# Patient Record
Sex: Male | Born: 1955
Health system: Southern US, Community
[De-identification: ages and names within clinical notes are randomized; demographics above are authoritative.]

## PROBLEM LIST (undated history)

## (undated) DIAGNOSIS — E538 Deficiency of other specified B group vitamins: Secondary | ICD-10-CM

## (undated) DIAGNOSIS — Z992 Dependence on renal dialysis: Secondary | ICD-10-CM

## (undated) DIAGNOSIS — E559 Vitamin D deficiency, unspecified: Secondary | ICD-10-CM

## (undated) DIAGNOSIS — R112 Nausea with vomiting, unspecified: Secondary | ICD-10-CM

## (undated) DIAGNOSIS — D649 Anemia, unspecified: Secondary | ICD-10-CM

## (undated) DIAGNOSIS — Z9889 Other specified postprocedural states: Secondary | ICD-10-CM

## (undated) DIAGNOSIS — N186 End stage renal disease: Secondary | ICD-10-CM

## (undated) DIAGNOSIS — R251 Tremor, unspecified: Secondary | ICD-10-CM

## (undated) DIAGNOSIS — M5136 Other intervertebral disc degeneration, lumbar region: Secondary | ICD-10-CM

## (undated) DIAGNOSIS — E7800 Pure hypercholesterolemia, unspecified: Secondary | ICD-10-CM

## (undated) DIAGNOSIS — K649 Unspecified hemorrhoids: Secondary | ICD-10-CM

## (undated) DIAGNOSIS — M1990 Unspecified osteoarthritis, unspecified site: Secondary | ICD-10-CM

## (undated) DIAGNOSIS — G0490 Encephalitis and encephalomyelitis, unspecified: Secondary | ICD-10-CM

## (undated) DIAGNOSIS — I10 Essential (primary) hypertension: Secondary | ICD-10-CM

## (undated) DIAGNOSIS — K219 Gastro-esophageal reflux disease without esophagitis: Secondary | ICD-10-CM

## (undated) DIAGNOSIS — D126 Benign neoplasm of colon, unspecified: Secondary | ICD-10-CM

## (undated) DIAGNOSIS — M51369 Other intervertebral disc degeneration, lumbar region without mention of lumbar back pain or lower extremity pain: Secondary | ICD-10-CM

## (undated) DIAGNOSIS — N189 Chronic kidney disease, unspecified: Secondary | ICD-10-CM

## (undated) DIAGNOSIS — E875 Hyperkalemia: Secondary | ICD-10-CM

## (undated) DIAGNOSIS — G809 Cerebral palsy, unspecified: Secondary | ICD-10-CM

## (undated) HISTORY — PX: COLONOSCOPY: SHX174

## (undated) HISTORY — PX: FRACTURE SURGERY: SHX138

## (undated) HISTORY — PX: ESOPHAGOGASTRODUODENOSCOPY: SHX1529

---

## 1995-05-28 HISTORY — PX: FRACTURE SURGERY: SHX138

## 2010-04-13 ENCOUNTER — Ambulatory Visit: Payer: Self-pay | Admitting: Unknown Physician Specialty

## 2010-04-17 LAB — PATHOLOGY REPORT

## 2013-03-01 ENCOUNTER — Ambulatory Visit: Payer: Self-pay | Admitting: Nephrology

## 2013-12-30 DIAGNOSIS — R251 Tremor, unspecified: Secondary | ICD-10-CM | POA: Insufficient documentation

## 2013-12-30 DIAGNOSIS — I10 Essential (primary) hypertension: Secondary | ICD-10-CM | POA: Insufficient documentation

## 2014-07-08 DIAGNOSIS — R809 Proteinuria, unspecified: Secondary | ICD-10-CM | POA: Diagnosis not present

## 2014-07-08 DIAGNOSIS — N183 Chronic kidney disease, stage 3 (moderate): Secondary | ICD-10-CM | POA: Diagnosis not present

## 2014-07-08 DIAGNOSIS — K219 Gastro-esophageal reflux disease without esophagitis: Secondary | ICD-10-CM | POA: Diagnosis not present

## 2014-07-08 DIAGNOSIS — I10 Essential (primary) hypertension: Secondary | ICD-10-CM | POA: Diagnosis not present

## 2014-07-08 DIAGNOSIS — E559 Vitamin D deficiency, unspecified: Secondary | ICD-10-CM | POA: Diagnosis not present

## 2014-07-19 DIAGNOSIS — H4011X3 Primary open-angle glaucoma, severe stage: Secondary | ICD-10-CM | POA: Diagnosis not present

## 2014-09-09 DIAGNOSIS — I10 Essential (primary) hypertension: Secondary | ICD-10-CM | POA: Diagnosis not present

## 2014-09-09 DIAGNOSIS — R809 Proteinuria, unspecified: Secondary | ICD-10-CM | POA: Diagnosis not present

## 2014-09-09 DIAGNOSIS — N183 Chronic kidney disease, stage 3 (moderate): Secondary | ICD-10-CM | POA: Diagnosis not present

## 2015-01-02 DIAGNOSIS — I10 Essential (primary) hypertension: Secondary | ICD-10-CM | POA: Diagnosis not present

## 2015-01-02 DIAGNOSIS — E559 Vitamin D deficiency, unspecified: Secondary | ICD-10-CM | POA: Diagnosis not present

## 2015-01-02 DIAGNOSIS — R251 Tremor, unspecified: Secondary | ICD-10-CM | POA: Diagnosis not present

## 2015-01-02 DIAGNOSIS — N183 Chronic kidney disease, stage 3 (moderate): Secondary | ICD-10-CM | POA: Diagnosis not present

## 2015-02-10 DIAGNOSIS — H4011X3 Primary open-angle glaucoma, severe stage: Secondary | ICD-10-CM | POA: Diagnosis not present

## 2015-03-10 DIAGNOSIS — N183 Chronic kidney disease, stage 3 (moderate): Secondary | ICD-10-CM | POA: Diagnosis not present

## 2015-03-10 DIAGNOSIS — I10 Essential (primary) hypertension: Secondary | ICD-10-CM | POA: Diagnosis not present

## 2015-03-10 DIAGNOSIS — R809 Proteinuria, unspecified: Secondary | ICD-10-CM | POA: Diagnosis not present

## 2015-04-28 DIAGNOSIS — Z800 Family history of malignant neoplasm of digestive organs: Secondary | ICD-10-CM | POA: Diagnosis not present

## 2015-05-24 ENCOUNTER — Encounter: Payer: Self-pay | Admitting: *Deleted

## 2015-05-25 ENCOUNTER — Ambulatory Visit: Payer: Commercial Managed Care - HMO | Admitting: Anesthesiology

## 2015-05-25 ENCOUNTER — Ambulatory Visit
Admission: RE | Admit: 2015-05-25 | Discharge: 2015-05-25 | Disposition: A | Discharge status: Home / Self Care | Payer: Commercial Managed Care - HMO | Source: Ambulatory Visit | Attending: Unknown Physician Specialty | Admitting: Unknown Physician Specialty

## 2015-05-25 ENCOUNTER — Encounter: Payer: Self-pay | Admitting: Anesthesiology

## 2015-05-25 ENCOUNTER — Encounter
Admission: RE | Disposition: A | Discharge status: Home / Self Care | Payer: Self-pay | Source: Ambulatory Visit | Attending: Unknown Physician Specialty

## 2015-05-25 DIAGNOSIS — Z800 Family history of malignant neoplasm of digestive organs: Secondary | ICD-10-CM | POA: Diagnosis not present

## 2015-05-25 DIAGNOSIS — K648 Other hemorrhoids: Secondary | ICD-10-CM | POA: Diagnosis not present

## 2015-05-25 DIAGNOSIS — K219 Gastro-esophageal reflux disease without esophagitis: Secondary | ICD-10-CM | POA: Insufficient documentation

## 2015-05-25 DIAGNOSIS — K5730 Diverticulosis of large intestine without perforation or abscess without bleeding: Secondary | ICD-10-CM | POA: Diagnosis not present

## 2015-05-25 DIAGNOSIS — Z1211 Encounter for screening for malignant neoplasm of colon: Secondary | ICD-10-CM | POA: Diagnosis not present

## 2015-05-25 DIAGNOSIS — I129 Hypertensive chronic kidney disease with stage 1 through stage 4 chronic kidney disease, or unspecified chronic kidney disease: Secondary | ICD-10-CM | POA: Diagnosis not present

## 2015-05-25 DIAGNOSIS — M1990 Unspecified osteoarthritis, unspecified site: Secondary | ICD-10-CM | POA: Insufficient documentation

## 2015-05-25 DIAGNOSIS — Z79899 Other long term (current) drug therapy: Secondary | ICD-10-CM | POA: Insufficient documentation

## 2015-05-25 DIAGNOSIS — Z7951 Long term (current) use of inhaled steroids: Secondary | ICD-10-CM | POA: Insufficient documentation

## 2015-05-25 DIAGNOSIS — G809 Cerebral palsy, unspecified: Secondary | ICD-10-CM | POA: Diagnosis not present

## 2015-05-25 DIAGNOSIS — K635 Polyp of colon: Secondary | ICD-10-CM | POA: Diagnosis not present

## 2015-05-25 DIAGNOSIS — D123 Benign neoplasm of transverse colon: Secondary | ICD-10-CM | POA: Diagnosis not present

## 2015-05-25 DIAGNOSIS — K640 First degree hemorrhoids: Secondary | ICD-10-CM | POA: Insufficient documentation

## 2015-05-25 DIAGNOSIS — E7800 Pure hypercholesterolemia, unspecified: Secondary | ICD-10-CM | POA: Insufficient documentation

## 2015-05-25 DIAGNOSIS — K5790 Diverticulosis of intestine, part unspecified, without perforation or abscess without bleeding: Secondary | ICD-10-CM | POA: Diagnosis not present

## 2015-05-25 HISTORY — DX: Encephalitis and encephalomyelitis, unspecified: G04.90

## 2015-05-25 HISTORY — DX: Deficiency of other specified B group vitamins: E53.8

## 2015-05-25 HISTORY — DX: Chronic kidney disease, unspecified: N18.9

## 2015-05-25 HISTORY — DX: Pure hypercholesterolemia, unspecified: E78.00

## 2015-05-25 HISTORY — DX: Essential (primary) hypertension: I10

## 2015-05-25 HISTORY — DX: Gastro-esophageal reflux disease without esophagitis: K21.9

## 2015-05-25 HISTORY — DX: Unspecified osteoarthritis, unspecified site: M19.90

## 2015-05-25 HISTORY — PX: COLONOSCOPY WITH PROPOFOL: SHX5780

## 2015-05-25 HISTORY — DX: Cerebral palsy, unspecified: G80.9

## 2015-05-25 HISTORY — DX: Unspecified hemorrhoids: K64.9

## 2015-05-25 SURGERY — COLONOSCOPY WITH PROPOFOL
Anesthesia: General

## 2015-05-25 MED ORDER — SODIUM CHLORIDE 0.9 % IV SOLN
INTRAVENOUS | Status: DC
  Administered 2015-05-25: 10:00:00 via INTRAVENOUS

## 2015-05-25 MED ORDER — FENTANYL CITRATE (PF) 100 MCG/2ML IJ SOLN
INTRAMUSCULAR | Status: DC | PRN
  Administered 2015-05-25: 50 ug via INTRAVENOUS

## 2015-05-25 MED ORDER — SODIUM CHLORIDE 0.9 % IV SOLN
INTRAVENOUS | Status: DC
  Administered 2015-05-25: 1000 mL via INTRAVENOUS

## 2015-05-25 MED ORDER — MIDAZOLAM HCL 2 MG/2ML IJ SOLN
INTRAMUSCULAR | Status: DC | PRN
  Administered 2015-05-25: 1 mg via INTRAVENOUS

## 2015-05-25 MED ORDER — EPHEDRINE SULFATE 50 MG/ML IJ SOLN
INTRAMUSCULAR | Status: DC | PRN
  Administered 2015-05-25 (×2): 5 mg via INTRAVENOUS

## 2015-05-25 MED ORDER — PROPOFOL 500 MG/50ML IV EMUL
INTRAVENOUS | Status: DC | PRN
  Administered 2015-05-25: 120 ug/kg/min via INTRAVENOUS

## 2015-05-25 NOTE — H&P (Signed)
   Primary Care Physician:  Ezequiel Kayser, MD Primary Gastroenterologist:  Dr. Vira Agar  Pre-Procedure History & Physical: HPI:  James Black is a 59 y.o. male is here for an colonoscopy.   Past Medical History  Diagnosis Date  . Cerebral palsy ([place])   . Chronic kidney disease   . Arthritis   . Hypertension   . GERD (gastroesophageal reflux disease)   . Hypercholesterolemia   . Hemorrhoid   . Encephalitis   . Deficiency of vitamin B12     Past Surgical History  Procedure Laterality Date  . Fracture surgery      Prior to Admission medications   Medication Sig Start Date End Date Taking? Authorizing Provider  Cholecalciferol 5000 units TABS Take 5,000 Units by mouth once a week.   Yes Historical Provider, MD  esomeprazole (NEXIUM) 40 MG capsule Take 40 mg by mouth daily at 12 noon.   Yes Historical Provider, MD  fluticasone (FLONASE) 50 MCG/ACT nasal spray Place 2 sprays into both nostrils daily as needed for allergies or rhinitis.   Yes Historical Provider, MD  lisinopril (PRINIVIL,ZESTRIL) 10 MG tablet Take 10 mg by mouth daily.   Yes Historical Provider, MD  metoCLOPramide (REGLAN) 10 MG tablet Take 10 mg by mouth 2 (two) times daily with breakfast and lunch.   Yes Historical Provider, MD  timolol (TIMOPTIC-XR) 0.5 % ophthalmic gel-forming Place 1 drop into both eyes 2 (two) times daily.   Yes Historical Provider, MD  Travoprost, BAK Free, (TRAVATAN) 0.004 % SOLN ophthalmic solution Place 1 drop into both eyes at bedtime.   Yes Historical Provider, MD    Allergies as of 05/02/2015  . (Not on File)    History reviewed. No pertinent family history.  Social History   Social History  . Marital Status: Single    Spouse Name: N/A  . Number of Children: N/A  . Years of Education: N/A   Occupational History  . Not on file.   Social History Main Topics  . Smoking status: Never Smoker   . Smokeless tobacco: Never Used  . Alcohol Use: No  . Drug Use: No  . Sexual  Activity: Not on file   Other Topics Concern  . Not on file   Social History Narrative  . No narrative on file    Review of Systems: See HPI, otherwise negative ROS  Physical Exam: Pulse 65  Temp(Src) 96.7 F (35.9 C) (Tympanic)  Resp 16  Ht 5\' 4"  (1.626 m)  Wt 56.7 kg (125 lb)  BMI 21.45 kg/m2  SpO2 100% General:   Alert,  pleasant and cooperative in NAD Head:  Normocephalic and atraumatic. Neck:  Supple; no masses or thyromegaly. Lungs:  Clear throughout to auscultation.    Heart:  Regular rate and rhythm. Abdomen:  Soft, nontender and nondistended. Normal bowel sounds, without guarding, and without rebound.   Neurologic:  Alert and  oriented x4;  grossly normal neurologically.  Impression/Plan: James Black is here for an colonoscopy to be performed for FH colon cancer  Risks, benefits, limitations, and alternatives regarding  colonoscopy have been reviewed with the patient.  Questions have been answered.  All parties agreeable.   Gaylyn Cheers, MD  05/25/2015, 10:48 AM

## 2015-05-25 NOTE — Anesthesia Procedure Notes (Signed)
Performed by: COOK-MARTIN, Archita Lomeli Pre-anesthesia Checklist: Patient identified, Emergency Drugs available, Suction available, Patient being monitored and Timeout performed Patient Re-evaluated:Patient Re-evaluated prior to inductionOxygen Delivery Method: Nasal cannula Preoxygenation: Pre-oxygenation with 100% oxygen Intubation Type: IV induction Placement Confirmation: positive ETCO2 and CO2 detector       

## 2015-05-25 NOTE — Anesthesia Preprocedure Evaluation (Signed)
Anesthesia Evaluation  Patient identified by MRN, date of birth, ID band Patient awake    Reviewed: Allergy & Precautions, H&P , NPO status , Patient's Chart, lab work & pertinent test results, reviewed documented beta blocker date and time   Airway Mallampati: II  TM Distance: >3 FB Neck ROM: full    Dental no notable dental hx.    Pulmonary neg pulmonary ROS,    Pulmonary exam normal breath sounds clear to auscultation       Cardiovascular Exercise Tolerance: Good hypertension, negative cardio ROS   Rhythm:regular Rate:Normal     Neuro/Psych negative neurological ROS  negative psych ROS   GI/Hepatic negative GI ROS, Neg liver ROS,   Endo/Other  negative endocrine ROS  Renal/GU Renal diseasenegative Renal ROS  negative genitourinary   Musculoskeletal   Abdominal   Peds  Hematology negative hematology ROS (+)   Anesthesia Other Findings   Reproductive/Obstetrics negative OB ROS                             Anesthesia Physical Anesthesia Plan  ASA: III  Anesthesia Plan: General   Post-op Pain Management:    Induction:   Airway Management Planned:   Additional Equipment:   Intra-op Plan:   Post-operative Plan:   Informed Consent: I have reviewed the patients History and Physical, chart, labs and discussed the procedure including the risks, benefits and alternatives for the proposed anesthesia with the patient or authorized representative who has indicated his/her understanding and acceptance.   Dental Advisory Given  Plan Discussed with: CRNA  Anesthesia Plan Comments:         Anesthesia Quick Evaluation

## 2015-05-25 NOTE — Op Note (Signed)
[facility] Gastroenterology Patient Name: James Black Procedure Date: 05/25/2015 10:51 AM MRN: [id number] Account #: [id number] Date of Birth: 07/05/1955 Admit Type: Outpatient Age: 59 Room: [facility] ENDO ROOM 1 Gender: Male Note Status: Finalized Procedure:         Colonoscopy Indications:       Screening in patient at increased risk: Family history of                     1st-degree relative with colorectal cancer Providers:         Manya Silvas, MD Referring MD:      Christena Flake. Raechel Ache, MD (Referring MD) Medicines:         Propofol per Anesthesia Complications:     No immediate complications. Procedure:         Pre-Anesthesia Assessment:                    - After reviewing the risks and benefits, the patient was                     deemed in satisfactory condition to undergo the procedure.                    After obtaining informed consent, the colonoscope was                     passed under direct vision. Throughout the procedure, the                     patient's blood pressure, pulse, and oxygen saturations                     were monitored continuously. The Colonoscope was                     introduced through the anus and advanced to the the cecum,                     identified by appendiceal orifice and ileocecal valve. The                     colonoscopy was performed without difficulty. The patient                     tolerated the procedure well. The quality of the bowel                     preparation was good. Findings:      A diminutive polyp was found in the transverse colon. The polyp was       sessile. The polyp was removed with a jumbo cold forceps. Resection and       retrieval were complete.      Internal hemorrhoids were found during endoscopy. The hemorrhoids were       medium-sized and Grade I (internal hemorrhoids that do not prolapse).      The exam was otherwise without abnormality.      A single small-mouthed diverticulum was  found in the descending colon. Impression:        - One diminutive polyp in the transverse colon. Resected                     and retrieved.                    -  Internal hemorrhoids.                    - The examination was otherwise normal. Recommendation:    - Await pathology results. Manya Silvas, MD 05/25/2015 11:19:35 AM This report has been signed electronically. Number of Addenda: 0 Note Initiated On: 05/25/2015 10:51 AM Scope Withdrawal Time: 0 hours 8 minutes 55 seconds  Total Procedure Duration: 0 hours 12 minutes 28 seconds       [facility]

## 2015-05-25 NOTE — Transfer of Care (Signed)
Immediate Anesthesia Transfer of Care Note  Patient: James Black  Procedure(s) Performed: Procedure(s): COLONOSCOPY WITH PROPOFOL (N/A)  Patient Location: PACU  Anesthesia Type:General  Level of Consciousness: awake and sedated  Airway & Oxygen Therapy: Patient Spontanous Breathing and Patient connected to nasal cannula oxygen  Post-op Assessment: Report given to RN and Post -op Vital signs reviewed and stable  Post vital signs: Reviewed and stable  Last Vitals:  Filed Vitals:   05/25/15 1015 05/25/15 1118  Pulse: 65   Temp: 35.9 C 36.3 C  Resp: 16     Complications: No apparent anesthesia complications

## 2015-05-26 LAB — SURGICAL PATHOLOGY

## 2015-05-28 NOTE — Anesthesia Postprocedure Evaluation (Signed)
Anesthesia Post Note  Patient: James Black  Procedure(s) Performed: Procedure(s) (LRB): COLONOSCOPY WITH PROPOFOL (N/A)  Patient location during evaluation: PACU Anesthesia Type: General Level of consciousness: awake and alert Pain management: pain level controlled Vital Signs Assessment: post-procedure vital signs reviewed and stable Respiratory status: spontaneous breathing, nonlabored ventilation, respiratory function stable and patient connected to nasal cannula oxygen Cardiovascular status: blood pressure returned to baseline and stable Postop Assessment: no signs of nausea or vomiting Anesthetic complications: no    Last Vitals:  Filed Vitals:   05/25/15 1140 05/25/15 1150  BP: 100/85 105/80  Pulse: 87 79  Temp:    Resp: 15 12    Last Pain: There were no vitals filed for this visit.               Molli Barrows

## 2015-05-30 ENCOUNTER — Encounter: Payer: Self-pay | Admitting: Unknown Physician Specialty

## 2015-07-06 DIAGNOSIS — Z6839 Body mass index (BMI) 39.0-39.9, adult: Secondary | ICD-10-CM | POA: Insufficient documentation

## 2015-07-06 DIAGNOSIS — I10 Essential (primary) hypertension: Secondary | ICD-10-CM | POA: Diagnosis not present

## 2015-07-06 DIAGNOSIS — K219 Gastro-esophageal reflux disease without esophagitis: Secondary | ICD-10-CM | POA: Diagnosis not present

## 2015-07-06 DIAGNOSIS — E559 Vitamin D deficiency, unspecified: Secondary | ICD-10-CM | POA: Diagnosis not present

## 2015-07-06 DIAGNOSIS — R809 Proteinuria, unspecified: Secondary | ICD-10-CM | POA: Diagnosis not present

## 2015-07-06 DIAGNOSIS — J309 Allergic rhinitis, unspecified: Secondary | ICD-10-CM | POA: Diagnosis not present

## 2015-07-06 DIAGNOSIS — Z79899 Other long term (current) drug therapy: Secondary | ICD-10-CM | POA: Diagnosis not present

## 2015-07-06 DIAGNOSIS — J300 Vasomotor rhinitis: Secondary | ICD-10-CM | POA: Diagnosis not present

## 2015-07-06 DIAGNOSIS — N183 Chronic kidney disease, stage 3 (moderate): Secondary | ICD-10-CM | POA: Diagnosis not present

## 2015-07-06 DIAGNOSIS — E7800 Pure hypercholesterolemia, unspecified: Secondary | ICD-10-CM | POA: Diagnosis not present

## 2015-07-14 DIAGNOSIS — Z79899 Other long term (current) drug therapy: Secondary | ICD-10-CM | POA: Diagnosis not present

## 2015-07-14 DIAGNOSIS — E7800 Pure hypercholesterolemia, unspecified: Secondary | ICD-10-CM | POA: Diagnosis not present

## 2015-08-07 DIAGNOSIS — I10 Essential (primary) hypertension: Secondary | ICD-10-CM | POA: Diagnosis not present

## 2015-08-07 DIAGNOSIS — M47816 Spondylosis without myelopathy or radiculopathy, lumbar region: Secondary | ICD-10-CM | POA: Diagnosis not present

## 2015-08-07 DIAGNOSIS — M5442 Lumbago with sciatica, left side: Secondary | ICD-10-CM | POA: Diagnosis not present

## 2015-08-07 DIAGNOSIS — N183 Chronic kidney disease, stage 3 (moderate): Secondary | ICD-10-CM | POA: Diagnosis not present

## 2015-08-07 DIAGNOSIS — M5441 Lumbago with sciatica, right side: Secondary | ICD-10-CM | POA: Diagnosis not present

## 2015-08-09 DIAGNOSIS — H401123 Primary open-angle glaucoma, left eye, severe stage: Secondary | ICD-10-CM | POA: Diagnosis not present

## 2015-08-11 DIAGNOSIS — H401133 Primary open-angle glaucoma, bilateral, severe stage: Secondary | ICD-10-CM | POA: Diagnosis not present

## 2015-09-15 DIAGNOSIS — I10 Essential (primary) hypertension: Secondary | ICD-10-CM | POA: Diagnosis not present

## 2015-09-15 DIAGNOSIS — R809 Proteinuria, unspecified: Secondary | ICD-10-CM | POA: Diagnosis not present

## 2015-09-15 DIAGNOSIS — N183 Chronic kidney disease, stage 3 (moderate): Secondary | ICD-10-CM | POA: Diagnosis not present

## 2015-09-20 IMAGING — CT CT ABD-PELV W/O CM
1 of 2 series · 15 of 32 positions shown, 19 images · non-contrast
Comparison: None

REASON FOR EXAM: left kidney mass seen on kidney  WO due to Kidney
Function test
COMMENTS:

PROCEDURE:     BLAIN - BLAIN ABDOMEN AND PELVIS WO  - March 01, 2013 [DATE]
RESULT:     Indication: Left renal mass seen on outside ultrasound.
Ultrasound is not presented for comparison.
TECHNIQUE: Multiple axial images from the lung bases to the symphysis pubis
were obtained without oral and without intravenous contrast.

[Series 2: soft tissue · axial · 0.67mm/px · z∈[-479,-56]mm · 15 of 155 slices shown, 19 images]
[im 7/155  soft-tissue]
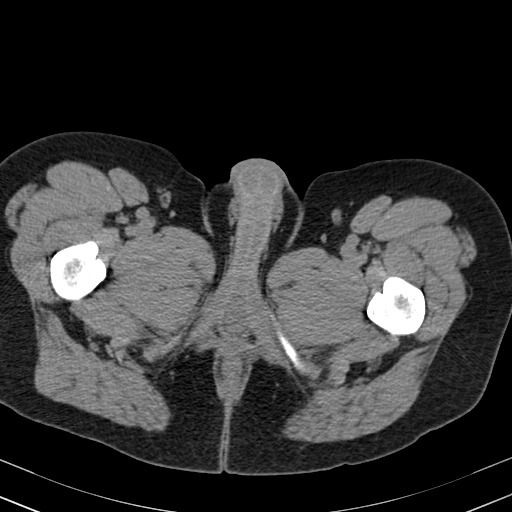
[im 7/155  bone]
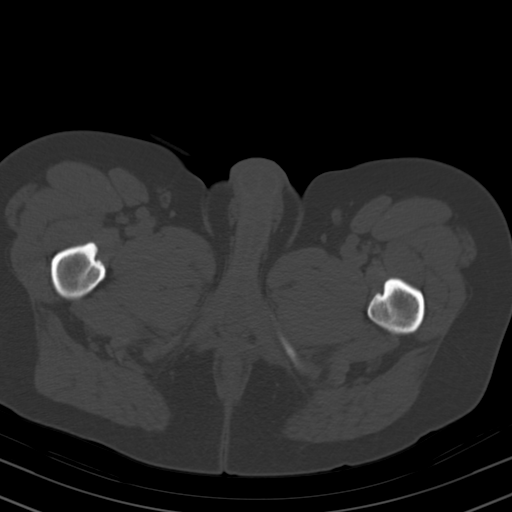
[im 20/155  soft-tissue]
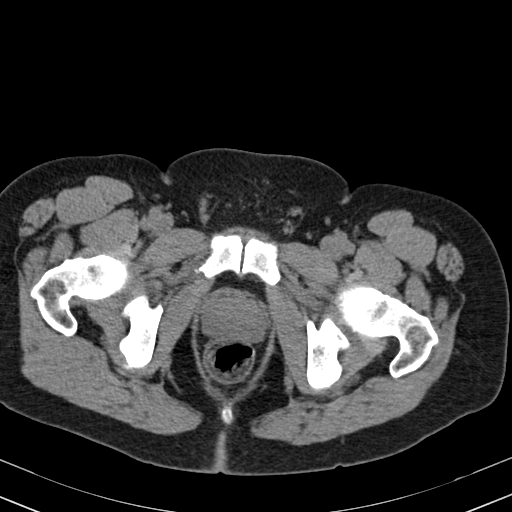
[im 33/155  soft-tissue]
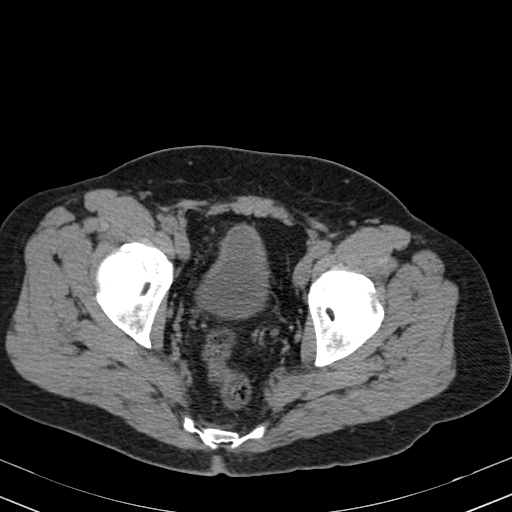
[im 45/155  soft-tissue]
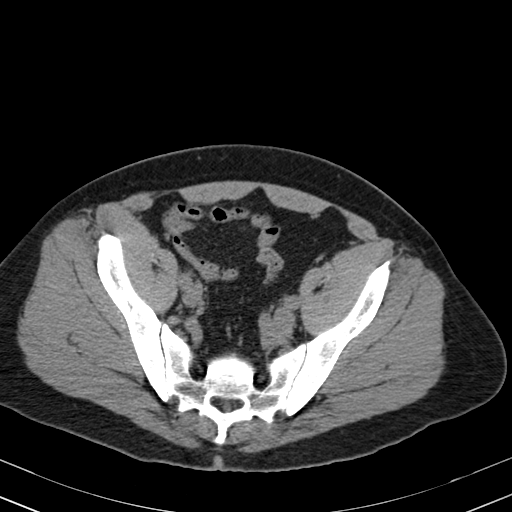
[im 52/155  soft-tissue]
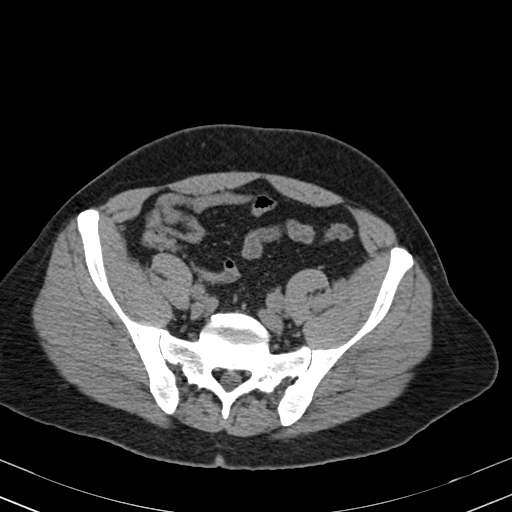
[im 65/155  soft-tissue]
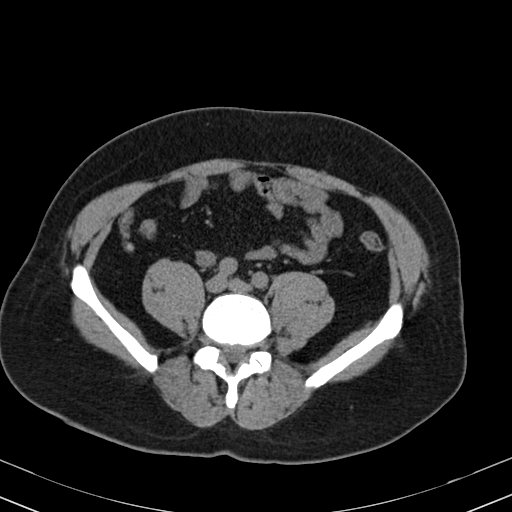
[im 78/155  soft-tissue]
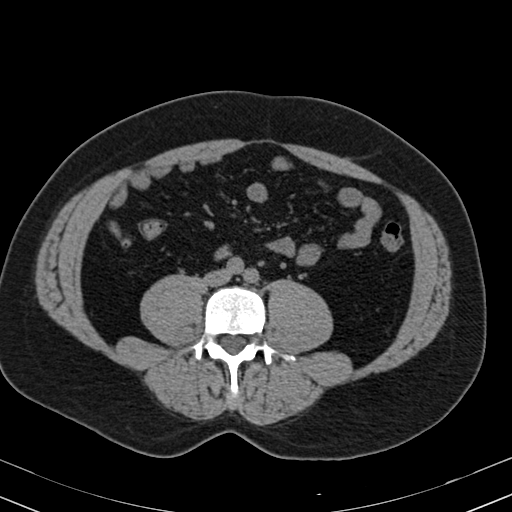
[im 90/155  soft-tissue]
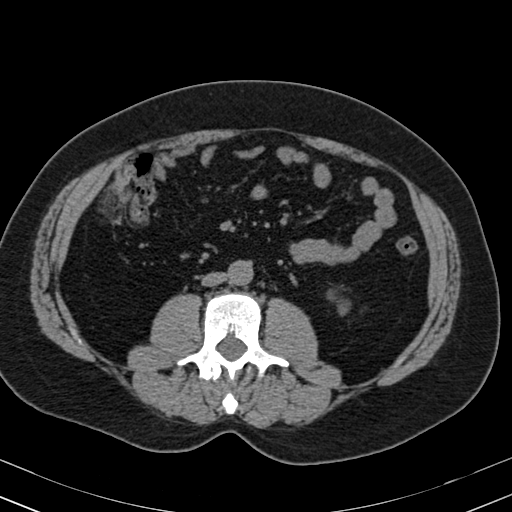
[im 103/155  soft-tissue]
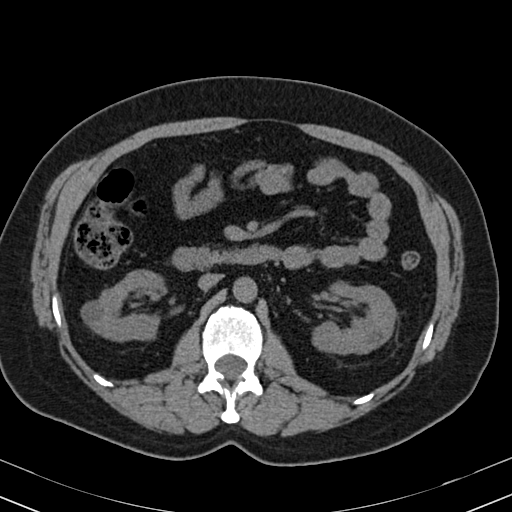
[im 103/155  bone]
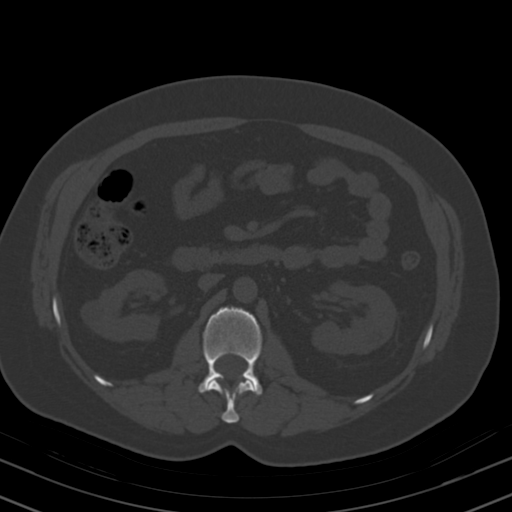
[im 110/155  soft-tissue]
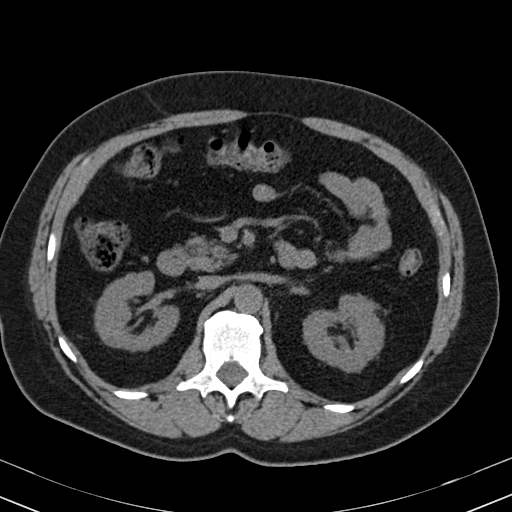
[im 122/155  soft-tissue]
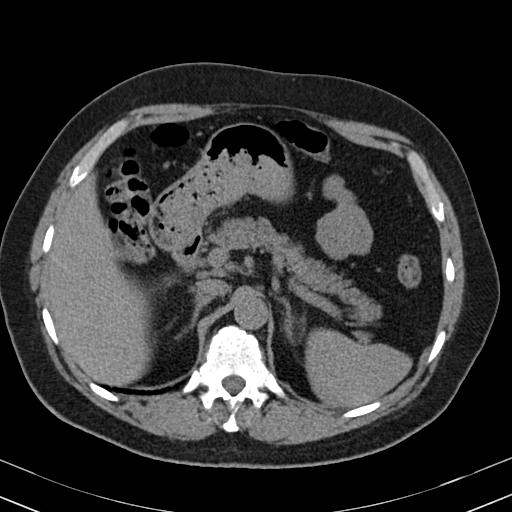
[im 129/155  lung]
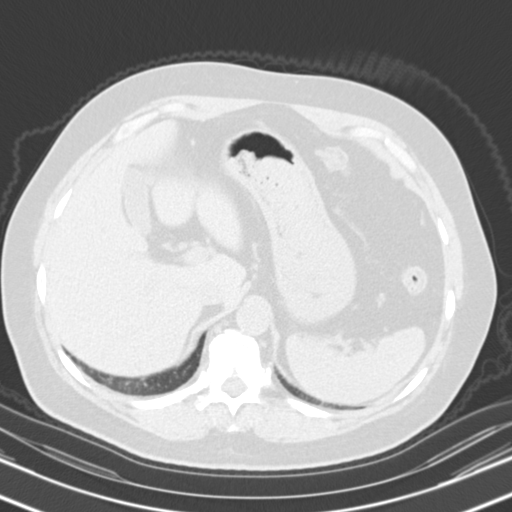
[im 135/155  soft-tissue]
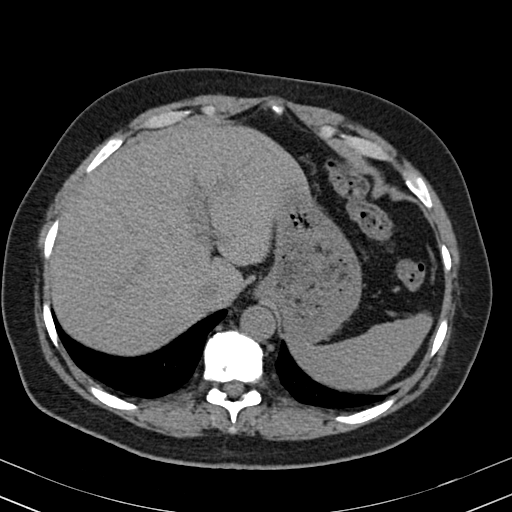
[im 135/155  lung]
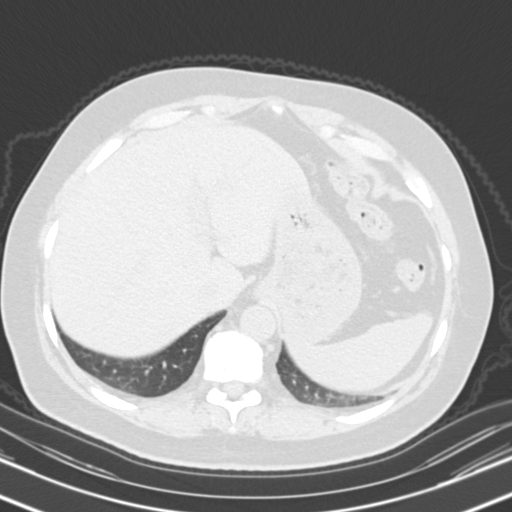
[im 142/155  lung]
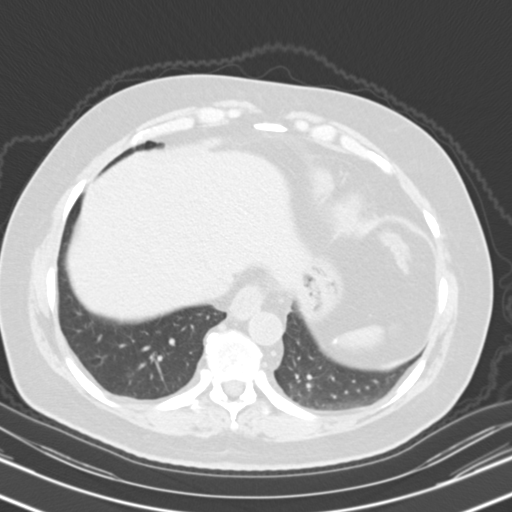
[im 148/155  soft-tissue]
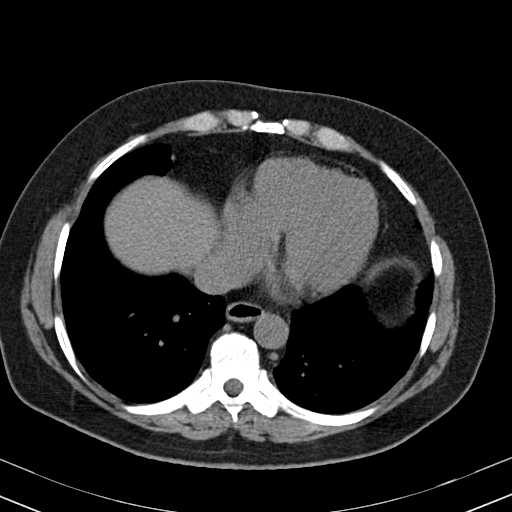
[im 148/155  lung]
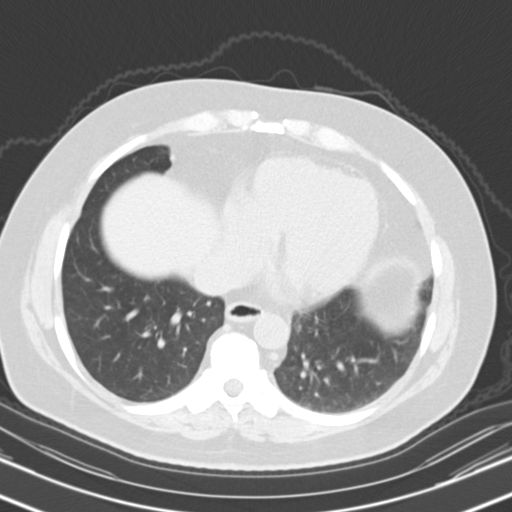

[15 of 32 positions shown; findings below may reference images not displayed]

FINDINGS: The lung bases are clear. There is no pleural or pericardial effusions.

No renal, ureteral, or bladder calculi. No obstructive uropathy. No
perinephric stranding is seen. There is a 16mm right interpolar renal mass
measuring fluid attenuation most consistent with a cyst. There is a 3 cm
hypodense, fluid attenuating left interpolar mass  most consistent with a
cyst. There is a 15 mm hypodense, fluid attenuating left posterior lateral
interpolar mass most consistent with a cyst. The bladder is unremarkable.

The liver demonstrates no focal abnormality. The gallbladder is
unremarkable. The spleen demonstrates no focal abnormality. The adrenal
glands and pancreas are normal.

The unopacified stomach, duodenum, small intestine, and large intestine are
unremarkable, but evaluation is limited by lack of oral contrast.  There is
no pneumoperitoneum, pneumatosis, or portal venous gas. There is no
abdominal or pelvic free fluid. There is no lymphadenopathy.

The abdominal aorta is normal in caliber with atherosclerosis.

The osseous structures are unremarkable.
IMPRESSION: 1. No urolithiasis or obstructive uropathy.

2. Bilateral renal cysts.

[REDACTED]

## 2016-01-02 DIAGNOSIS — K219 Gastro-esophageal reflux disease without esophagitis: Secondary | ICD-10-CM | POA: Diagnosis not present

## 2016-01-02 DIAGNOSIS — J300 Vasomotor rhinitis: Secondary | ICD-10-CM | POA: Diagnosis not present

## 2016-01-02 DIAGNOSIS — I10 Essential (primary) hypertension: Secondary | ICD-10-CM | POA: Diagnosis not present

## 2016-01-02 DIAGNOSIS — N183 Chronic kidney disease, stage 3 (moderate): Secondary | ICD-10-CM | POA: Diagnosis not present

## 2016-01-02 DIAGNOSIS — E559 Vitamin D deficiency, unspecified: Secondary | ICD-10-CM | POA: Diagnosis not present

## 2016-01-02 DIAGNOSIS — L723 Sebaceous cyst: Secondary | ICD-10-CM | POA: Diagnosis not present

## 2016-01-02 DIAGNOSIS — J309 Allergic rhinitis, unspecified: Secondary | ICD-10-CM | POA: Diagnosis not present

## 2016-02-09 DIAGNOSIS — H401123 Primary open-angle glaucoma, left eye, severe stage: Secondary | ICD-10-CM | POA: Diagnosis not present

## 2016-03-15 DIAGNOSIS — I10 Essential (primary) hypertension: Secondary | ICD-10-CM | POA: Diagnosis not present

## 2016-03-15 DIAGNOSIS — N183 Chronic kidney disease, stage 3 (moderate): Secondary | ICD-10-CM | POA: Diagnosis not present

## 2016-03-15 DIAGNOSIS — R809 Proteinuria, unspecified: Secondary | ICD-10-CM | POA: Diagnosis not present

## 2016-03-21 DIAGNOSIS — N183 Chronic kidney disease, stage 3 (moderate): Secondary | ICD-10-CM | POA: Diagnosis not present

## 2016-03-21 DIAGNOSIS — R809 Proteinuria, unspecified: Secondary | ICD-10-CM | POA: Diagnosis not present

## 2016-03-21 DIAGNOSIS — I10 Essential (primary) hypertension: Secondary | ICD-10-CM | POA: Diagnosis not present

## 2016-04-12 DIAGNOSIS — N183 Chronic kidney disease, stage 3 (moderate): Secondary | ICD-10-CM | POA: Diagnosis not present

## 2016-04-12 DIAGNOSIS — M25572 Pain in left ankle and joints of left foot: Secondary | ICD-10-CM | POA: Diagnosis not present

## 2016-04-25 DIAGNOSIS — M1A30X0 Chronic gout due to renal impairment, unspecified site, without tophus (tophi): Secondary | ICD-10-CM | POA: Insufficient documentation

## 2016-07-04 DIAGNOSIS — J309 Allergic rhinitis, unspecified: Secondary | ICD-10-CM | POA: Diagnosis not present

## 2016-07-04 DIAGNOSIS — M25572 Pain in left ankle and joints of left foot: Secondary | ICD-10-CM | POA: Diagnosis not present

## 2016-07-04 DIAGNOSIS — G8929 Other chronic pain: Secondary | ICD-10-CM | POA: Diagnosis not present

## 2016-07-04 DIAGNOSIS — N183 Chronic kidney disease, stage 3 (moderate): Secondary | ICD-10-CM | POA: Diagnosis not present

## 2016-07-04 DIAGNOSIS — M1A30X0 Chronic gout due to renal impairment, unspecified site, without tophus (tophi): Secondary | ICD-10-CM | POA: Diagnosis not present

## 2016-07-04 DIAGNOSIS — K219 Gastro-esophageal reflux disease without esophagitis: Secondary | ICD-10-CM | POA: Diagnosis not present

## 2016-07-04 DIAGNOSIS — G809 Cerebral palsy, unspecified: Secondary | ICD-10-CM | POA: Diagnosis not present

## 2016-07-04 DIAGNOSIS — E559 Vitamin D deficiency, unspecified: Secondary | ICD-10-CM | POA: Diagnosis not present

## 2016-07-04 DIAGNOSIS — I10 Essential (primary) hypertension: Secondary | ICD-10-CM | POA: Diagnosis not present

## 2016-07-08 DIAGNOSIS — M25572 Pain in left ankle and joints of left foot: Secondary | ICD-10-CM | POA: Diagnosis not present

## 2016-07-08 DIAGNOSIS — M19072 Primary osteoarthritis, left ankle and foot: Secondary | ICD-10-CM | POA: Diagnosis not present

## 2016-07-15 DIAGNOSIS — R809 Proteinuria, unspecified: Secondary | ICD-10-CM | POA: Diagnosis not present

## 2016-07-15 DIAGNOSIS — N183 Chronic kidney disease, stage 3 (moderate): Secondary | ICD-10-CM | POA: Diagnosis not present

## 2016-07-15 DIAGNOSIS — I10 Essential (primary) hypertension: Secondary | ICD-10-CM | POA: Diagnosis not present

## 2016-07-15 DIAGNOSIS — N2581 Secondary hyperparathyroidism of renal origin: Secondary | ICD-10-CM | POA: Diagnosis not present

## 2016-07-15 DIAGNOSIS — I129 Hypertensive chronic kidney disease with stage 1 through stage 4 chronic kidney disease, or unspecified chronic kidney disease: Secondary | ICD-10-CM | POA: Diagnosis not present

## 2016-08-01 DIAGNOSIS — I10 Essential (primary) hypertension: Secondary | ICD-10-CM | POA: Diagnosis not present

## 2016-08-01 DIAGNOSIS — R809 Proteinuria, unspecified: Secondary | ICD-10-CM | POA: Diagnosis not present

## 2016-08-01 DIAGNOSIS — N183 Chronic kidney disease, stage 3 (moderate): Secondary | ICD-10-CM | POA: Diagnosis not present

## 2016-08-09 ENCOUNTER — Other Ambulatory Visit: Payer: Self-pay | Admitting: Ophthalmology

## 2016-08-09 DIAGNOSIS — H469 Unspecified optic neuritis: Secondary | ICD-10-CM

## 2016-08-09 DIAGNOSIS — H401123 Primary open-angle glaucoma, left eye, severe stage: Secondary | ICD-10-CM | POA: Diagnosis not present

## 2016-08-21 ENCOUNTER — Ambulatory Visit
Admission: RE | Admit: 2016-08-21 | Discharge: 2016-08-21 | Disposition: A | Discharge status: Home / Self Care | Payer: Medicare HMO | Source: Ambulatory Visit | Attending: Ophthalmology | Admitting: Ophthalmology

## 2016-08-21 DIAGNOSIS — H469 Unspecified optic neuritis: Secondary | ICD-10-CM | POA: Diagnosis not present

## 2016-08-21 DIAGNOSIS — Z8673 Personal history of transient ischemic attack (TIA), and cerebral infarction without residual deficits: Secondary | ICD-10-CM | POA: Diagnosis not present

## 2016-08-21 DIAGNOSIS — I6782 Cerebral ischemia: Secondary | ICD-10-CM | POA: Diagnosis not present

## 2016-08-21 LAB — POCT I-STAT CREATININE: CREATININE: 2.9 mg/dL — AB (ref 0.61–1.24)

## 2016-08-21 MED ORDER — GADOBENATE DIMEGLUMINE 529 MG/ML IV SOLN
20.0000 mL | Freq: Once | INTRAVENOUS | Status: AC | PRN
  Administered 2016-08-21: 9 mL via INTRAVENOUS

## 2016-08-23 DIAGNOSIS — R809 Proteinuria, unspecified: Secondary | ICD-10-CM | POA: Diagnosis not present

## 2016-08-23 DIAGNOSIS — I10 Essential (primary) hypertension: Secondary | ICD-10-CM | POA: Diagnosis not present

## 2016-08-23 DIAGNOSIS — N183 Chronic kidney disease, stage 3 (moderate): Secondary | ICD-10-CM | POA: Diagnosis not present

## 2016-09-06 DIAGNOSIS — H401123 Primary open-angle glaucoma, left eye, severe stage: Secondary | ICD-10-CM | POA: Diagnosis not present

## 2017-01-03 DIAGNOSIS — I10 Essential (primary) hypertension: Secondary | ICD-10-CM | POA: Diagnosis not present

## 2017-01-03 DIAGNOSIS — G809 Cerebral palsy, unspecified: Secondary | ICD-10-CM | POA: Diagnosis not present

## 2017-01-03 DIAGNOSIS — J309 Allergic rhinitis, unspecified: Secondary | ICD-10-CM | POA: Diagnosis not present

## 2017-01-03 DIAGNOSIS — N183 Chronic kidney disease, stage 3 (moderate): Secondary | ICD-10-CM | POA: Diagnosis not present

## 2017-01-03 DIAGNOSIS — E7800 Pure hypercholesterolemia, unspecified: Secondary | ICD-10-CM | POA: Diagnosis not present

## 2017-01-03 DIAGNOSIS — Z0000 Encounter for general adult medical examination without abnormal findings: Secondary | ICD-10-CM | POA: Diagnosis not present

## 2017-01-03 DIAGNOSIS — E559 Vitamin D deficiency, unspecified: Secondary | ICD-10-CM | POA: Diagnosis not present

## 2017-01-03 DIAGNOSIS — K219 Gastro-esophageal reflux disease without esophagitis: Secondary | ICD-10-CM | POA: Diagnosis not present

## 2017-01-03 DIAGNOSIS — Z125 Encounter for screening for malignant neoplasm of prostate: Secondary | ICD-10-CM | POA: Diagnosis not present

## 2017-01-07 DIAGNOSIS — N183 Chronic kidney disease, stage 3 (moderate): Secondary | ICD-10-CM | POA: Diagnosis not present

## 2017-01-07 DIAGNOSIS — I10 Essential (primary) hypertension: Secondary | ICD-10-CM | POA: Diagnosis not present

## 2017-01-07 DIAGNOSIS — R809 Proteinuria, unspecified: Secondary | ICD-10-CM | POA: Diagnosis not present

## 2017-01-10 DIAGNOSIS — I639 Cerebral infarction, unspecified: Secondary | ICD-10-CM | POA: Diagnosis not present

## 2017-01-21 DIAGNOSIS — N183 Chronic kidney disease, stage 3 (moderate): Secondary | ICD-10-CM | POA: Diagnosis not present

## 2017-01-21 DIAGNOSIS — R809 Proteinuria, unspecified: Secondary | ICD-10-CM | POA: Diagnosis not present

## 2017-01-21 DIAGNOSIS — I10 Essential (primary) hypertension: Secondary | ICD-10-CM | POA: Diagnosis not present

## 2017-06-13 DIAGNOSIS — H401123 Primary open-angle glaucoma, left eye, severe stage: Secondary | ICD-10-CM | POA: Diagnosis not present

## 2017-07-08 DIAGNOSIS — G808 Other cerebral palsy: Secondary | ICD-10-CM | POA: Diagnosis not present

## 2017-07-08 DIAGNOSIS — K219 Gastro-esophageal reflux disease without esophagitis: Secondary | ICD-10-CM | POA: Diagnosis not present

## 2017-07-08 DIAGNOSIS — N183 Chronic kidney disease, stage 3 (moderate): Secondary | ICD-10-CM | POA: Diagnosis not present

## 2017-07-08 DIAGNOSIS — R251 Tremor, unspecified: Secondary | ICD-10-CM | POA: Diagnosis not present

## 2017-07-08 DIAGNOSIS — I10 Essential (primary) hypertension: Secondary | ICD-10-CM | POA: Diagnosis not present

## 2017-07-08 DIAGNOSIS — E559 Vitamin D deficiency, unspecified: Secondary | ICD-10-CM | POA: Diagnosis not present

## 2017-07-08 DIAGNOSIS — M1A30X0 Chronic gout due to renal impairment, unspecified site, without tophus (tophi): Secondary | ICD-10-CM | POA: Diagnosis not present

## 2017-07-08 DIAGNOSIS — E7800 Pure hypercholesterolemia, unspecified: Secondary | ICD-10-CM | POA: Diagnosis not present

## 2017-07-08 DIAGNOSIS — Z79899 Other long term (current) drug therapy: Secondary | ICD-10-CM | POA: Diagnosis not present

## 2017-07-10 DIAGNOSIS — I10 Essential (primary) hypertension: Secondary | ICD-10-CM | POA: Diagnosis not present

## 2017-07-10 DIAGNOSIS — R809 Proteinuria, unspecified: Secondary | ICD-10-CM | POA: Diagnosis not present

## 2017-07-10 DIAGNOSIS — N184 Chronic kidney disease, stage 4 (severe): Secondary | ICD-10-CM | POA: Diagnosis not present

## 2017-07-10 DIAGNOSIS — R600 Localized edema: Secondary | ICD-10-CM | POA: Diagnosis not present

## 2017-08-15 DIAGNOSIS — I639 Cerebral infarction, unspecified: Secondary | ICD-10-CM | POA: Diagnosis not present

## 2017-11-21 DIAGNOSIS — H401123 Primary open-angle glaucoma, left eye, severe stage: Secondary | ICD-10-CM | POA: Diagnosis not present

## 2018-01-15 DIAGNOSIS — I10 Essential (primary) hypertension: Secondary | ICD-10-CM | POA: Diagnosis not present

## 2018-01-15 DIAGNOSIS — N184 Chronic kidney disease, stage 4 (severe): Secondary | ICD-10-CM | POA: Diagnosis not present

## 2018-01-15 DIAGNOSIS — K219 Gastro-esophageal reflux disease without esophagitis: Secondary | ICD-10-CM | POA: Diagnosis not present

## 2018-01-15 DIAGNOSIS — J309 Allergic rhinitis, unspecified: Secondary | ICD-10-CM | POA: Diagnosis not present

## 2018-01-15 DIAGNOSIS — G809 Cerebral palsy, unspecified: Secondary | ICD-10-CM | POA: Diagnosis not present

## 2018-01-15 DIAGNOSIS — E559 Vitamin D deficiency, unspecified: Secondary | ICD-10-CM | POA: Diagnosis not present

## 2018-01-15 DIAGNOSIS — Z0000 Encounter for general adult medical examination without abnormal findings: Secondary | ICD-10-CM | POA: Diagnosis not present

## 2018-01-15 DIAGNOSIS — N183 Chronic kidney disease, stage 3 (moderate): Secondary | ICD-10-CM | POA: Diagnosis not present

## 2018-01-15 DIAGNOSIS — M1A30X0 Chronic gout due to renal impairment, unspecified site, without tophus (tophi): Secondary | ICD-10-CM | POA: Diagnosis not present

## 2018-01-15 DIAGNOSIS — R809 Proteinuria, unspecified: Secondary | ICD-10-CM | POA: Diagnosis not present

## 2018-01-15 DIAGNOSIS — Z79899 Other long term (current) drug therapy: Secondary | ICD-10-CM | POA: Diagnosis not present

## 2018-01-15 DIAGNOSIS — E7800 Pure hypercholesterolemia, unspecified: Secondary | ICD-10-CM | POA: Diagnosis not present

## 2018-02-04 DIAGNOSIS — N183 Chronic kidney disease, stage 3 (moderate): Secondary | ICD-10-CM | POA: Diagnosis not present

## 2018-02-04 DIAGNOSIS — R809 Proteinuria, unspecified: Secondary | ICD-10-CM | POA: Diagnosis not present

## 2018-02-04 DIAGNOSIS — I10 Essential (primary) hypertension: Secondary | ICD-10-CM | POA: Diagnosis not present

## 2018-05-15 DIAGNOSIS — H401123 Primary open-angle glaucoma, left eye, severe stage: Secondary | ICD-10-CM | POA: Diagnosis not present

## 2018-07-06 DIAGNOSIS — I10 Essential (primary) hypertension: Secondary | ICD-10-CM | POA: Diagnosis not present

## 2018-07-06 DIAGNOSIS — R809 Proteinuria, unspecified: Secondary | ICD-10-CM | POA: Diagnosis not present

## 2018-07-06 DIAGNOSIS — N183 Chronic kidney disease, stage 3 (moderate): Secondary | ICD-10-CM | POA: Diagnosis not present

## 2018-07-09 DIAGNOSIS — R809 Proteinuria, unspecified: Secondary | ICD-10-CM | POA: Diagnosis not present

## 2018-07-09 DIAGNOSIS — I129 Hypertensive chronic kidney disease with stage 1 through stage 4 chronic kidney disease, or unspecified chronic kidney disease: Secondary | ICD-10-CM | POA: Diagnosis not present

## 2018-07-09 DIAGNOSIS — N183 Chronic kidney disease, stage 3 (moderate): Secondary | ICD-10-CM | POA: Diagnosis not present

## 2018-07-09 DIAGNOSIS — N2581 Secondary hyperparathyroidism of renal origin: Secondary | ICD-10-CM | POA: Diagnosis not present

## 2018-07-20 DIAGNOSIS — E559 Vitamin D deficiency, unspecified: Secondary | ICD-10-CM | POA: Diagnosis not present

## 2018-07-20 DIAGNOSIS — G809 Cerebral palsy, unspecified: Secondary | ICD-10-CM | POA: Diagnosis not present

## 2018-07-20 DIAGNOSIS — M1A30X0 Chronic gout due to renal impairment, unspecified site, without tophus (tophi): Secondary | ICD-10-CM | POA: Diagnosis not present

## 2018-07-20 DIAGNOSIS — E7800 Pure hypercholesterolemia, unspecified: Secondary | ICD-10-CM | POA: Diagnosis not present

## 2018-07-20 DIAGNOSIS — J309 Allergic rhinitis, unspecified: Secondary | ICD-10-CM | POA: Diagnosis not present

## 2018-07-20 DIAGNOSIS — I10 Essential (primary) hypertension: Secondary | ICD-10-CM | POA: Diagnosis not present

## 2018-07-20 DIAGNOSIS — N184 Chronic kidney disease, stage 4 (severe): Secondary | ICD-10-CM | POA: Diagnosis not present

## 2018-07-20 DIAGNOSIS — Z79899 Other long term (current) drug therapy: Secondary | ICD-10-CM | POA: Diagnosis not present

## 2018-07-20 DIAGNOSIS — K219 Gastro-esophageal reflux disease without esophagitis: Secondary | ICD-10-CM | POA: Diagnosis not present

## 2018-11-03 DIAGNOSIS — H401123 Primary open-angle glaucoma, left eye, severe stage: Secondary | ICD-10-CM | POA: Diagnosis not present

## 2018-12-24 DIAGNOSIS — M79671 Pain in right foot: Secondary | ICD-10-CM | POA: Diagnosis not present

## 2018-12-24 DIAGNOSIS — M7661 Achilles tendinitis, right leg: Secondary | ICD-10-CM | POA: Diagnosis not present

## 2018-12-24 DIAGNOSIS — M722 Plantar fascial fibromatosis: Secondary | ICD-10-CM | POA: Diagnosis not present

## 2019-01-01 DIAGNOSIS — N183 Chronic kidney disease, stage 3 (moderate): Secondary | ICD-10-CM | POA: Diagnosis not present

## 2019-01-01 DIAGNOSIS — I10 Essential (primary) hypertension: Secondary | ICD-10-CM | POA: Diagnosis not present

## 2019-01-01 DIAGNOSIS — R809 Proteinuria, unspecified: Secondary | ICD-10-CM | POA: Diagnosis not present

## 2019-01-08 DIAGNOSIS — R809 Proteinuria, unspecified: Secondary | ICD-10-CM | POA: Diagnosis not present

## 2019-01-08 DIAGNOSIS — I129 Hypertensive chronic kidney disease with stage 1 through stage 4 chronic kidney disease, or unspecified chronic kidney disease: Secondary | ICD-10-CM | POA: Diagnosis not present

## 2019-01-08 DIAGNOSIS — N184 Chronic kidney disease, stage 4 (severe): Secondary | ICD-10-CM | POA: Diagnosis not present

## 2019-01-18 DIAGNOSIS — M7661 Achilles tendinitis, right leg: Secondary | ICD-10-CM | POA: Diagnosis not present

## 2019-01-18 DIAGNOSIS — E559 Vitamin D deficiency, unspecified: Secondary | ICD-10-CM | POA: Diagnosis not present

## 2019-01-18 DIAGNOSIS — J309 Allergic rhinitis, unspecified: Secondary | ICD-10-CM | POA: Diagnosis not present

## 2019-01-18 DIAGNOSIS — N184 Chronic kidney disease, stage 4 (severe): Secondary | ICD-10-CM | POA: Diagnosis not present

## 2019-01-18 DIAGNOSIS — M722 Plantar fascial fibromatosis: Secondary | ICD-10-CM | POA: Diagnosis not present

## 2019-01-18 DIAGNOSIS — Z125 Encounter for screening for malignant neoplasm of prostate: Secondary | ICD-10-CM | POA: Diagnosis not present

## 2019-01-18 DIAGNOSIS — E7800 Pure hypercholesterolemia, unspecified: Secondary | ICD-10-CM | POA: Diagnosis not present

## 2019-01-18 DIAGNOSIS — K219 Gastro-esophageal reflux disease without esophagitis: Secondary | ICD-10-CM | POA: Diagnosis not present

## 2019-01-18 DIAGNOSIS — I10 Essential (primary) hypertension: Secondary | ICD-10-CM | POA: Diagnosis not present

## 2019-01-18 DIAGNOSIS — G809 Cerebral palsy, unspecified: Secondary | ICD-10-CM | POA: Diagnosis not present

## 2019-01-18 DIAGNOSIS — Z0000 Encounter for general adult medical examination without abnormal findings: Secondary | ICD-10-CM | POA: Diagnosis not present

## 2019-02-08 DIAGNOSIS — M722 Plantar fascial fibromatosis: Secondary | ICD-10-CM | POA: Diagnosis not present

## 2019-02-08 DIAGNOSIS — M7661 Achilles tendinitis, right leg: Secondary | ICD-10-CM | POA: Diagnosis not present

## 2019-05-07 DIAGNOSIS — H401123 Primary open-angle glaucoma, left eye, severe stage: Secondary | ICD-10-CM | POA: Diagnosis not present

## 2019-06-15 DIAGNOSIS — H401123 Primary open-angle glaucoma, left eye, severe stage: Secondary | ICD-10-CM | POA: Diagnosis not present

## 2019-07-12 DIAGNOSIS — I10 Essential (primary) hypertension: Secondary | ICD-10-CM | POA: Diagnosis not present

## 2019-07-12 DIAGNOSIS — N184 Chronic kidney disease, stage 4 (severe): Secondary | ICD-10-CM | POA: Diagnosis not present

## 2019-07-12 DIAGNOSIS — R809 Proteinuria, unspecified: Secondary | ICD-10-CM | POA: Diagnosis not present

## 2019-07-19 DIAGNOSIS — N184 Chronic kidney disease, stage 4 (severe): Secondary | ICD-10-CM | POA: Diagnosis not present

## 2019-07-19 DIAGNOSIS — R801 Persistent proteinuria, unspecified: Secondary | ICD-10-CM | POA: Diagnosis not present

## 2019-07-19 DIAGNOSIS — I10 Essential (primary) hypertension: Secondary | ICD-10-CM | POA: Diagnosis not present

## 2019-07-23 DIAGNOSIS — E559 Vitamin D deficiency, unspecified: Secondary | ICD-10-CM | POA: Diagnosis not present

## 2019-07-23 DIAGNOSIS — K219 Gastro-esophageal reflux disease without esophagitis: Secondary | ICD-10-CM | POA: Diagnosis not present

## 2019-07-23 DIAGNOSIS — N184 Chronic kidney disease, stage 4 (severe): Secondary | ICD-10-CM | POA: Diagnosis not present

## 2019-07-23 DIAGNOSIS — I10 Essential (primary) hypertension: Secondary | ICD-10-CM | POA: Diagnosis not present

## 2019-07-23 DIAGNOSIS — G809 Cerebral palsy, unspecified: Secondary | ICD-10-CM | POA: Diagnosis not present

## 2019-07-23 DIAGNOSIS — E7800 Pure hypercholesterolemia, unspecified: Secondary | ICD-10-CM | POA: Diagnosis not present

## 2019-07-23 DIAGNOSIS — M542 Cervicalgia: Secondary | ICD-10-CM | POA: Diagnosis not present

## 2019-07-23 DIAGNOSIS — Z79899 Other long term (current) drug therapy: Secondary | ICD-10-CM | POA: Diagnosis not present

## 2019-09-20 DIAGNOSIS — Z79899 Other long term (current) drug therapy: Secondary | ICD-10-CM | POA: Diagnosis not present

## 2019-09-20 DIAGNOSIS — E7800 Pure hypercholesterolemia, unspecified: Secondary | ICD-10-CM | POA: Diagnosis not present

## 2019-10-19 DIAGNOSIS — H401123 Primary open-angle glaucoma, left eye, severe stage: Secondary | ICD-10-CM | POA: Diagnosis not present

## 2019-11-30 DIAGNOSIS — K219 Gastro-esophageal reflux disease without esophagitis: Secondary | ICD-10-CM | POA: Insufficient documentation

## 2019-11-30 DIAGNOSIS — I10 Essential (primary) hypertension: Secondary | ICD-10-CM | POA: Diagnosis not present

## 2019-11-30 DIAGNOSIS — J300 Vasomotor rhinitis: Secondary | ICD-10-CM | POA: Insufficient documentation

## 2019-11-30 DIAGNOSIS — R801 Persistent proteinuria, unspecified: Secondary | ICD-10-CM | POA: Diagnosis not present

## 2019-11-30 DIAGNOSIS — N183 Chronic kidney disease, stage 3 unspecified: Secondary | ICD-10-CM | POA: Insufficient documentation

## 2019-11-30 DIAGNOSIS — M5136 Other intervertebral disc degeneration, lumbar region: Secondary | ICD-10-CM | POA: Insufficient documentation

## 2019-11-30 DIAGNOSIS — E559 Vitamin D deficiency, unspecified: Secondary | ICD-10-CM | POA: Insufficient documentation

## 2019-11-30 DIAGNOSIS — E7800 Pure hypercholesterolemia, unspecified: Secondary | ICD-10-CM | POA: Insufficient documentation

## 2019-11-30 DIAGNOSIS — N184 Chronic kidney disease, stage 4 (severe): Secondary | ICD-10-CM | POA: Diagnosis not present

## 2019-11-30 DIAGNOSIS — J309 Allergic rhinitis, unspecified: Secondary | ICD-10-CM | POA: Insufficient documentation

## 2019-11-30 DIAGNOSIS — G9349 Other encephalopathy: Secondary | ICD-10-CM | POA: Insufficient documentation

## 2019-12-06 DIAGNOSIS — N184 Chronic kidney disease, stage 4 (severe): Secondary | ICD-10-CM | POA: Diagnosis not present

## 2019-12-06 DIAGNOSIS — R801 Persistent proteinuria, unspecified: Secondary | ICD-10-CM | POA: Diagnosis not present

## 2019-12-06 DIAGNOSIS — N2581 Secondary hyperparathyroidism of renal origin: Secondary | ICD-10-CM | POA: Diagnosis not present

## 2019-12-06 DIAGNOSIS — I10 Essential (primary) hypertension: Secondary | ICD-10-CM | POA: Diagnosis not present

## 2020-01-20 DIAGNOSIS — G809 Cerebral palsy, unspecified: Secondary | ICD-10-CM | POA: Diagnosis not present

## 2020-01-20 DIAGNOSIS — Z0000 Encounter for general adult medical examination without abnormal findings: Secondary | ICD-10-CM | POA: Diagnosis not present

## 2020-01-20 DIAGNOSIS — Z800 Family history of malignant neoplasm of digestive organs: Secondary | ICD-10-CM | POA: Insufficient documentation

## 2020-01-20 DIAGNOSIS — M1A30X0 Chronic gout due to renal impairment, unspecified site, without tophus (tophi): Secondary | ICD-10-CM | POA: Diagnosis not present

## 2020-01-20 DIAGNOSIS — R7309 Other abnormal glucose: Secondary | ICD-10-CM | POA: Diagnosis not present

## 2020-01-20 DIAGNOSIS — Z79899 Other long term (current) drug therapy: Secondary | ICD-10-CM | POA: Diagnosis not present

## 2020-01-20 DIAGNOSIS — D631 Anemia in chronic kidney disease: Secondary | ICD-10-CM | POA: Insufficient documentation

## 2020-01-20 DIAGNOSIS — R809 Proteinuria, unspecified: Secondary | ICD-10-CM | POA: Diagnosis not present

## 2020-01-20 DIAGNOSIS — I129 Hypertensive chronic kidney disease with stage 1 through stage 4 chronic kidney disease, or unspecified chronic kidney disease: Secondary | ICD-10-CM | POA: Diagnosis not present

## 2020-01-20 DIAGNOSIS — J309 Allergic rhinitis, unspecified: Secondary | ICD-10-CM | POA: Diagnosis not present

## 2020-01-20 DIAGNOSIS — K219 Gastro-esophageal reflux disease without esophagitis: Secondary | ICD-10-CM | POA: Diagnosis not present

## 2020-01-20 DIAGNOSIS — E7800 Pure hypercholesterolemia, unspecified: Secondary | ICD-10-CM | POA: Diagnosis not present

## 2020-01-20 DIAGNOSIS — N184 Chronic kidney disease, stage 4 (severe): Secondary | ICD-10-CM | POA: Diagnosis not present

## 2020-01-20 DIAGNOSIS — N2581 Secondary hyperparathyroidism of renal origin: Secondary | ICD-10-CM | POA: Diagnosis not present

## 2020-01-20 DIAGNOSIS — E669 Obesity, unspecified: Secondary | ICD-10-CM | POA: Diagnosis not present

## 2020-01-20 DIAGNOSIS — R110 Nausea: Secondary | ICD-10-CM | POA: Diagnosis not present

## 2020-02-28 DIAGNOSIS — N184 Chronic kidney disease, stage 4 (severe): Secondary | ICD-10-CM | POA: Diagnosis not present

## 2020-03-07 DIAGNOSIS — N2581 Secondary hyperparathyroidism of renal origin: Secondary | ICD-10-CM | POA: Diagnosis not present

## 2020-03-07 DIAGNOSIS — N184 Chronic kidney disease, stage 4 (severe): Secondary | ICD-10-CM | POA: Diagnosis not present

## 2020-03-07 DIAGNOSIS — I10 Essential (primary) hypertension: Secondary | ICD-10-CM | POA: Diagnosis not present

## 2020-03-07 DIAGNOSIS — R801 Persistent proteinuria, unspecified: Secondary | ICD-10-CM | POA: Diagnosis not present

## 2020-03-27 DIAGNOSIS — R1032 Left lower quadrant pain: Secondary | ICD-10-CM | POA: Diagnosis not present

## 2020-03-27 DIAGNOSIS — R1031 Right lower quadrant pain: Secondary | ICD-10-CM | POA: Diagnosis not present

## 2020-03-27 DIAGNOSIS — Z800 Family history of malignant neoplasm of digestive organs: Secondary | ICD-10-CM | POA: Diagnosis not present

## 2020-03-27 DIAGNOSIS — K5900 Constipation, unspecified: Secondary | ICD-10-CM | POA: Diagnosis not present

## 2020-03-27 DIAGNOSIS — K920 Hematemesis: Secondary | ICD-10-CM | POA: Diagnosis not present

## 2020-03-27 DIAGNOSIS — K219 Gastro-esophageal reflux disease without esophagitis: Secondary | ICD-10-CM | POA: Diagnosis not present

## 2020-04-28 DIAGNOSIS — H401123 Primary open-angle glaucoma, left eye, severe stage: Secondary | ICD-10-CM | POA: Diagnosis not present

## 2020-05-10 ENCOUNTER — Other Ambulatory Visit
Admission: RE | Admit: 2020-05-10 | Discharge: 2020-05-10 | Disposition: A | Discharge status: Home / Self Care | Payer: Medicare HMO | Source: Ambulatory Visit | Attending: Gastroenterology | Admitting: Gastroenterology

## 2020-05-10 DIAGNOSIS — Z20822 Contact with and (suspected) exposure to covid-19: Secondary | ICD-10-CM | POA: Insufficient documentation

## 2020-05-10 DIAGNOSIS — Z01812 Encounter for preprocedural laboratory examination: Secondary | ICD-10-CM | POA: Diagnosis not present

## 2020-05-10 LAB — SARS CORONAVIRUS 2 (TAT 6-24 HRS): SARS Coronavirus 2: NEGATIVE

## 2020-05-12 ENCOUNTER — Other Ambulatory Visit: Payer: Self-pay

## 2020-05-12 ENCOUNTER — Ambulatory Visit: Payer: Medicare HMO | Admitting: Anesthesiology

## 2020-05-12 ENCOUNTER — Ambulatory Visit
Admission: RE | Admit: 2020-05-12 | Discharge: 2020-05-12 | Disposition: A | Discharge status: Home / Self Care | Payer: Medicare HMO | Attending: Gastroenterology | Admitting: Gastroenterology

## 2020-05-12 ENCOUNTER — Encounter: Payer: Self-pay | Admitting: *Deleted

## 2020-05-12 ENCOUNTER — Encounter
Admission: RE | Disposition: A | Discharge status: Home / Self Care | Payer: Self-pay | Source: Home / Self Care | Attending: Gastroenterology

## 2020-05-12 DIAGNOSIS — Z885 Allergy status to narcotic agent status: Secondary | ICD-10-CM | POA: Insufficient documentation

## 2020-05-12 DIAGNOSIS — N189 Chronic kidney disease, unspecified: Secondary | ICD-10-CM | POA: Insufficient documentation

## 2020-05-12 DIAGNOSIS — K317 Polyp of stomach and duodenum: Secondary | ICD-10-CM | POA: Insufficient documentation

## 2020-05-12 DIAGNOSIS — D123 Benign neoplasm of transverse colon: Secondary | ICD-10-CM | POA: Diagnosis not present

## 2020-05-12 DIAGNOSIS — K2930 Chronic superficial gastritis without bleeding: Secondary | ICD-10-CM | POA: Diagnosis not present

## 2020-05-12 DIAGNOSIS — K2950 Unspecified chronic gastritis without bleeding: Secondary | ICD-10-CM | POA: Diagnosis not present

## 2020-05-12 DIAGNOSIS — K2100 Gastro-esophageal reflux disease with esophagitis, without bleeding: Secondary | ICD-10-CM | POA: Diagnosis not present

## 2020-05-12 DIAGNOSIS — Z86010 Personal history of colonic polyps: Secondary | ICD-10-CM | POA: Insufficient documentation

## 2020-05-12 DIAGNOSIS — Z881 Allergy status to other antibiotic agents status: Secondary | ICD-10-CM | POA: Diagnosis not present

## 2020-05-12 DIAGNOSIS — K219 Gastro-esophageal reflux disease without esophagitis: Secondary | ICD-10-CM | POA: Diagnosis not present

## 2020-05-12 DIAGNOSIS — G809 Cerebral palsy, unspecified: Secondary | ICD-10-CM | POA: Diagnosis not present

## 2020-05-12 DIAGNOSIS — K5900 Constipation, unspecified: Secondary | ICD-10-CM | POA: Diagnosis not present

## 2020-05-12 DIAGNOSIS — Z800 Family history of malignant neoplasm of digestive organs: Secondary | ICD-10-CM | POA: Insufficient documentation

## 2020-05-12 DIAGNOSIS — K635 Polyp of colon: Secondary | ICD-10-CM | POA: Diagnosis not present

## 2020-05-12 DIAGNOSIS — Z79899 Other long term (current) drug therapy: Secondary | ICD-10-CM | POA: Diagnosis not present

## 2020-05-12 DIAGNOSIS — I129 Hypertensive chronic kidney disease with stage 1 through stage 4 chronic kidney disease, or unspecified chronic kidney disease: Secondary | ICD-10-CM | POA: Diagnosis not present

## 2020-05-12 DIAGNOSIS — K920 Hematemesis: Secondary | ICD-10-CM | POA: Diagnosis not present

## 2020-05-12 DIAGNOSIS — E7800 Pure hypercholesterolemia, unspecified: Secondary | ICD-10-CM | POA: Diagnosis not present

## 2020-05-12 DIAGNOSIS — Z1211 Encounter for screening for malignant neoplasm of colon: Secondary | ICD-10-CM | POA: Diagnosis not present

## 2020-05-12 HISTORY — PX: COLONOSCOPY WITH PROPOFOL: SHX5780

## 2020-05-12 HISTORY — PX: ESOPHAGOGASTRODUODENOSCOPY (EGD) WITH PROPOFOL: SHX5813

## 2020-05-12 SURGERY — ESOPHAGOGASTRODUODENOSCOPY (EGD) WITH PROPOFOL
Anesthesia: General

## 2020-05-12 MED ORDER — PHENYLEPHRINE HCL (PRESSORS) 10 MG/ML IV SOLN
INTRAVENOUS | Status: DC | PRN
  Administered 2020-05-12 (×2): 100 ug via INTRAVENOUS

## 2020-05-12 MED ORDER — PROPOFOL 10 MG/ML IV BOLUS
INTRAVENOUS | Status: DC | PRN
  Administered 2020-05-12: 60 mg via INTRAVENOUS

## 2020-05-12 MED ORDER — PROPOFOL 500 MG/50ML IV EMUL
INTRAVENOUS | Status: AC
  Filled 2020-05-12: qty 50

## 2020-05-12 MED ORDER — LIDOCAINE HCL (PF) 2 % IJ SOLN
INTRAMUSCULAR | Status: DC | PRN
  Administered 2020-05-12: 100 mg via INTRADERMAL

## 2020-05-12 MED ORDER — PROPOFOL 500 MG/50ML IV EMUL
INTRAVENOUS | Status: DC | PRN
  Administered 2020-05-12: 175 ug/kg/min via INTRAVENOUS

## 2020-05-12 MED ORDER — SODIUM CHLORIDE 0.9 % IV SOLN: INTRAVENOUS | Status: DC

## 2020-05-12 NOTE — Anesthesia Preprocedure Evaluation (Signed)
Anesthesia Evaluation  Patient identified by MRN, date of birth, ID band Patient awake    Reviewed: Allergy & Precautions, H&P , NPO status , Patient's Chart, lab work & pertinent test results  History of Anesthesia Complications Negative for: history of anesthetic complications  Airway Mallampati: III  TM Distance: >3 FB Neck ROM: limited    Dental  (+) Poor Dentition, Missing, Chipped   Pulmonary neg shortness of breath,    Pulmonary exam normal        Cardiovascular Exercise Tolerance: Good hypertension, Normal cardiovascular exam     Neuro/Psych CVA, Residual Symptoms negative psych ROS   GI/Hepatic Neg liver ROS, GERD  Medicated and Controlled,  Endo/Other  negative endocrine ROS  Renal/GU Renal disease  negative genitourinary   Musculoskeletal   Abdominal   Peds  Hematology negative hematology ROS (+)   Anesthesia Other Findings Past Medical History: No date: Arthritis No date: Cerebral palsy ([facility]) No date: Chronic kidney disease No date: Deficiency of vitamin B12 No date: Encephalitis No date: GERD (gastroesophageal reflux disease) No date: Hemorrhoid No date: Hypercholesterolemia No date: Hypertension  Past Surgical History: 05/25/2015: COLONOSCOPY WITH PROPOFOL; N/A     Comment:  Procedure: COLONOSCOPY WITH PROPOFOL;  Surgeon: Manya Silvas, MD;  Location: [facility] ENDOSCOPY;  Service:               Endoscopy;  Laterality: N/A; No date: FRACTURE SURGERY  BMI    Body Mass Index: 31.09 kg/m      Reproductive/Obstetrics negative OB ROS                             Anesthesia Physical Anesthesia Plan  ASA: III  Anesthesia Plan: General   Post-op Pain Management:    Induction: Intravenous  PONV Risk Score and Plan: Propofol infusion and TIVA  Airway Management Planned: Natural Airway and Nasal Cannula  Additional Equipment:   Intra-op Plan:    Post-operative Plan:   Informed Consent: I have reviewed the patients History and Physical, chart, labs and discussed the procedure including the risks, benefits and alternatives for the proposed anesthesia with the patient or authorized representative who has indicated his/her understanding and acceptance.     Dental Advisory Given  Plan Discussed with: Anesthesiologist, CRNA and Surgeon  Anesthesia Plan Comments: (Patient consented for risks of anesthesia including but not limited to:  - adverse reactions to medications - risk of airway placement if required - damage to eyes, teeth, lips or other oral mucosa - nerve damage due to positioning  - sore throat or hoarseness - Damage to heart, brain, nerves, lungs, other parts of body or loss of life  Patient voiced understanding.)        Anesthesia Quick Evaluation

## 2020-05-12 NOTE — H&P (Signed)
Outpatient short stay form Pre-procedure 05/12/2020 7:59 AM Raylene Miyamoto MD, MPH  Primary Physician: Dr. Raechel Ache  Reason for visit:  EGD/Colonoscopy  History of present illness:   64 y/o gentleman with family history of colon cancer and heart burn with some coffee grounds here for EGD/Colonoscopy. No blood thinners. No abdominal surgeries. No NSAIDS.    Current Facility-Administered Medications:  .  0.9 %  sodium chloride infusion, , Intravenous, Continuous, Sadonna Kotara, Hilton Cork, MD, Last Rate: 20 mL/hr at 05/12/20 0728, New Bag at 05/12/20 0728  Medications Prior to Admission  Medication Sig Dispense Refill Last Dose  . amLODipine (NORVASC) 5 MG tablet Take 5 mg by mouth daily.   05/12/2020 at Unknown time  . atorvastatin (LIPITOR) 10 MG tablet Take 10 mg by mouth daily.   05/11/2020 at Unknown time  . Cholecalciferol 5000 units TABS Take 5,000 Units by mouth once a week.   Past Week at Unknown time  . esomeprazole (NEXIUM) 40 MG capsule Take 40 mg by mouth daily at 12 noon.   05/11/2020 at Unknown time  . losartan (COZAAR) 50 MG tablet Take 50 mg by mouth daily.   05/12/2020 at Unknown time  . metoCLOPramide (REGLAN) 10 MG tablet Take 10 mg by mouth 2 (two) times daily with breakfast and lunch.   05/11/2020 at Unknown time  . Netarsudil-Latanoprost (ROCKLATAN) 0.02-0.005 % SOLN Apply 1 drop to eye every evening. Each Eye   Past Week at Unknown time  . timolol (TIMOPTIC-XR) 0.5 % ophthalmic gel-forming Place 1 drop into both eyes 2 (two) times daily.   Past Week at Unknown time  . acetaminophen (TYLENOL) 500 MG tablet Take 500 mg by mouth every 8 (eight) hours as needed.     . fluticasone (FLONASE) 50 MCG/ACT nasal spray Place 2 sprays into both nostrils daily as needed for allergies or rhinitis.     . Travoprost, BAK Free, (TRAVATAN) 0.004 % SOLN ophthalmic solution Place 1 drop into both eyes at bedtime.        Allergies  Allergen Reactions  . Ceftin [Cefuroxime] Rash  .  Tramadol Nausea Only and Other (See Comments)    Dizziness     Past Medical History:  Diagnosis Date  . Arthritis   . Cerebral palsy ([place])   . Chronic kidney disease   . Deficiency of vitamin B12   . Encephalitis   . GERD (gastroesophageal reflux disease)   . Hemorrhoid   . Hypercholesterolemia   . Hypertension     Review of systems:  Otherwise negative.    Physical Exam  Gen: Alert, oriented. Appears stated age.  HEENT: PERRLA. Lungs: No respiratory distress CV: RRR Abd: soft, benign, no masses Ext: No edema.     Planned procedures: Proceed with EGD/colonoscopy. The patient understands the nature of the planned procedure, indications, risks, alternatives and potential complications including but not limited to bleeding, infection, perforation, damage to internal organs and possible oversedation/side effects from anesthesia. The patient agrees and gives consent to proceed.  Please refer to procedure notes for findings, recommendations and patient disposition/instructions.     Raylene Miyamoto MD, MPH Gastroenterology 05/12/2020  7:59 AM

## 2020-05-12 NOTE — Op Note (Addendum)
[facility] Gastroenterology Patient Name: James Black Procedure Date: 05/12/2020 7:16 AM MRN: [id number] Account #: [id number] Date of Birth: Sep 29, 1955 Admit Type: Outpatient Age: 64 Room: [facility] ENDO ROOM 3 Gender: Male Note Status: Supervisor Override Procedure:             Colonoscopy Indications:           Screening in patient at increased risk: Family history                         of 1st-degree relative with colorectal cancer Providers:             Andrey Farmer MD, MD Referring MD:          Christena Flake. Raechel Ache, MD (Referring MD) Medicines:             Monitored Anesthesia Care Complications:         No immediate complications. Estimated blood loss:                         Minimal. Procedure:             Pre-Anesthesia Assessment:                        - Prior to the procedure, a History and Physical was                         performed, and patient medications and allergies were                         reviewed. The patient is competent. The risks and                         benefits of the procedure and the sedation options and                         risks were discussed with the patient. All questions                         were answered and informed consent was obtained.                         Patient identification and proposed procedure were                         verified by the physician, the nurse, the anesthetist                         and the technician in the endoscopy suite. Mental                         Status Examination: alert and oriented. Airway                         Examination: normal oropharyngeal airway and neck                         mobility. Respiratory Examination: clear to  auscultation. CV Examination: normal. Prophylactic                         Antibiotics: The patient does not require prophylactic                         antibiotics. Prior Anticoagulants: The patient has                          taken no previous anticoagulant or antiplatelet                         agents. ASA Grade Assessment: III - A patient with                         severe systemic disease. After reviewing the risks and                         benefits, the patient was deemed in satisfactory                         condition to undergo the procedure. The anesthesia                         plan was to use monitored anesthesia care (MAC).                         Immediately prior to administration of medications,                         the patient was re-assessed for adequacy to receive                         sedatives. The heart rate, respiratory rate, oxygen                         saturations, blood pressure, adequacy of pulmonary                         ventilation, and response to care were monitored                         throughout the procedure. The physical status of the                         patient was re-assessed after the procedure.                        After obtaining informed consent, the colonoscope was                         passed under direct vision. Throughout the procedure,                         the patient's blood pressure, pulse, and oxygen                         saturations were monitored continuously. The  Colonoscope was introduced through the anus and                         advanced to the the cecum, identified by appendiceal                         orifice and ileocecal valve. The colonoscopy was                         performed without difficulty. The patient tolerated                         the procedure well. The quality of the bowel                         preparation was adequate to identify polyps. Findings:      The perianal and digital rectal examinations were normal.      A 3 mm polyp was found in the transverse colon. The polyp was sessile.       The polyp was removed with a cold snare. Resection and retrieval were       complete.  Estimated blood loss was minimal.      The exam was otherwise without abnormality on direct and retroflexion       views. Impression:            - One 3 mm polyp in the transverse colon, removed with                         a cold snare. Resected and retrieved.                        - The examination was otherwise normal on direct and                         retroflexion views. Recommendation:        - Discharge patient to home.                        - Resume previous diet.                        - Continue present medications.                        - Await pathology results.                        - Repeat colonoscopy in 5 years for surveillance.                        - Return to referring physician as previously                         scheduled. Procedure Code(s):     --- Professional ---                        [phone], Colonoscopy, flexible; with removal of  tumor(s), polyp(s), or other lesion(s) by snare                         technique Diagnosis Code(s):     --- Professional ---                        K63.5, Polyp of colon                        R10.30, Lower abdominal pain, unspecified                        K59.00, Constipation, unspecified CPT copyright 2019 [employer] Medical [employer]. All rights reserved. The codes documented in this report are preliminary and upon coder review may  be revised to meet current compliance requirements. Andrey Farmer MD, MD 05/12/2020 8:44:03 AM Number of Addenda: 0 Note Initiated On: 05/12/2020 7:16 AM Scope Withdrawal Time: 0 hours 8 minutes 24 seconds  Total Procedure Duration: 0 hours 14 minutes 16 seconds  Estimated Blood Loss:  Estimated blood loss was minimal.      [facility]

## 2020-05-12 NOTE — Interval H&P Note (Signed)
History and Physical Interval Note:  05/12/2020 8:01 AM  James Black  has presented today for surgery, with the diagnosis of family hx colon cancer, GERD, hemetemisis.  The various methods of treatment have been discussed with the patient and family. After consideration of risks, benefits and other options for treatment, the patient has consented to  Procedure(s): ESOPHAGOGASTRODUODENOSCOPY (EGD) WITH PROPOFOL (N/A) COLONOSCOPY WITH PROPOFOL (N/A) as a surgical intervention.  The patient's history has been reviewed, patient examined, no change in status, stable for surgery.  I have reviewed the patient's chart and labs.  Questions were answered to the patient's satisfaction.     Lesly Rubenstein  Ok to proceed with EGD/Colonoscopy

## 2020-05-12 NOTE — Anesthesia Postprocedure Evaluation (Signed)
Anesthesia Post Note  Patient: AZAR SOUTH  Procedure(s) Performed: ESOPHAGOGASTRODUODENOSCOPY (EGD) WITH PROPOFOL (N/A ) COLONOSCOPY WITH PROPOFOL (N/A )  Patient location during evaluation: Endoscopy Anesthesia Type: General Level of consciousness: awake and alert Pain management: pain level controlled Vital Signs Assessment: post-procedure vital signs reviewed and stable Respiratory status: spontaneous breathing, nonlabored ventilation, respiratory function stable and patient connected to nasal cannula oxygen Cardiovascular status: blood pressure returned to baseline and stable Postop Assessment: no apparent nausea or vomiting Anesthetic complications: no   No complications documented.   Last Vitals:  Vitals:   05/12/20 0852 05/12/20 0856  BP: 105/82 117/83  Pulse: 85 75  Resp: 15 13  Temp:    SpO2: 100% 100%    Last Pain:  Vitals:   05/12/20 0713  TempSrc: Temporal  PainSc: 0-No pain                 Precious Haws Brittnee Gaetano

## 2020-05-12 NOTE — Anesthesia Procedure Notes (Signed)
Date/Time: 05/12/2020 8:09 AM Performed by: Nelda Marseille, CRNA Pre-anesthesia Checklist: Patient identified, Emergency Drugs available, Suction available, Patient being monitored and Timeout performed Oxygen Delivery Method: Nasal cannula

## 2020-05-12 NOTE — Transfer of Care (Signed)
Immediate Anesthesia Transfer of Care Note  Patient: James Black  Procedure(s) Performed: ESOPHAGOGASTRODUODENOSCOPY (EGD) WITH PROPOFOL (N/A ) COLONOSCOPY WITH PROPOFOL (N/A )  Patient Location: PACU  Anesthesia Type:General  Level of Consciousness: sedated  Airway & Oxygen Therapy: Patient Spontanous Breathing and Patient connected to nasal cannula oxygen  Post-op Assessment: Report given to RN and Post -op Vital signs reviewed and stable  Post vital signs: Reviewed and stable  Last Vitals:  Vitals Value Taken Time  BP    Temp    Pulse 80 05/12/20 0835  Resp 18 05/12/20 0835  SpO2 97 % 05/12/20 0835  Vitals shown include unvalidated device data.  Last Pain:  Vitals:   05/12/20 0713  TempSrc: Temporal  PainSc: 0-No pain         Complications: No complications documented.

## 2020-05-12 NOTE — Op Note (Signed)
[facility] Gastroenterology Patient Name: James Black Procedure Date: 05/12/2020 7:17 AM MRN: [id number] Account #: [id number] Date of Birth: September 12, 1955 Admit Type: Outpatient Age: 64 Room: [facility] ENDO ROOM 3 Gender: Male Note Status: Finalized Procedure:             Upper GI endoscopy Indications:           Gastro-esophageal reflux disease, Coffee-ground emesis Providers:             Andrey Farmer MD, MD Referring MD:          Christena Flake. Raechel Ache, MD (Referring MD) Medicines:             Monitored Anesthesia Care Complications:         No immediate complications. Estimated blood loss:                         Minimal. Procedure:             Pre-Anesthesia Assessment:                        - Prior to the procedure, a History and Physical was                         performed, and patient medications and allergies were                         reviewed. The patient is competent. The risks and                         benefits of the procedure and the sedation options and                         risks were discussed with the patient. All questions                         were answered and informed consent was obtained.                         Patient identification and proposed procedure were                         verified by the physician, the nurse, the anesthetist                         and the technician in the endoscopy suite. Mental                         Status Examination: alert and oriented. Airway                         Examination: normal oropharyngeal airway and neck                         mobility. Respiratory Examination: clear to                         auscultation. CV Examination: normal. Prophylactic  Antibiotics: The patient does not require prophylactic                         antibiotics. Prior Anticoagulants: The patient has                         taken no previous anticoagulant or antiplatelet                          agents. ASA Grade Assessment: III - A patient with                         severe systemic disease. After reviewing the risks and                         benefits, the patient was deemed in satisfactory                         condition to undergo the procedure. The anesthesia                         plan was to use monitored anesthesia care (MAC).                         Immediately prior to administration of medications,                         the patient was re-assessed for adequacy to receive                         sedatives. The heart rate, respiratory rate, oxygen                         saturations, blood pressure, adequacy of pulmonary                         ventilation, and response to care were monitored                         throughout the procedure. The physical status of the                         patient was re-assessed after the procedure.                        After obtaining informed consent, the endoscope was                         passed under direct vision. Throughout the procedure,                         the patient's blood pressure, pulse, and oxygen                         saturations were monitored continuously. The Endoscope                         was introduced through the mouth, and advanced to the  second part of duodenum. The upper GI endoscopy was                         accomplished without difficulty. The patient tolerated                         the procedure well. Findings:      The examined esophagus was normal.      A very small amount of hematin (altered blood/coffee-ground-like       material) was found in the gastric antrum.      A single 2 mm sessile polyp with no stigmata of recent bleeding was       found on the lesser curvature of the stomach. The polyp was sampled with       a cold biopsy forceps. Polyp resection was incomplete. Appeared as       fundic gland polyp The resected tissue was retrieved. Estimated  blood       loss was minimal.      Biopsies were taken with a cold forceps in the stomach for Helicobacter       pylori testing. Estimated blood loss was minimal.      The examined duodenum was normal. Impression:            - Normal esophagus.                        - Hematin (altered blood/coffee-ground-like material)                         in the gastric antrum.                        - A single gastric polyp. Incomplete resection.                         Resected tissue retrieved.                        - Normal examined duodenum.                        - Biopsies were taken with a cold forceps for                         Helicobacter pylori testing. Recommendation:        - Discharge patient to home.                        - Resume previous diet.                        - Continue present medications.                        - Await pathology results.                        - Return to referring physician as previously                         scheduled. Procedure Code(s):     --- Professional ---  55208, Esophagogastroduodenoscopy, flexible,                         transoral; with biopsy, single or multiple Diagnosis Code(s):     --- Professional ---                        K92.2, Gastrointestinal hemorrhage, unspecified                        K31.7, Polyp of stomach and duodenum                        K21.9, Gastro-esophageal reflux disease without                         esophagitis                        K92.0, Hematemesis CPT copyright 2019 American Medical Association. All rights reserved. The codes documented in this report are preliminary and upon coder review may  be revised to meet current compliance requirements. Andrey Farmer, MD Andrey Farmer MD, MD 05/12/2020 8:40:17 AM Number of Addenda: 0 Note Initiated On: 05/12/2020 7:17 AM Estimated Blood Loss:  Estimated blood loss was minimal.      [facility]

## 2020-05-15 ENCOUNTER — Encounter: Payer: Self-pay | Admitting: Gastroenterology

## 2020-05-15 LAB — SURGICAL PATHOLOGY

## 2020-06-19 DIAGNOSIS — N2581 Secondary hyperparathyroidism of renal origin: Secondary | ICD-10-CM | POA: Diagnosis not present

## 2020-06-19 DIAGNOSIS — E039 Hypothyroidism, unspecified: Secondary | ICD-10-CM | POA: Diagnosis not present

## 2020-06-19 DIAGNOSIS — I10 Essential (primary) hypertension: Secondary | ICD-10-CM | POA: Diagnosis not present

## 2020-06-19 DIAGNOSIS — R801 Persistent proteinuria, unspecified: Secondary | ICD-10-CM | POA: Diagnosis not present

## 2020-06-19 DIAGNOSIS — N184 Chronic kidney disease, stage 4 (severe): Secondary | ICD-10-CM | POA: Diagnosis not present

## 2020-06-26 ENCOUNTER — Other Ambulatory Visit: Payer: Self-pay | Admitting: Internal Medicine

## 2020-06-26 ENCOUNTER — Other Ambulatory Visit (HOSPITAL_COMMUNITY): Payer: Self-pay | Admitting: Internal Medicine

## 2020-06-26 DIAGNOSIS — N184 Chronic kidney disease, stage 4 (severe): Secondary | ICD-10-CM

## 2020-06-29 ENCOUNTER — Other Ambulatory Visit: Payer: Self-pay

## 2020-06-29 ENCOUNTER — Ambulatory Visit
Admission: RE | Admit: 2020-06-29 | Discharge: 2020-06-29 | Disposition: A | Discharge status: Home / Self Care | Payer: Medicare HMO | Source: Ambulatory Visit | Attending: Internal Medicine | Admitting: Internal Medicine

## 2020-06-29 DIAGNOSIS — N281 Cyst of kidney, acquired: Secondary | ICD-10-CM | POA: Diagnosis not present

## 2020-06-29 DIAGNOSIS — N184 Chronic kidney disease, stage 4 (severe): Secondary | ICD-10-CM

## 2020-06-29 DIAGNOSIS — N179 Acute kidney failure, unspecified: Secondary | ICD-10-CM | POA: Diagnosis not present

## 2020-07-06 DIAGNOSIS — N184 Chronic kidney disease, stage 4 (severe): Secondary | ICD-10-CM | POA: Diagnosis not present

## 2020-07-06 DIAGNOSIS — N179 Acute kidney failure, unspecified: Secondary | ICD-10-CM | POA: Diagnosis not present

## 2020-07-13 DIAGNOSIS — E875 Hyperkalemia: Secondary | ICD-10-CM | POA: Diagnosis not present

## 2020-07-13 DIAGNOSIS — R801 Persistent proteinuria, unspecified: Secondary | ICD-10-CM | POA: Diagnosis not present

## 2020-07-13 DIAGNOSIS — I10 Essential (primary) hypertension: Secondary | ICD-10-CM | POA: Diagnosis not present

## 2020-07-13 DIAGNOSIS — N2581 Secondary hyperparathyroidism of renal origin: Secondary | ICD-10-CM | POA: Diagnosis not present

## 2020-07-13 DIAGNOSIS — N185 Chronic kidney disease, stage 5: Secondary | ICD-10-CM | POA: Diagnosis not present

## 2020-07-24 ENCOUNTER — Other Ambulatory Visit: Payer: Self-pay

## 2020-07-24 ENCOUNTER — Encounter: Payer: Self-pay | Admitting: Surgery

## 2020-07-24 ENCOUNTER — Ambulatory Visit (INDEPENDENT_AMBULATORY_CARE_PROVIDER_SITE_OTHER): Payer: Medicare HMO | Admitting: Surgery

## 2020-07-24 VITALS — BP 138/88 | HR 73 | Temp 98.2°F | Ht 66.0 in | Wt 175.2 lb

## 2020-07-24 DIAGNOSIS — D631 Anemia in chronic kidney disease: Secondary | ICD-10-CM | POA: Diagnosis not present

## 2020-07-24 DIAGNOSIS — J309 Allergic rhinitis, unspecified: Secondary | ICD-10-CM | POA: Diagnosis not present

## 2020-07-24 DIAGNOSIS — E559 Vitamin D deficiency, unspecified: Secondary | ICD-10-CM | POA: Diagnosis not present

## 2020-07-24 DIAGNOSIS — N2581 Secondary hyperparathyroidism of renal origin: Secondary | ICD-10-CM | POA: Diagnosis not present

## 2020-07-24 DIAGNOSIS — N186 End stage renal disease: Secondary | ICD-10-CM | POA: Diagnosis not present

## 2020-07-24 DIAGNOSIS — N185 Chronic kidney disease, stage 5: Secondary | ICD-10-CM | POA: Diagnosis not present

## 2020-07-24 DIAGNOSIS — E7800 Pure hypercholesterolemia, unspecified: Secondary | ICD-10-CM | POA: Diagnosis not present

## 2020-07-24 DIAGNOSIS — K219 Gastro-esophageal reflux disease without esophagitis: Secondary | ICD-10-CM | POA: Diagnosis not present

## 2020-07-24 DIAGNOSIS — G809 Cerebral palsy, unspecified: Secondary | ICD-10-CM | POA: Diagnosis not present

## 2020-07-24 DIAGNOSIS — I120 Hypertensive chronic kidney disease with stage 5 chronic kidney disease or end stage renal disease: Secondary | ICD-10-CM | POA: Diagnosis not present

## 2020-07-24 NOTE — Patient Instructions (Signed)
Our surgery scheduler will call you within 24-48 hours to schedule your surgery. Please have the [facility] surgery sheet available when speaking with her.   Please call our office with any questions or concerns.

## 2020-07-25 ENCOUNTER — Telehealth: Payer: Self-pay | Admitting: Surgery

## 2020-07-25 NOTE — Telephone Encounter (Signed)
Spoke with niece, Lynelle Smoke, she has been advised of Pre-Admission date/time, COVID Testing date and Surgery date for her uncle.    Surgery Date: 08/10/20 Preadmission Testing Date: 08/04/20 (phone 8a-1p) Covid Testing Date: 08/08/20 - patient advised to go to the [place] between 8a-1p  They have been made aware to call [phone], between 1-3:00pm the day before surgery, to find out what time to arrive for surgery.

## 2020-07-26 ENCOUNTER — Encounter: Payer: Self-pay | Admitting: Surgery

## 2020-07-26 NOTE — H&P (View-Only) (Signed)
Surgical Consultation  07/26/2020  James Black is an 65 y.o. male.   Chief Complaint  Patient presents with  . New Patient (Initial Visit)    PG catheter consult     HPI: In consultation at the request of Dr. Candiss Norse. Does have a history of hypertension and chronic kidney disease that is worsening and about to require dialysis. He does weight loss. No fevers no chills normal appetite. He is able to perform more than 4 METS of activity without any shortness of breath or chest pain. He does have some cerebral palsy but he is competent. He lives with his niece. No prior abdominal operations. He did have an ultrasound of the kidneys personally reviewed showing no evidence of hydronephrosis or masses. Creatinine was 8.99 and his potassium was 5.4. Rest of the labs are pretty reasonable including a CBC. Continues to have proteinuria. He has also been started on [facility] daily  Past Medical History:  Diagnosis Date  . Arthritis   . Cerebral palsy ([place])   . Chronic kidney disease   . Deficiency of vitamin B12   . Encephalitis   . GERD (gastroesophageal reflux disease)   . Hemorrhoid   . Hypercholesterolemia   . Hypertension     Past Surgical History:  Procedure Laterality Date  . COLONOSCOPY WITH PROPOFOL N/A 05/25/2015   Procedure: COLONOSCOPY WITH PROPOFOL;  Surgeon: Manya Silvas, MD;  Location: [facility] ENDOSCOPY;  Service: Endoscopy;  Laterality: N/A;  . COLONOSCOPY WITH PROPOFOL N/A 05/12/2020   Procedure: COLONOSCOPY WITH PROPOFOL;  Surgeon: Lesly Rubenstein, MD;  Location: [facility] ENDOSCOPY;  Service: Endoscopy;  Laterality: N/A;  . ESOPHAGOGASTRODUODENOSCOPY (EGD) WITH PROPOFOL N/A 05/12/2020   Procedure: ESOPHAGOGASTRODUODENOSCOPY (EGD) WITH PROPOFOL;  Surgeon: Lesly Rubenstein, MD;  Location: [facility] ENDOSCOPY;  Service: Endoscopy;  Laterality: N/A;  . FRACTURE SURGERY      No family history on file.  Social History:  reports that he has never smoked. He has never used  smokeless tobacco. He reports that he does not drink alcohol and does not use drugs.  Allergies:  Allergies  Allergen Reactions  . Cefuroxime Rash  . Tramadol Nausea Only and Other (See Comments)    Dizziness    Medications reviewed.     ROS Full ROS performed and is otherwise negative other than what is stated in the HPI    BP 138/88   Pulse 73   Temp 98.2 F (36.8 C) (Oral)   Ht 5\' 6"  (1.676 m)   Wt 175 lb 3.2 oz (79.5 kg)   SpO2 100%   BMI 28.28 kg/m   Physical Exam Vitals and nursing note reviewed. Exam conducted with a chaperone present.  Constitutional:      General: He is not in acute distress.    Appearance: Normal appearance. He is normal weight. He is not toxic-appearing.  Eyes:     General:        Right eye: No discharge.        Left eye: No discharge.  Cardiovascular:     Rate and Rhythm: Normal rate and regular rhythm.     Heart sounds: No murmur heard.   Pulmonary:     Effort: Pulmonary effort is normal. No respiratory distress.     Breath sounds: Normal breath sounds. No stridor. No wheezing or rhonchi.  Abdominal:     General: Abdomen is flat. There is no distension.     Palpations: Abdomen is soft. There is no mass.  Tenderness: There is no abdominal tenderness. There is no guarding or rebound.     Hernia: No hernia is present.  Musculoskeletal:        General: No swelling or tenderness. Normal range of motion.     Cervical back: Normal range of motion and neck supple. No rigidity or tenderness.  Skin:    General: Skin is warm and dry.     Capillary Refill: Capillary refill takes less than 2 seconds.  Neurological:     General: No focal deficit present.     Mental Status: He is alert and oriented to person, place, and time.  Psychiatric:        Mood and Affect: Mood normal.        Behavior: Behavior normal.        Thought Content: Thought content normal.        Judgment: Judgment normal.       Assessment/Plan: ESRD. Patient  detail about different modalities of dialysis. An extensive discussion with him and his niece regarding peritoneal dialysis. I have also described to them little detail to place a PD catheter. Risks, benefits and possible implications including but not limited to: Bleeding, infection injury to adjacent organs and malfunction. They understand and are in agreement    Caroleen Hamman, MD [place] Surgeon

## 2020-07-26 NOTE — Progress Notes (Signed)
Surgical Consultation  07/26/2020  James Black is an 65 y.o. male.   Chief Complaint  Patient presents with   New Patient (Initial Visit)    PG catheter consult     HPI: In consultation at the request of Dr. Candiss Norse. Does have a history of hypertension and chronic kidney disease that is worsening and about to require dialysis. He does weight loss. No fevers no chills normal appetite. He is able to perform more than 4 METS of activity without any shortness of breath or chest pain. He does have some cerebral palsy but he is competent. He lives with his niece. No prior abdominal operations. He did have an ultrasound of the kidneys personally reviewed showing no evidence of hydronephrosis or masses. Creatinine was 8.99 and his potassium was 5.4. Rest of the labs are pretty reasonable including a CBC. Continues to have proteinuria. He has also been started on [facility] daily  Past Medical History:  Diagnosis Date   Arthritis    Cerebral palsy ([place])    Chronic kidney disease    Deficiency of vitamin B12    Encephalitis    GERD (gastroesophageal reflux disease)    Hemorrhoid    Hypercholesterolemia    Hypertension     Past Surgical History:  Procedure Laterality Date   COLONOSCOPY WITH PROPOFOL N/A 05/25/2015   Procedure: COLONOSCOPY WITH PROPOFOL;  Surgeon: Manya Silvas, MD;  Location: [facility];  Service: Endoscopy;  Laterality: N/A;   COLONOSCOPY WITH PROPOFOL N/A 05/12/2020   Procedure: COLONOSCOPY WITH PROPOFOL;  Surgeon: Lesly Rubenstein, MD;  Location: [facility] ENDOSCOPY;  Service: Endoscopy;  Laterality: N/A;   ESOPHAGOGASTRODUODENOSCOPY (EGD) WITH PROPOFOL N/A 05/12/2020   Procedure: ESOPHAGOGASTRODUODENOSCOPY (EGD) WITH PROPOFOL;  Surgeon: Lesly Rubenstein, MD;  Location: [facility] ENDOSCOPY;  Service: Endoscopy;  Laterality: N/A;   FRACTURE SURGERY      No family history on file.  Social History:  reports that he has never smoked. He has never used  smokeless tobacco. He reports that he does not drink alcohol and does not use drugs.  Allergies:  Allergies  Allergen Reactions   Cefuroxime Rash   Tramadol Nausea Only and Other (See Comments)    Dizziness    Medications reviewed.     ROS Full ROS performed and is otherwise negative other than what is stated in the HPI    BP 138/88    Pulse 73    Temp 98.2 F (36.8 C) (Oral)    Ht 5\' 6"  (1.676 m)    Wt 175 lb 3.2 oz (79.5 kg)    SpO2 100%    BMI 28.28 kg/m   Physical Exam Vitals and nursing note reviewed. Exam conducted with a chaperone present.  Constitutional:      General: He is not in acute distress.    Appearance: Normal appearance. He is normal weight. He is not toxic-appearing.  Eyes:     General:        Right eye: No discharge.        Left eye: No discharge.  Cardiovascular:     Rate and Rhythm: Normal rate and regular rhythm.     Heart sounds: No murmur heard.   Pulmonary:     Effort: Pulmonary effort is normal. No respiratory distress.     Breath sounds: Normal breath sounds. No stridor. No wheezing or rhonchi.  Abdominal:     General: Abdomen is flat. There is no distension.     Palpations: Abdomen is soft. There  is no mass.     Tenderness: There is no abdominal tenderness. There is no guarding or rebound.     Hernia: No hernia is present.  Musculoskeletal:        General: No swelling or tenderness. Normal range of motion.     Cervical back: Normal range of motion and neck supple. No rigidity or tenderness.  Skin:    General: Skin is warm and dry.     Capillary Refill: Capillary refill takes less than 2 seconds.  Neurological:     General: No focal deficit present.     Mental Status: He is alert and oriented to person, place, and time.  Psychiatric:        Mood and Affect: Mood normal.        Behavior: Behavior normal.        Thought Content: Thought content normal.        Judgment: Judgment normal.       Assessment/Plan: ESRD. Patient  detail about different modalities of dialysis. An extensive discussion with him and his niece regarding peritoneal dialysis. I have also described to them little detail to place a PD catheter. Risks, benefits and possible implications including but not limited to: Bleeding, infection injury to adjacent organs and malfunction. They understand and are in agreement    Caroleen Hamman, MD [place] Surgeon

## 2020-08-04 ENCOUNTER — Other Ambulatory Visit
Admission: RE | Admit: 2020-08-04 | Discharge: 2020-08-04 | Disposition: A | Discharge status: Home / Self Care | Payer: Medicare HMO | Source: Ambulatory Visit | Attending: Surgery | Admitting: Surgery

## 2020-08-04 ENCOUNTER — Other Ambulatory Visit: Payer: Self-pay

## 2020-08-04 ENCOUNTER — Telehealth: Payer: Self-pay

## 2020-08-04 DIAGNOSIS — Z01810 Encounter for preprocedural cardiovascular examination: Secondary | ICD-10-CM | POA: Diagnosis not present

## 2020-08-04 DIAGNOSIS — Z01818 Encounter for other preprocedural examination: Secondary | ICD-10-CM | POA: Diagnosis not present

## 2020-08-04 DIAGNOSIS — I10 Essential (primary) hypertension: Secondary | ICD-10-CM | POA: Insufficient documentation

## 2020-08-04 HISTORY — DX: Hyperkalemia: E87.5

## 2020-08-04 LAB — CBC
HCT: 28.2 % — ABNORMAL LOW (ref 39.0–52.0)
Hemoglobin: 9.9 g/dL — ABNORMAL LOW (ref 13.0–17.0)
MCH: 29.4 pg (ref 26.0–34.0)
MCHC: 35.1 g/dL (ref 30.0–36.0)
MCV: 83.7 fL (ref 80.0–100.0)
Platelets: 309 10*3/uL (ref 150–400)
RBC: 3.37 MIL/uL — ABNORMAL LOW (ref 4.22–5.81)
RDW: 15 % (ref 11.5–15.5)
WBC: 8.7 10*3/uL (ref 4.0–10.5)
nRBC: 0 % (ref 0.0–0.2)

## 2020-08-04 LAB — BASIC METABOLIC PANEL
Anion gap: 10 (ref 5–15)
BUN: 67 mg/dL — ABNORMAL HIGH (ref 8–23)
CO2: 18 mmol/L — ABNORMAL LOW (ref 22–32)
Calcium: 8 mg/dL — ABNORMAL LOW (ref 8.9–10.3)
Chloride: 112 mmol/L — ABNORMAL HIGH (ref 98–111)
Creatinine, Ser: 9.13 mg/dL — ABNORMAL HIGH (ref 0.61–1.24)
GFR, Estimated: 6 mL/min — ABNORMAL LOW (ref >60)
Glucose, Bld: 109 mg/dL — ABNORMAL HIGH (ref 70–99)
Potassium: 2.5 mmol/L — CL (ref 3.5–5.1)
Sodium: 140 mmol/L (ref 135–145)

## 2020-08-04 MED ORDER — POTASSIUM CHLORIDE CRYS ER 20 MEQ PO TBCR: 20.0000 meq | EXTENDED_RELEASE_TABLET | Freq: Two times a day (BID) | ORAL | 0 refills | Status: DC

## 2020-08-04 NOTE — Telephone Encounter (Signed)
DR.Singh's office closed at 12 today-they do not have on call provider listed- UI have faxed the Lab over to DR.Singh's office but they will not see this till Monday- patients surgery is scheduled 08/10/20. Please advise.

## 2020-08-04 NOTE — Pre-Procedure Instructions (Signed)
Labwork obtained today came back abnormal, specifically K+ level of 2.5 Dr. Rosey Bath (anesthesia) and Dr. Corlis Leak office made aware of these abnormal levels. Will check K+ level the day of surgery.

## 2020-08-04 NOTE — Patient Instructions (Addendum)
Your procedure is scheduled on:  Thursday, March 17 Report to the Registration Desk on the 1st floor of the [facility]. To find out your arrival time, please call [phone] between 1PM - 3PM on: Wednesday, March 16  REMEMBER: Instructions that are not followed completely may result in serious medical risk, up to and including death; or upon the discretion of your surgeon and anesthesiologist your surgery may need to be rescheduled.  Do not eat food after midnight the night before surgery.  No gum chewing, lozengers or hard candies.  You may however, drink CLEAR liquids up to 2 hours before you are scheduled to arrive for your surgery. Do not drink anything within 2 hours of your scheduled arrival time.  Clear liquids include: - water  - apple juice without pulp - gatorade (not RED, PURPLE, OR BLUE) - black coffee or tea (Do NOT add milk or creamers to the coffee or tea) Do NOT drink anything that is not on this list.  TAKE THESE MEDICATIONS THE MORNING OF SURGERY WITH A SIP OF WATER:  1.  Amlodipine 2.  cosopt eye drops 3.  Esomeprazole (Nexium) 4.  Metoprolol  One week prior to surgery: starting today, March 11 Stop Anti-inflammatories (NSAIDS) such as Advil, Aleve, Ibuprofen, Motrin, Naproxen, Naprosyn and Aspirin based products such as Excedrin, Goodys Powder, BC Powder. Stop ANY OVER THE COUNTER supplements until after surgery. May continue to take Tylenol if needed for pain.  No Alcohol for 24 hours before or after surgery.  No Smoking including e-cigarettes for 24 hours prior to surgery.  No chewable tobacco products for at least 6 hours prior to surgery.  No nicotine patches on the day of surgery.  Do not use any "recreational" drugs for at least a week prior to your surgery.  Please be advised that the combination of cocaine and anesthesia may have negative outcomes, up to and including death. If you test positive for cocaine, your surgery will be  cancelled.  On the morning of surgery brush your teeth with toothpaste and water, you may rinse your mouth with mouthwash if you wish. Do not swallow any toothpaste or mouthwash.  Do not wear jewelry, make-up, hairpins, clips or nail polish.  Do not wear lotions, powders, or perfumes.   Do not shave body from the neck down 48 hours prior to surgery just in case you cut yourself which could leave a site for infection.  Also, freshly shaved skin may become irritated if using the CHG soap.  Contact lenses, hearing aids and dentures may not be worn into surgery.  Do not bring valuables to the hospital. [facility] is not responsible for any missing/lost belongings or valuables.   Use CHG Soap as directed on instruction sheet.  Notify your doctor if there is any change in your medical condition (cold, fever, infection).  Wear comfortable clothing (specific to your surgery type) to the hospital.  Plan for stool softeners for home use; pain medications have a tendency to cause constipation. You can also help prevent constipation by eating foods high in fiber such as fruits and vegetables and drinking plenty of fluids as your diet allows.  After surgery, you can help prevent lung complications by doing breathing exercises.  Take deep breaths and cough every 1-2 hours. Your doctor may order a device called an Incentive Spirometer to help you take deep breaths. When coughing or sneezing, hold a pillow firmly against your incision with both hands. This is called "splinting." Doing  this helps protect your incision. It also decreases belly discomfort.  If you are being discharged the day of surgery, you will not be allowed to drive home. You will need a responsible adult (18 years or older) to drive you home and stay with you that night.   If you are taking public transportation, you will need to have a responsible adult (18 years or older) with you. Please confirm with your physician that it is  acceptable to use public transportation.   Please call the [facility] Dept. at [phone] if you have any questions about these instructions.  Surgery Visitation Policy:  Patients undergoing a surgery or procedure may have one family member or support person with them as long as that person is not COVID-19 positive or experiencing its symptoms.  That person may remain in the waiting area during the procedure.

## 2020-08-04 NOTE — Telephone Encounter (Signed)
Per Dr. Dahlia Byes, an Rx was sent in to [facility] for potassium 20 meq (take twice daily) and pt is to contact Dr. Keturah Barre office on Monday morning regarding pts potassium level.  Pt notified. Verbalizes understanding.

## 2020-08-08 ENCOUNTER — Other Ambulatory Visit
Admission: RE | Admit: 2020-08-08 | Discharge: 2020-08-08 | Disposition: A | Discharge status: Home / Self Care | Payer: Medicare HMO | Source: Ambulatory Visit | Attending: Surgery | Admitting: Surgery

## 2020-08-08 ENCOUNTER — Other Ambulatory Visit: Payer: Self-pay

## 2020-08-08 DIAGNOSIS — Z20822 Contact with and (suspected) exposure to covid-19: Secondary | ICD-10-CM | POA: Insufficient documentation

## 2020-08-08 DIAGNOSIS — N2581 Secondary hyperparathyroidism of renal origin: Secondary | ICD-10-CM | POA: Diagnosis not present

## 2020-08-08 DIAGNOSIS — R801 Persistent proteinuria, unspecified: Secondary | ICD-10-CM | POA: Diagnosis not present

## 2020-08-08 DIAGNOSIS — E875 Hyperkalemia: Secondary | ICD-10-CM | POA: Diagnosis not present

## 2020-08-08 DIAGNOSIS — N185 Chronic kidney disease, stage 5: Secondary | ICD-10-CM | POA: Diagnosis not present

## 2020-08-08 DIAGNOSIS — Z01812 Encounter for preprocedural laboratory examination: Secondary | ICD-10-CM | POA: Diagnosis not present

## 2020-08-08 DIAGNOSIS — I10 Essential (primary) hypertension: Secondary | ICD-10-CM | POA: Diagnosis not present

## 2020-08-08 DIAGNOSIS — N184 Chronic kidney disease, stage 4 (severe): Secondary | ICD-10-CM | POA: Diagnosis not present

## 2020-08-08 LAB — SARS CORONAVIRUS 2 (TAT 6-24 HRS): SARS Coronavirus 2: NEGATIVE

## 2020-08-09 MED ORDER — SODIUM CHLORIDE 0.9 % IV SOLN: INTRAVENOUS | Status: DC

## 2020-08-09 MED ORDER — CLINDAMYCIN PHOSPHATE 900 MG/50ML IV SOLN
900.0000 mg | INTRAVENOUS | Status: AC
  Administered 2020-08-10: 900 mg via INTRAVENOUS
  Filled 2020-08-09: qty 50

## 2020-08-09 MED ORDER — CHLORHEXIDINE GLUCONATE CLOTH 2 % EX PADS: 6.0000 | MEDICATED_PAD | Freq: Once | CUTANEOUS | Status: DC

## 2020-08-09 MED ORDER — GABAPENTIN 300 MG PO CAPS: 300.0000 mg | ORAL_CAPSULE | ORAL | Status: AC

## 2020-08-09 MED ORDER — ACETAMINOPHEN 500 MG PO TABS: 1000.0000 mg | ORAL_TABLET | ORAL | Status: AC

## 2020-08-10 ENCOUNTER — Ambulatory Visit: Payer: Medicare HMO | Admitting: Anesthesiology

## 2020-08-10 ENCOUNTER — Encounter
Admission: RE | Disposition: A | Discharge status: Home / Self Care | Payer: Self-pay | Source: Home / Self Care | Attending: Surgery

## 2020-08-10 ENCOUNTER — Ambulatory Visit
Admission: RE | Admit: 2020-08-10 | Discharge: 2020-08-10 | Disposition: A | Discharge status: Home / Self Care | Payer: Medicare HMO | Attending: Surgery | Admitting: Surgery

## 2020-08-10 ENCOUNTER — Encounter: Payer: Self-pay | Admitting: Surgery

## 2020-08-10 ENCOUNTER — Other Ambulatory Visit: Payer: Self-pay

## 2020-08-10 DIAGNOSIS — I120 Hypertensive chronic kidney disease with stage 5 chronic kidney disease or end stage renal disease: Secondary | ICD-10-CM | POA: Insufficient documentation

## 2020-08-10 DIAGNOSIS — G809 Cerebral palsy, unspecified: Secondary | ICD-10-CM | POA: Diagnosis not present

## 2020-08-10 DIAGNOSIS — N186 End stage renal disease: Secondary | ICD-10-CM | POA: Insufficient documentation

## 2020-08-10 DIAGNOSIS — K668 Other specified disorders of peritoneum: Secondary | ICD-10-CM

## 2020-08-10 DIAGNOSIS — E7800 Pure hypercholesterolemia, unspecified: Secondary | ICD-10-CM | POA: Diagnosis not present

## 2020-08-10 DIAGNOSIS — D631 Anemia in chronic kidney disease: Secondary | ICD-10-CM | POA: Diagnosis not present

## 2020-08-10 DIAGNOSIS — K219 Gastro-esophageal reflux disease without esophagitis: Secondary | ICD-10-CM | POA: Diagnosis not present

## 2020-08-10 HISTORY — PX: CAPD INSERTION: SHX5233

## 2020-08-10 LAB — POCT I-STAT, CHEM 8
BUN: 53 mg/dL — ABNORMAL HIGH (ref 8–23)
Calcium, Ion: 1.23 mmol/L (ref 1.15–1.40)
Chloride: 115 mmol/L — ABNORMAL HIGH (ref 98–111)
Creatinine, Ser: 9.5 mg/dL — ABNORMAL HIGH (ref 0.61–1.24)
Glucose, Bld: 97 mg/dL (ref 70–99)
HCT: 26 % — ABNORMAL LOW (ref 39.0–52.0)
Hemoglobin: 8.8 g/dL — ABNORMAL LOW (ref 13.0–17.0)
Potassium: 4.1 mmol/L (ref 3.5–5.1)
Sodium: 140 mmol/L (ref 135–145)
TCO2: 14 mmol/L — ABNORMAL LOW (ref 22–32)

## 2020-08-10 SURGERY — LAPAROSCOPIC INSERTION CONTINUOUS AMBULATORY PERITONEAL DIALYSIS  (CAPD) CATHETER
Anesthesia: General

## 2020-08-10 MED ORDER — HEPARIN SODIUM (PORCINE) 10000 UNIT/ML IJ SOLN
INTRAMUSCULAR | Status: AC
  Filled 2020-08-10: qty 1

## 2020-08-10 MED ORDER — FENTANYL CITRATE (PF) 100 MCG/2ML IJ SOLN
INTRAMUSCULAR | Status: AC
  Filled 2020-08-10: qty 2

## 2020-08-10 MED ORDER — ACETAMINOPHEN 325 MG PO TABS
ORAL_TABLET | ORAL | Status: AC
  Filled 2020-08-10: qty 2

## 2020-08-10 MED ORDER — HYDROCODONE-ACETAMINOPHEN 5-325 MG PO TABS: 1.0000 | ORAL_TABLET | Freq: Four times a day (QID) | ORAL | 0 refills | Status: DC | PRN

## 2020-08-10 MED ORDER — CLINDAMYCIN PHOSPHATE 300 MG/50ML IV SOLN
INTRAVENOUS | Status: AC
  Filled 2020-08-10: qty 50

## 2020-08-10 MED ORDER — PROPOFOL 10 MG/ML IV BOLUS
INTRAVENOUS | Status: DC | PRN
  Administered 2020-08-10: 120 mg via INTRAVENOUS
  Administered 2020-08-10: 30 mg via INTRAVENOUS

## 2020-08-10 MED ORDER — MIDAZOLAM HCL 2 MG/2ML IJ SOLN
INTRAMUSCULAR | Status: DC | PRN
  Administered 2020-08-10: 2 mg via INTRAVENOUS

## 2020-08-10 MED ORDER — EPHEDRINE SULFATE 50 MG/ML IJ SOLN
INTRAMUSCULAR | Status: DC | PRN
  Administered 2020-08-10 (×2): 5 mg via INTRAVENOUS

## 2020-08-10 MED ORDER — FENTANYL CITRATE (PF) 100 MCG/2ML IJ SOLN
INTRAMUSCULAR | Status: DC | PRN
  Administered 2020-08-10: 50 ug via INTRAVENOUS

## 2020-08-10 MED ORDER — LIDOCAINE HCL (CARDIAC) PF 100 MG/5ML IV SOSY
PREFILLED_SYRINGE | INTRAVENOUS | Status: DC | PRN
  Administered 2020-08-10: 80 mg via INTRAVENOUS

## 2020-08-10 MED ORDER — PROPOFOL 10 MG/ML IV BOLUS
INTRAVENOUS | Status: AC
  Filled 2020-08-10: qty 20

## 2020-08-10 MED ORDER — BUPIVACAINE-EPINEPHRINE 0.25% -1:200000 IJ SOLN
INTRAMUSCULAR | Status: DC | PRN
  Administered 2020-08-10: 30 mL

## 2020-08-10 MED ORDER — ONDANSETRON HCL 4 MG/2ML IJ SOLN
INTRAMUSCULAR | Status: AC
  Filled 2020-08-10: qty 2

## 2020-08-10 MED ORDER — ORAL CARE MOUTH RINSE: 15.0000 mL | Freq: Once | OROMUCOSAL | Status: DC

## 2020-08-10 MED ORDER — GABAPENTIN 300 MG PO CAPS
ORAL_CAPSULE | ORAL | Status: AC
  Filled 2020-08-10: qty 1

## 2020-08-10 MED ORDER — PHENYLEPHRINE HCL (PRESSORS) 10 MG/ML IV SOLN
INTRAVENOUS | Status: DC | PRN
  Administered 2020-08-10: 300 ug via INTRAVENOUS
  Administered 2020-08-10: 100 ug via INTRAVENOUS
  Administered 2020-08-10: 200 ug via INTRAVENOUS
  Administered 2020-08-10: 100 ug via INTRAVENOUS
  Administered 2020-08-10: 200 ug via INTRAVENOUS
  Administered 2020-08-10: 100 ug via INTRAVENOUS
  Administered 2020-08-10: 200 ug via INTRAVENOUS
  Administered 2020-08-10: 100 ug via INTRAVENOUS
  Administered 2020-08-10: 200 ug via INTRAVENOUS

## 2020-08-10 MED ORDER — SODIUM CHLORIDE 0.9 % IV BOLUS
250.0000 mL | Freq: Once | INTRAVENOUS | Status: AC
  Administered 2020-08-10: 250 mL via INTRAVENOUS

## 2020-08-10 MED ORDER — SUGAMMADEX SODIUM 200 MG/2ML IV SOLN
INTRAVENOUS | Status: DC | PRN
  Administered 2020-08-10: 200 mg via INTRAVENOUS

## 2020-08-10 MED ORDER — BUPIVACAINE LIPOSOME 1.3 % IJ SUSP
INTRAMUSCULAR | Status: AC
  Filled 2020-08-10: qty 20

## 2020-08-10 MED ORDER — CHLORHEXIDINE GLUCONATE 0.12 % MT SOLN: 15.0000 mL | Freq: Once | OROMUCOSAL | Status: DC

## 2020-08-10 MED ORDER — ONDANSETRON HCL 4 MG/2ML IJ SOLN
4.0000 mg | Freq: Once | INTRAMUSCULAR | Status: AC | PRN
  Administered 2020-08-10: 4 mg via INTRAVENOUS

## 2020-08-10 MED ORDER — VASOPRESSIN 20 UNIT/ML IV SOLN
INTRAVENOUS | Status: DC | PRN
  Administered 2020-08-10: 2 [IU] via INTRAVENOUS
  Administered 2020-08-10: 1 [IU] via INTRAVENOUS
  Administered 2020-08-10: 2 [IU] via INTRAVENOUS
  Administered 2020-08-10 (×2): 1 [IU] via INTRAVENOUS

## 2020-08-10 MED ORDER — BUPIVACAINE LIPOSOME 1.3 % IJ SUSP
INTRAMUSCULAR | Status: DC | PRN
  Administered 2020-08-10: 20 mL

## 2020-08-10 MED ORDER — ACETAMINOPHEN 500 MG PO TABS
ORAL_TABLET | ORAL | Status: AC
  Administered 2020-08-10: 1000 mg via ORAL
  Filled 2020-08-10: qty 2

## 2020-08-10 MED ORDER — SODIUM CHLORIDE 0.9 % IV SOLN
INTRAVENOUS | Status: DC | PRN
  Administered 2020-08-10: 50 mL/h via INTRAVENOUS

## 2020-08-10 MED ORDER — ROCURONIUM BROMIDE 100 MG/10ML IV SOLN
INTRAVENOUS | Status: DC | PRN
  Administered 2020-08-10: 50 mg via INTRAVENOUS
  Administered 2020-08-10: 10 mg via INTRAVENOUS
  Administered 2020-08-10: 20 mg via INTRAVENOUS

## 2020-08-10 MED ORDER — BUPIVACAINE-EPINEPHRINE (PF) 0.25% -1:200000 IJ SOLN
INTRAMUSCULAR | Status: AC
  Filled 2020-08-10: qty 30

## 2020-08-10 MED ORDER — ACETAMINOPHEN 160 MG/5ML PO SOLN
325.0000 mg | ORAL | Status: DC | PRN
Start: 2020-08-10 — End: 2020-08-10
  Filled 2020-08-10: qty 20.3

## 2020-08-10 MED ORDER — ACETAMINOPHEN 325 MG PO TABS
325.0000 mg | ORAL_TABLET | ORAL | Status: DC | PRN
Start: 2020-08-10 — End: 2020-08-10
  Administered 2020-08-10: 650 mg via ORAL

## 2020-08-10 MED ORDER — MIDAZOLAM HCL 2 MG/2ML IJ SOLN
INTRAMUSCULAR | Status: AC
  Filled 2020-08-10: qty 2

## 2020-08-10 MED ORDER — CHLORHEXIDINE GLUCONATE 0.12 % MT SOLN
OROMUCOSAL | Status: AC
  Administered 2020-08-10: 15 mL
  Filled 2020-08-10: qty 15

## 2020-08-10 MED ORDER — FENTANYL CITRATE (PF) 100 MCG/2ML IJ SOLN
25.0000 ug | INTRAMUSCULAR | Status: DC | PRN
Start: 2020-08-10 — End: 2020-08-10
  Administered 2020-08-10: 25 ug via INTRAVENOUS

## 2020-08-10 MED ORDER — GABAPENTIN 300 MG PO CAPS
ORAL_CAPSULE | ORAL | Status: AC
  Administered 2020-08-10: 300 mg via ORAL
  Filled 2020-08-10: qty 1

## 2020-08-10 SURGICAL SUPPLY — 55 items
"PENCIL ELECTRO HAND CTR " (MISCELLANEOUS) ×1 IMPLANT
ADAPTER CATH DIALYSIS 4X8 IT L (MISCELLANEOUS) ×2 IMPLANT
ADH SKN CLS APL DERMABOND .7 (GAUZE/BANDAGES/DRESSINGS) ×1
APL PRP STRL LF DISP 70% ISPRP (MISCELLANEOUS) ×1
BIOPATCH WHT 1IN DISK W/4.0 H (GAUZE/BANDAGES/DRESSINGS) ×2 IMPLANT
BLADE CLIPPER SURG (BLADE) ×1 IMPLANT
CANISTER SUCT 1200ML W/VALVE (MISCELLANEOUS) ×2 IMPLANT
CATH EXTENDED DIALYSIS (CATHETERS) ×2 IMPLANT
CHLORAPREP W/TINT 26 (MISCELLANEOUS) ×2 IMPLANT
COVER WAND RF STERILE (DRAPES) ×2 IMPLANT
DEFOGGER SCOPE WARMER CLEARIFY (MISCELLANEOUS) ×2 IMPLANT
DERMABOND ADVANCED (GAUZE/BANDAGES/DRESSINGS) ×1
DERMABOND ADVANCED .7 DNX12 (GAUZE/BANDAGES/DRESSINGS) ×1 IMPLANT
ELECT CAUTERY BLADE 6.4 (BLADE) ×2 IMPLANT
ELECT REM PT RETURN 9FT ADLT (ELECTROSURGICAL) ×2
ELECTRODE REM PT RTRN 9FT ADLT (ELECTROSURGICAL) ×1 IMPLANT
GLOVE SURG ENC MOIS LTX SZ7 (GLOVE) ×2 IMPLANT
GOWN STRL REUS W/ TWL LRG LVL3 (GOWN DISPOSABLE) ×2 IMPLANT
GOWN STRL REUS W/TWL LRG LVL3 (GOWN DISPOSABLE) ×4
GRASPER SUT TROCAR 14GX15 (MISCELLANEOUS) ×2 IMPLANT
IV NS 1000ML (IV SOLUTION) ×2
IV NS 1000ML BAXH (IV SOLUTION) ×1 IMPLANT
KIT TURNOVER KIT A (KITS) ×2 IMPLANT
MANIFOLD NEPTUNE II (INSTRUMENTS) ×2 IMPLANT
MINICAP W/POVIDONE IODINE SOL (MISCELLANEOUS) ×2 IMPLANT
NDL INSUFFLATION 14GA 120MM (NEEDLE) ×1 IMPLANT
NDL SAFETY ECLIPSE 18X1.5 (NEEDLE) ×1 IMPLANT
NEEDLE HYPO 18GX1.5 SHARP (NEEDLE) ×2
NEEDLE HYPO 22GX1.5 SAFETY (NEEDLE) ×2 IMPLANT
NEEDLE INSUFFLATION 14GA 120MM (NEEDLE) ×2 IMPLANT
NS IRRIG 500ML POUR BTL (IV SOLUTION) ×2 IMPLANT
PACK LAP CHOLECYSTECTOMY (MISCELLANEOUS) ×2 IMPLANT
PENCIL ELECTRO HAND CTR (MISCELLANEOUS) ×2 IMPLANT
SET CYSTO W/LG BORE CLAMP LF (SET/KITS/TRAYS/PACK) ×2 IMPLANT
SET TRANSFER 6 W/TWIST CLAMP 5 (SET/KITS/TRAYS/PACK) ×2 IMPLANT
SET TUBE SMOKE EVAC HIGH FLOW (TUBING) ×2 IMPLANT
SLEEVE ADV FIXATION 5X100MM (TROCAR) ×2 IMPLANT
SPONGE DRAIN TRACH 4X4 STRL 2S (GAUZE/BANDAGES/DRESSINGS) ×2 IMPLANT
SPONGE LAP 18X18 RF (DISPOSABLE) ×2 IMPLANT
STYLET FALLER (MISCELLANEOUS) ×2 IMPLANT
STYLET FALLER MEDIONICS (MISCELLANEOUS) ×2 IMPLANT
SUT ETHILON 3-0 FS-10 30 BLK (SUTURE) ×2
SUT MNCRL 4-0 (SUTURE) ×2
SUT MNCRL 4-0 27XMFL (SUTURE) ×1
SUT VIC AB 3-0 SH 27 (SUTURE) ×2
SUT VIC AB 3-0 SH 27X BRD (SUTURE) ×1 IMPLANT
SUT VICRYL 0 AB UR-6 (SUTURE) ×4 IMPLANT
SUTURE EHLN 3-0 FS-10 30 BLK (SUTURE) ×1 IMPLANT
SUTURE MNCRL 4-0 27XMF (SUTURE) ×1 IMPLANT
SYR 20ML LL LF (SYRINGE) ×2 IMPLANT
SYR 3ML LL SCALE MARK (SYRINGE) ×2 IMPLANT
SYS KII FIOS ACCESS ABD 5X100 (TROCAR) ×2
SYSTEM KII FIOS ACES ABD 5X100 (TROCAR) ×1 IMPLANT
TROCAR BALLN GELPORT 12X130M (ENDOMECHANICALS) ×2 IMPLANT
TROCAR XCEL NON-BLD 5MMX100MML (ENDOMECHANICALS) ×2 IMPLANT

## 2020-08-10 NOTE — Transfer of Care (Signed)
Immediate Anesthesia Transfer of Care Note  Patient: James Black  Procedure(s) Performed: LAPAROSCOPIC INSERTION CONTINUOUS AMBULATORY PERITONEAL DIALYSIS  (CAPD) CATHETER (N/A )  Patient Location: PACU  Anesthesia Type:General  Level of Consciousness: sedated  Airway & Oxygen Therapy: Patient Spontanous Breathing and Patient connected to face mask oxygen  Post-op Assessment: Report given to RN and Post -op Vital signs reviewed and stable  Post vital signs: Reviewed and stable  Last Vitals:  Vitals Value Taken Time  BP 102/71 08/10/20 1147  Temp 36.2 C 08/10/20 1147  Pulse 68 08/10/20 1151  Resp 17 08/10/20 1151  SpO2 100 % 08/10/20 1151  Vitals shown include unvalidated device data.  Last Pain:  Vitals:   08/10/20 1147  TempSrc:   PainSc: Asleep      Patients Stated Pain Goal: 0 (37/62/83 1517)  Complications: No complications documented.

## 2020-08-10 NOTE — Anesthesia Preprocedure Evaluation (Signed)
Anesthesia Evaluation  Patient identified by MRN, date of birth, ID band Patient awake  General Assessment Comment:Repeat K=4.1  Reviewed: Allergy & Precautions, H&P , NPO status , reviewed documented beta blocker date and time   Airway Mallampati: II  TM Distance: >3 FB Neck ROM: limited  Mouth opening: Limited Mouth Opening  Dental  (+) Poor Dentition, Chipped, Missing, Caps   Pulmonary    Pulmonary exam normal        Cardiovascular hypertension, Normal cardiovascular exam     Neuro/Psych    GI/Hepatic GERD  Medicated and Controlled,  Endo/Other    Renal/GU Renal disease     Musculoskeletal  (+) Arthritis ,   Abdominal   Peds  Hematology  (+) Blood dyscrasia, anemia ,   Anesthesia Other Findings Past Medical History: No date: Arthritis No date: Cerebral palsy (HCC) No date: Chronic kidney disease No date: Deficiency of vitamin B12 No date: Encephalitis No date: GERD (gastroesophageal reflux disease) No date: Hemorrhoid No date: Hypercholesterolemia No date: Hyperkalemia No date: Hypertension Past Surgical History: 05/25/2015: COLONOSCOPY WITH PROPOFOL; N/A     Comment:  Procedure: COLONOSCOPY WITH PROPOFOL;  Surgeon: Manya Silvas, MD;  Location: [facility] ENDOSCOPY;  Service:               Endoscopy;  Laterality: N/A; 05/12/2020: COLONOSCOPY WITH PROPOFOL; N/A     Comment:  Procedure: COLONOSCOPY WITH PROPOFOL;  Surgeon:               Lesly Rubenstein, MD;  Location: [facility] ENDOSCOPY;                Service: Endoscopy;  Laterality: N/A; 05/12/2020: ESOPHAGOGASTRODUODENOSCOPY (EGD) WITH PROPOFOL; N/A     Comment:  Procedure: ESOPHAGOGASTRODUODENOSCOPY (EGD) WITH               PROPOFOL;  Surgeon: Lesly Rubenstein, MD;  Location:               [facility] ENDOSCOPY;  Service: Endoscopy;  Laterality: N/A; 1997: FRACTURE SURGERY; Left     Comment:  arm BMI    Body Mass Index: 28.11 kg/m      Reproductive/Obstetrics                             Anesthesia Physical Anesthesia Plan  ASA: III  Anesthesia Plan: General ETT   Post-op Pain Management:    Induction: Intravenous  PONV Risk Score and Plan: Ondansetron, Treatment may vary due to age or medical condition and Midazolam  Airway Management Planned: Oral ETT  Additional Equipment:   Intra-op Plan:   Post-operative Plan: Extubation in OR  Informed Consent: I have reviewed the patients History and Physical, chart, labs and discussed the procedure including the risks, benefits and alternatives for the proposed anesthesia with the patient or authorized representative who has indicated his/her understanding and acceptance.     Dental Advisory Given  Plan Discussed with: CRNA  Anesthesia Plan Comments:         Anesthesia Quick Evaluation

## 2020-08-10 NOTE — Anesthesia Postprocedure Evaluation (Signed)
Anesthesia Post Note  Patient: James Black  Procedure(s) Performed: LAPAROSCOPIC INSERTION CONTINUOUS AMBULATORY PERITONEAL DIALYSIS  (CAPD) CATHETER (N/A )  Patient location during evaluation: PACU Anesthesia Type: General Level of consciousness: awake and alert Pain management: pain level controlled Vital Signs Assessment: post-procedure vital signs reviewed and stable Respiratory status: spontaneous breathing, nonlabored ventilation and respiratory function stable Cardiovascular status: blood pressure returned to baseline and stable Postop Assessment: no apparent nausea or vomiting Anesthetic complications: no   No complications documented.   Last Vitals:  Vitals:   08/10/20 1416 08/10/20 1442  BP: 95/69 104/67  Pulse: 72 71  Resp: 14 16  Temp: (!) 36 C   SpO2: 100% 100%    Last Pain:  Vitals:   08/10/20 1416  TempSrc: Temporal  PainSc:                  Alphonsus Sias

## 2020-08-10 NOTE — Progress Notes (Signed)
Discussed pt with dr Lavone Neri.  BP stable in mid 94Q systolic with MAP mid 34F.  Pt mentation at baseline with palpable radial pulses.  Temperature maintained above 97.  Per dr Lavone Neri pt ok for discharge.

## 2020-08-10 NOTE — Anesthesia Procedure Notes (Signed)
Procedure Name: Intubation Date/Time: 08/10/2020 9:53 AM Performed by: Doreen Salvage, CRNA Pre-anesthesia Checklist: Patient identified, Patient being monitored, Timeout performed, Emergency Drugs available and Suction available Patient Re-evaluated:Patient Re-evaluated prior to induction Oxygen Delivery Method: Circle system utilized Preoxygenation: Pre-oxygenation with 100% oxygen Induction Type: IV induction Ventilation: Mask ventilation without difficulty Laryngoscope Size: Mac, McGraph and 4 Grade View: Grade I Tube type: Oral Tube size: 7.5 mm Number of attempts: 1 Airway Equipment and Method: Stylet Placement Confirmation: ETT inserted through vocal cords under direct vision,  positive ETCO2 and breath sounds checked- equal and bilateral Secured at: 20 cm Tube secured with: Tape Dental Injury: Teeth and Oropharynx as per pre-operative assessment

## 2020-08-10 NOTE — Op Note (Signed)
Laparoscopic placement of peritoneal Dialysis catheter ( Total three cuffs) Laparoscopic Omentopexy   Pre-operative Diagnosis: ESRD   Post-operative Diagnosis: same     Surgeon: Caroleen Hamman, MD FACS   Anesthesia: Gen. with endotracheal tube      Findings: Catheter within pelvis Good return after infusing Peritoneal cavity No evidence of intra-abdominal injuries   Estimated Blood Loss: 5cc              Complications: none     Procedure Details  The patient was seen again in the Holding Room. The benefits, complications, treatment options, and expected outcomes were discussed with the patient. The risks of bleeding, infection, recurrence of symptoms, failure to resolve symptoms, catheter malfunction bowel injury, any of which could require further surgery were reviewed with the patient. The likelihood of improving the patient's symptoms with return to their baseline status is good.  The patient and/or family concurred with the proposed plan, giving informed consent.  The patient was taken to Operating Room, identified and the procedure verified. A Time Out was held and the above information confirmed.   Prior to the induction of general anesthesia, antibiotic prophylaxis was administered. VTE prophylaxis was in place. General endotracheal anesthesia was then administered and tolerated well. After the induction, the abdomen was prepped with Chloraprep and draped in the sterile fashion. The patient was positioned in the supine position.   Periumbilical incision created to the left of the midline.   The anterior rectus fascia identified and incised, rectus muscle identified and retracted laterally, using a port We were able to tunnel into the retro rectus space for about 6 cms. Peritoneum was pierced off the midline. Pneumoperitoneum was obtained w/o hemodynamic changes. We placed two additional laparoscopic ports under direct visualization.   We were able to thread the catheter via the  laparoscopic port within the retrorectus space in the standard fashion.  Under direct visualization we made sure that the coil portion of the catheter layed within the pelvis without any kinks.  . I was able to also perform a counterincision in the left subcostal area and Tailored an additional extension of the catheter . The  Extension had 2 cuffs and I was able to connect it to parse together with the titanium connector in the standard fashion.  There was no evidence of any kinks.  The exit site was to the left upper quadrant. We instilled heparinized saline a liter into the pelvis and we had very good return. He did have generous omentum, attention was then turned to the omentum and using a PMI device we were able to perform an omentopexy and tacked the omentum to the abdominal wall using 2 interrupted 2-0 Vicryl sutures in the standard fashion. All skin incisions  were infiltrated with a liposomal Marcaine. 4-0 subcuticular Monocryl was used to close the skin. Dermabond was  applied. Sterile dressing applied to the catheter. The patient was then extubated and brought to the recovery room in stable condition. Sponge, lap, and needle counts were correct at closure and at the conclusion of the case.               Caroleen Hamman, MD, FACS

## 2020-08-10 NOTE — Discharge Instructions (Addendum)
AMBULATORY SURGERY  DISCHARGE INSTRUCTIONS   1) The drugs that you were given will stay in your system until tomorrow so for the next 24 hours you should not:  A) Drive an automobile B) Make any legal decisions C) Drink any alcoholic beverage   2) You may resume regular meals tomorrow.  Today it is better to start with liquids and gradually work up to solid foods.  You may eat anything you prefer, but it is better to start with liquids, then soup and crackers, and gradually work up to solid foods.   3) Please notify your doctor immediately if you have any unusual bleeding, trouble breathing, redness and pain at the surgery site, drainage, fever, or pain not relieved by medication.  4) Additional Instructions:    Hospital main number [phone] Bupivacaine Liposomal Suspension for Injection  LEAVE GREEN ARMBAND ON FOR 4 DAYS What is this medicine? BUPIVACAINE LIPOSOMAL (bue PIV a kane LIP oh som al) is an anesthetic. It causes loss of feeling in the skin or other tissues. It is used to prevent and to treat pain from some procedures. This medicine may be used for other purposes; ask your health care provider or pharmacist if you have questions. COMMON BRAND NAME(S): EXPAREL What should I tell my health care provider before I take this medicine? They need to know if you have any of these conditions:  G6PD deficiency  heart disease  kidney disease  liver disease  low blood pressure  lung or breathing disease, like asthma  an unusual or allergic reaction to bupivacaine, other medicines, foods, dyes, or preservatives  pregnant or trying to get pregnant  breast-feeding How should I use this medicine? This medicine is injected into the affected area. It is given by a health care provider in a hospital or clinic setting. Talk to your health care provider about the use of this medicine in children. While it may be given to children as young as 6 years for selected  conditions, precautions do apply. Overdosage: If you think you have taken too much of this medicine contact a poison control center or emergency room at once. NOTE: This medicine is only for you. Do not share this medicine with others. What if I miss a dose? This does not apply. What may interact with this medicine? This medicine may interact with the following medications:  acetaminophen  certain antibiotics like dapsone, nitrofurantoin, aminosalicylic acid, sulfonamides  certain medicines for seizures like phenobarbital, phenytoin, valproic acid  chloroquine  cyclophosphamide  flutamide  hydroxyurea  ifosfamide  metoclopramide  nitric oxide  nitroglycerin  nitroprusside  nitrous oxide  other local anesthetics like lidocaine, pramoxine, tetracaine  primaquine  quinine  rasburicase  sulfasalazine This list may not describe all possible interactions. Give your health care provider a list of all the medicines, herbs, non-prescription drugs, or dietary supplements you use. Also tell them if you smoke, drink alcohol, or use illegal drugs. Some items may interact with your medicine. What should I watch for while using this medicine? Your condition will be monitored carefully while you are receiving this medicine. Be careful to avoid injury while the area is numb, and you are not aware of pain. What side effects may I notice from receiving this medicine? Side effects that you should report to your doctor or health care professional as soon as possible:  allergic reactions like skin rash, itching or hives, swelling of the face, lips, or tongue  seizures  signs and symptoms of a dangerous  change in heartbeat or heart rhythm like chest pain; dizziness; fast, irregular heartbeat; palpitations; feeling faint or lightheaded; falls; breathing problems  signs and symptoms of methemoglobinemia such as pale, gray, or blue colored skin; headache; fast heartbeat; shortness of  breath; feeling faint or lightheaded, falls; tiredness Side effects that usually do not require medical attention (report to your doctor or health care professional if they continue or are bothersome):  anxious  back pain  changes in taste  changes in vision  constipation  dizziness  fever  nausea, vomiting This list may not describe all possible side effects. Call your doctor for medical advice about side effects. You may report side effects to FDA at 1-800-FDA-1088. Where should I keep my medicine? This drug is given in a hospital or clinic and will not be stored at home. NOTE: This sheet is a summary. It may not cover all possible information. If you have questions about this medicine, talk to your doctor, pharmacist, or health care provider.  2021 Elsevier/Gold Standard (2019-08-19 12:24:57)

## 2020-08-10 NOTE — Interval H&P Note (Signed)
History and Physical Interval Note:  08/10/2020 9:14 AM  James Black  has presented today for surgery, with the diagnosis of ESRD.  The various methods of treatment have been discussed with the patient and family. After consideration of risks, benefits and other options for treatment, the patient has consented to  Procedure(s): [place]  (CAPD) CATHETER (N/A) as a surgical intervention.  The patient's history has been reviewed, patient examined, no change in status, stable for surgery.  I have reviewed the patient's chart and labs.  Questions were answered to the patient's satisfaction.     Maud

## 2020-08-11 ENCOUNTER — Encounter: Payer: Self-pay | Admitting: Surgery

## 2020-08-15 DIAGNOSIS — N2581 Secondary hyperparathyroidism of renal origin: Secondary | ICD-10-CM | POA: Diagnosis not present

## 2020-08-15 DIAGNOSIS — E875 Hyperkalemia: Secondary | ICD-10-CM | POA: Diagnosis not present

## 2020-08-15 DIAGNOSIS — N185 Chronic kidney disease, stage 5: Secondary | ICD-10-CM | POA: Diagnosis not present

## 2020-08-15 DIAGNOSIS — I10 Essential (primary) hypertension: Secondary | ICD-10-CM | POA: Diagnosis not present

## 2020-08-15 DIAGNOSIS — E872 Acidosis: Secondary | ICD-10-CM | POA: Diagnosis not present

## 2020-09-04 ENCOUNTER — Other Ambulatory Visit: Payer: Self-pay

## 2020-09-04 ENCOUNTER — Encounter: Payer: Self-pay | Admitting: Surgery

## 2020-09-04 ENCOUNTER — Ambulatory Visit (INDEPENDENT_AMBULATORY_CARE_PROVIDER_SITE_OTHER): Payer: Medicare HMO | Admitting: Surgery

## 2020-09-04 VITALS — BP 134/87 | HR 78 | Temp 98.5°F | Ht 66.0 in | Wt 171.0 lb

## 2020-09-04 DIAGNOSIS — Z09 Encounter for follow-up examination after completed treatment for conditions other than malignant neoplasm: Secondary | ICD-10-CM

## 2020-09-04 NOTE — Progress Notes (Signed)
S/p PD placment Doing well Doing flushes No fevers, no pain  PE NAD Abd: soft. Cath in place w/o infections. Incisions healed  A/p Doing well No complications RTC prn

## 2020-09-04 NOTE — Patient Instructions (Signed)
   Follow-up with our office as needed.  Please call and ask to speak with a nurse if you develop questions or concerns.  

## 2020-10-02 DIAGNOSIS — N185 Chronic kidney disease, stage 5: Secondary | ICD-10-CM | POA: Diagnosis not present

## 2020-10-02 DIAGNOSIS — N186 End stage renal disease: Secondary | ICD-10-CM | POA: Diagnosis not present

## 2020-10-02 DIAGNOSIS — Z992 Dependence on renal dialysis: Secondary | ICD-10-CM | POA: Diagnosis not present

## 2020-10-02 DIAGNOSIS — I10 Essential (primary) hypertension: Secondary | ICD-10-CM | POA: Diagnosis not present

## 2020-10-03 DIAGNOSIS — Z79899 Other long term (current) drug therapy: Secondary | ICD-10-CM | POA: Diagnosis not present

## 2020-10-03 DIAGNOSIS — Z992 Dependence on renal dialysis: Secondary | ICD-10-CM | POA: Diagnosis not present

## 2020-10-03 DIAGNOSIS — Z131 Encounter for screening for diabetes mellitus: Secondary | ICD-10-CM | POA: Diagnosis not present

## 2020-10-03 DIAGNOSIS — Z5181 Encounter for therapeutic drug level monitoring: Secondary | ICD-10-CM | POA: Diagnosis not present

## 2020-10-03 DIAGNOSIS — N186 End stage renal disease: Secondary | ICD-10-CM | POA: Diagnosis not present

## 2020-10-03 DIAGNOSIS — E785 Hyperlipidemia, unspecified: Secondary | ICD-10-CM | POA: Diagnosis not present

## 2020-10-04 DIAGNOSIS — Z992 Dependence on renal dialysis: Secondary | ICD-10-CM | POA: Diagnosis not present

## 2020-10-04 DIAGNOSIS — N186 End stage renal disease: Secondary | ICD-10-CM | POA: Diagnosis not present

## 2020-10-05 DIAGNOSIS — N186 End stage renal disease: Secondary | ICD-10-CM | POA: Diagnosis not present

## 2020-10-05 DIAGNOSIS — Z992 Dependence on renal dialysis: Secondary | ICD-10-CM | POA: Diagnosis not present

## 2020-10-09 DIAGNOSIS — N186 End stage renal disease: Secondary | ICD-10-CM | POA: Diagnosis not present

## 2020-10-09 DIAGNOSIS — Z992 Dependence on renal dialysis: Secondary | ICD-10-CM | POA: Diagnosis not present

## 2020-10-10 DIAGNOSIS — N186 End stage renal disease: Secondary | ICD-10-CM | POA: Diagnosis not present

## 2020-10-10 DIAGNOSIS — Z992 Dependence on renal dialysis: Secondary | ICD-10-CM | POA: Diagnosis not present

## 2020-10-11 DIAGNOSIS — N186 End stage renal disease: Secondary | ICD-10-CM | POA: Diagnosis not present

## 2020-10-11 DIAGNOSIS — Z992 Dependence on renal dialysis: Secondary | ICD-10-CM | POA: Diagnosis not present

## 2020-10-12 DIAGNOSIS — N186 End stage renal disease: Secondary | ICD-10-CM | POA: Diagnosis not present

## 2020-10-12 DIAGNOSIS — Z992 Dependence on renal dialysis: Secondary | ICD-10-CM | POA: Diagnosis not present

## 2020-10-13 DIAGNOSIS — N186 End stage renal disease: Secondary | ICD-10-CM | POA: Diagnosis not present

## 2020-10-13 DIAGNOSIS — Z992 Dependence on renal dialysis: Secondary | ICD-10-CM | POA: Diagnosis not present

## 2020-10-24 DIAGNOSIS — N186 End stage renal disease: Secondary | ICD-10-CM | POA: Diagnosis not present

## 2020-10-24 DIAGNOSIS — Z992 Dependence on renal dialysis: Secondary | ICD-10-CM | POA: Diagnosis not present

## 2020-10-25 DIAGNOSIS — Z992 Dependence on renal dialysis: Secondary | ICD-10-CM | POA: Diagnosis not present

## 2020-10-25 DIAGNOSIS — N186 End stage renal disease: Secondary | ICD-10-CM | POA: Diagnosis not present

## 2020-10-26 DIAGNOSIS — N186 End stage renal disease: Secondary | ICD-10-CM | POA: Diagnosis not present

## 2020-10-26 DIAGNOSIS — Z992 Dependence on renal dialysis: Secondary | ICD-10-CM | POA: Diagnosis not present

## 2020-10-27 DIAGNOSIS — H401133 Primary open-angle glaucoma, bilateral, severe stage: Secondary | ICD-10-CM | POA: Diagnosis not present

## 2020-10-27 DIAGNOSIS — Z992 Dependence on renal dialysis: Secondary | ICD-10-CM | POA: Diagnosis not present

## 2020-10-27 DIAGNOSIS — N186 End stage renal disease: Secondary | ICD-10-CM | POA: Diagnosis not present

## 2020-10-28 DIAGNOSIS — Z992 Dependence on renal dialysis: Secondary | ICD-10-CM | POA: Diagnosis not present

## 2020-10-28 DIAGNOSIS — N186 End stage renal disease: Secondary | ICD-10-CM | POA: Diagnosis not present

## 2020-10-29 DIAGNOSIS — N186 End stage renal disease: Secondary | ICD-10-CM | POA: Diagnosis not present

## 2020-10-29 DIAGNOSIS — Z992 Dependence on renal dialysis: Secondary | ICD-10-CM | POA: Diagnosis not present

## 2020-10-30 DIAGNOSIS — Z992 Dependence on renal dialysis: Secondary | ICD-10-CM | POA: Diagnosis not present

## 2020-10-30 DIAGNOSIS — N186 End stage renal disease: Secondary | ICD-10-CM | POA: Diagnosis not present

## 2020-10-31 DIAGNOSIS — N186 End stage renal disease: Secondary | ICD-10-CM | POA: Diagnosis not present

## 2020-10-31 DIAGNOSIS — Z992 Dependence on renal dialysis: Secondary | ICD-10-CM | POA: Diagnosis not present

## 2020-11-01 DIAGNOSIS — Z992 Dependence on renal dialysis: Secondary | ICD-10-CM | POA: Diagnosis not present

## 2020-11-01 DIAGNOSIS — N186 End stage renal disease: Secondary | ICD-10-CM | POA: Diagnosis not present

## 2020-11-02 DIAGNOSIS — N186 End stage renal disease: Secondary | ICD-10-CM | POA: Diagnosis not present

## 2020-11-02 DIAGNOSIS — Z992 Dependence on renal dialysis: Secondary | ICD-10-CM | POA: Diagnosis not present

## 2020-11-03 DIAGNOSIS — N186 End stage renal disease: Secondary | ICD-10-CM | POA: Diagnosis not present

## 2020-11-03 DIAGNOSIS — Z992 Dependence on renal dialysis: Secondary | ICD-10-CM | POA: Diagnosis not present

## 2020-11-04 DIAGNOSIS — N186 End stage renal disease: Secondary | ICD-10-CM | POA: Diagnosis not present

## 2020-11-04 DIAGNOSIS — Z992 Dependence on renal dialysis: Secondary | ICD-10-CM | POA: Diagnosis not present

## 2020-11-05 DIAGNOSIS — Z992 Dependence on renal dialysis: Secondary | ICD-10-CM | POA: Diagnosis not present

## 2020-11-05 DIAGNOSIS — N186 End stage renal disease: Secondary | ICD-10-CM | POA: Diagnosis not present

## 2020-11-06 DIAGNOSIS — N186 End stage renal disease: Secondary | ICD-10-CM | POA: Diagnosis not present

## 2020-11-06 DIAGNOSIS — Z992 Dependence on renal dialysis: Secondary | ICD-10-CM | POA: Diagnosis not present

## 2020-11-07 DIAGNOSIS — Z992 Dependence on renal dialysis: Secondary | ICD-10-CM | POA: Diagnosis not present

## 2020-11-07 DIAGNOSIS — N186 End stage renal disease: Secondary | ICD-10-CM | POA: Diagnosis not present

## 2020-11-08 DIAGNOSIS — Z992 Dependence on renal dialysis: Secondary | ICD-10-CM | POA: Diagnosis not present

## 2020-11-08 DIAGNOSIS — N186 End stage renal disease: Secondary | ICD-10-CM | POA: Diagnosis not present

## 2020-11-09 DIAGNOSIS — N186 End stage renal disease: Secondary | ICD-10-CM | POA: Diagnosis not present

## 2020-11-09 DIAGNOSIS — Z992 Dependence on renal dialysis: Secondary | ICD-10-CM | POA: Diagnosis not present

## 2020-11-10 DIAGNOSIS — N186 End stage renal disease: Secondary | ICD-10-CM | POA: Diagnosis not present

## 2020-11-10 DIAGNOSIS — Z992 Dependence on renal dialysis: Secondary | ICD-10-CM | POA: Diagnosis not present

## 2020-11-11 DIAGNOSIS — Z992 Dependence on renal dialysis: Secondary | ICD-10-CM | POA: Diagnosis not present

## 2020-11-11 DIAGNOSIS — N186 End stage renal disease: Secondary | ICD-10-CM | POA: Diagnosis not present

## 2020-11-12 DIAGNOSIS — N186 End stage renal disease: Secondary | ICD-10-CM | POA: Diagnosis not present

## 2020-11-12 DIAGNOSIS — Z992 Dependence on renal dialysis: Secondary | ICD-10-CM | POA: Diagnosis not present

## 2020-11-13 DIAGNOSIS — Z992 Dependence on renal dialysis: Secondary | ICD-10-CM | POA: Diagnosis not present

## 2020-11-13 DIAGNOSIS — N186 End stage renal disease: Secondary | ICD-10-CM | POA: Diagnosis not present

## 2020-11-14 DIAGNOSIS — N186 End stage renal disease: Secondary | ICD-10-CM | POA: Diagnosis not present

## 2020-11-14 DIAGNOSIS — Z992 Dependence on renal dialysis: Secondary | ICD-10-CM | POA: Diagnosis not present

## 2020-11-15 DIAGNOSIS — N186 End stage renal disease: Secondary | ICD-10-CM | POA: Diagnosis not present

## 2020-11-15 DIAGNOSIS — Z992 Dependence on renal dialysis: Secondary | ICD-10-CM | POA: Diagnosis not present

## 2020-11-16 DIAGNOSIS — N186 End stage renal disease: Secondary | ICD-10-CM | POA: Diagnosis not present

## 2020-11-16 DIAGNOSIS — Z992 Dependence on renal dialysis: Secondary | ICD-10-CM | POA: Diagnosis not present

## 2020-11-17 DIAGNOSIS — Z992 Dependence on renal dialysis: Secondary | ICD-10-CM | POA: Diagnosis not present

## 2020-11-17 DIAGNOSIS — N186 End stage renal disease: Secondary | ICD-10-CM | POA: Diagnosis not present

## 2020-11-18 DIAGNOSIS — Z992 Dependence on renal dialysis: Secondary | ICD-10-CM | POA: Diagnosis not present

## 2020-11-18 DIAGNOSIS — N186 End stage renal disease: Secondary | ICD-10-CM | POA: Diagnosis not present

## 2020-11-19 DIAGNOSIS — N186 End stage renal disease: Secondary | ICD-10-CM | POA: Diagnosis not present

## 2020-11-19 DIAGNOSIS — Z992 Dependence on renal dialysis: Secondary | ICD-10-CM | POA: Diagnosis not present

## 2020-11-20 DIAGNOSIS — N186 End stage renal disease: Secondary | ICD-10-CM | POA: Diagnosis not present

## 2020-11-20 DIAGNOSIS — Z992 Dependence on renal dialysis: Secondary | ICD-10-CM | POA: Diagnosis not present

## 2020-11-21 DIAGNOSIS — N186 End stage renal disease: Secondary | ICD-10-CM | POA: Diagnosis not present

## 2020-11-21 DIAGNOSIS — Z992 Dependence on renal dialysis: Secondary | ICD-10-CM | POA: Diagnosis not present

## 2020-11-22 DIAGNOSIS — N186 End stage renal disease: Secondary | ICD-10-CM | POA: Diagnosis not present

## 2020-11-22 DIAGNOSIS — Z992 Dependence on renal dialysis: Secondary | ICD-10-CM | POA: Diagnosis not present

## 2020-11-23 DIAGNOSIS — N186 End stage renal disease: Secondary | ICD-10-CM | POA: Diagnosis not present

## 2020-11-23 DIAGNOSIS — Z992 Dependence on renal dialysis: Secondary | ICD-10-CM | POA: Diagnosis not present

## 2020-11-24 DIAGNOSIS — N186 End stage renal disease: Secondary | ICD-10-CM | POA: Diagnosis not present

## 2020-11-24 DIAGNOSIS — Z992 Dependence on renal dialysis: Secondary | ICD-10-CM | POA: Diagnosis not present

## 2020-11-25 DIAGNOSIS — N186 End stage renal disease: Secondary | ICD-10-CM | POA: Diagnosis not present

## 2020-11-25 DIAGNOSIS — Z992 Dependence on renal dialysis: Secondary | ICD-10-CM | POA: Diagnosis not present

## 2020-11-26 DIAGNOSIS — N186 End stage renal disease: Secondary | ICD-10-CM | POA: Diagnosis not present

## 2020-11-26 DIAGNOSIS — Z992 Dependence on renal dialysis: Secondary | ICD-10-CM | POA: Diagnosis not present

## 2020-11-27 DIAGNOSIS — N186 End stage renal disease: Secondary | ICD-10-CM | POA: Diagnosis not present

## 2020-11-27 DIAGNOSIS — Z992 Dependence on renal dialysis: Secondary | ICD-10-CM | POA: Diagnosis not present

## 2020-11-28 DIAGNOSIS — Z992 Dependence on renal dialysis: Secondary | ICD-10-CM | POA: Diagnosis not present

## 2020-11-28 DIAGNOSIS — N186 End stage renal disease: Secondary | ICD-10-CM | POA: Diagnosis not present

## 2020-11-29 DIAGNOSIS — Z992 Dependence on renal dialysis: Secondary | ICD-10-CM | POA: Diagnosis not present

## 2020-11-29 DIAGNOSIS — N186 End stage renal disease: Secondary | ICD-10-CM | POA: Diagnosis not present

## 2020-11-30 DIAGNOSIS — N186 End stage renal disease: Secondary | ICD-10-CM | POA: Diagnosis not present

## 2020-11-30 DIAGNOSIS — Z992 Dependence on renal dialysis: Secondary | ICD-10-CM | POA: Diagnosis not present

## 2020-12-01 DIAGNOSIS — Z992 Dependence on renal dialysis: Secondary | ICD-10-CM | POA: Diagnosis not present

## 2020-12-01 DIAGNOSIS — N186 End stage renal disease: Secondary | ICD-10-CM | POA: Diagnosis not present

## 2020-12-02 DIAGNOSIS — N186 End stage renal disease: Secondary | ICD-10-CM | POA: Diagnosis not present

## 2020-12-02 DIAGNOSIS — Z992 Dependence on renal dialysis: Secondary | ICD-10-CM | POA: Diagnosis not present

## 2020-12-03 DIAGNOSIS — N186 End stage renal disease: Secondary | ICD-10-CM | POA: Diagnosis not present

## 2020-12-03 DIAGNOSIS — Z992 Dependence on renal dialysis: Secondary | ICD-10-CM | POA: Diagnosis not present

## 2020-12-04 DIAGNOSIS — Z992 Dependence on renal dialysis: Secondary | ICD-10-CM | POA: Diagnosis not present

## 2020-12-04 DIAGNOSIS — N186 End stage renal disease: Secondary | ICD-10-CM | POA: Diagnosis not present

## 2020-12-05 DIAGNOSIS — N186 End stage renal disease: Secondary | ICD-10-CM | POA: Diagnosis not present

## 2020-12-05 DIAGNOSIS — Z992 Dependence on renal dialysis: Secondary | ICD-10-CM | POA: Diagnosis not present

## 2020-12-06 DIAGNOSIS — N186 End stage renal disease: Secondary | ICD-10-CM | POA: Diagnosis not present

## 2020-12-06 DIAGNOSIS — Z992 Dependence on renal dialysis: Secondary | ICD-10-CM | POA: Diagnosis not present

## 2020-12-07 DIAGNOSIS — N186 End stage renal disease: Secondary | ICD-10-CM | POA: Diagnosis not present

## 2020-12-07 DIAGNOSIS — E785 Hyperlipidemia, unspecified: Secondary | ICD-10-CM | POA: Diagnosis not present

## 2020-12-07 DIAGNOSIS — Z992 Dependence on renal dialysis: Secondary | ICD-10-CM | POA: Diagnosis not present

## 2020-12-07 DIAGNOSIS — Z5181 Encounter for therapeutic drug level monitoring: Secondary | ICD-10-CM | POA: Diagnosis not present

## 2020-12-07 DIAGNOSIS — Z79899 Other long term (current) drug therapy: Secondary | ICD-10-CM | POA: Diagnosis not present

## 2020-12-07 DIAGNOSIS — Z131 Encounter for screening for diabetes mellitus: Secondary | ICD-10-CM | POA: Diagnosis not present

## 2020-12-08 DIAGNOSIS — N186 End stage renal disease: Secondary | ICD-10-CM | POA: Diagnosis not present

## 2020-12-08 DIAGNOSIS — Z992 Dependence on renal dialysis: Secondary | ICD-10-CM | POA: Diagnosis not present

## 2020-12-09 DIAGNOSIS — Z992 Dependence on renal dialysis: Secondary | ICD-10-CM | POA: Diagnosis not present

## 2020-12-09 DIAGNOSIS — N186 End stage renal disease: Secondary | ICD-10-CM | POA: Diagnosis not present

## 2020-12-10 DIAGNOSIS — N186 End stage renal disease: Secondary | ICD-10-CM | POA: Diagnosis not present

## 2020-12-10 DIAGNOSIS — Z992 Dependence on renal dialysis: Secondary | ICD-10-CM | POA: Diagnosis not present

## 2020-12-11 DIAGNOSIS — Z992 Dependence on renal dialysis: Secondary | ICD-10-CM | POA: Diagnosis not present

## 2020-12-11 DIAGNOSIS — N186 End stage renal disease: Secondary | ICD-10-CM | POA: Diagnosis not present

## 2020-12-12 DIAGNOSIS — Z992 Dependence on renal dialysis: Secondary | ICD-10-CM | POA: Diagnosis not present

## 2020-12-12 DIAGNOSIS — N186 End stage renal disease: Secondary | ICD-10-CM | POA: Diagnosis not present

## 2020-12-13 DIAGNOSIS — Z992 Dependence on renal dialysis: Secondary | ICD-10-CM | POA: Diagnosis not present

## 2020-12-13 DIAGNOSIS — N186 End stage renal disease: Secondary | ICD-10-CM | POA: Diagnosis not present

## 2020-12-14 DIAGNOSIS — N186 End stage renal disease: Secondary | ICD-10-CM | POA: Diagnosis not present

## 2020-12-14 DIAGNOSIS — Z992 Dependence on renal dialysis: Secondary | ICD-10-CM | POA: Diagnosis not present

## 2020-12-15 DIAGNOSIS — N186 End stage renal disease: Secondary | ICD-10-CM | POA: Diagnosis not present

## 2020-12-15 DIAGNOSIS — Z992 Dependence on renal dialysis: Secondary | ICD-10-CM | POA: Diagnosis not present

## 2020-12-16 DIAGNOSIS — N186 End stage renal disease: Secondary | ICD-10-CM | POA: Diagnosis not present

## 2020-12-16 DIAGNOSIS — Z992 Dependence on renal dialysis: Secondary | ICD-10-CM | POA: Diagnosis not present

## 2020-12-17 DIAGNOSIS — Z992 Dependence on renal dialysis: Secondary | ICD-10-CM | POA: Diagnosis not present

## 2020-12-17 DIAGNOSIS — N186 End stage renal disease: Secondary | ICD-10-CM | POA: Diagnosis not present

## 2020-12-18 DIAGNOSIS — Z992 Dependence on renal dialysis: Secondary | ICD-10-CM | POA: Diagnosis not present

## 2020-12-18 DIAGNOSIS — N186 End stage renal disease: Secondary | ICD-10-CM | POA: Diagnosis not present

## 2020-12-19 DIAGNOSIS — N186 End stage renal disease: Secondary | ICD-10-CM | POA: Diagnosis not present

## 2020-12-19 DIAGNOSIS — Z992 Dependence on renal dialysis: Secondary | ICD-10-CM | POA: Diagnosis not present

## 2020-12-20 DIAGNOSIS — N186 End stage renal disease: Secondary | ICD-10-CM | POA: Diagnosis not present

## 2020-12-20 DIAGNOSIS — Z992 Dependence on renal dialysis: Secondary | ICD-10-CM | POA: Diagnosis not present

## 2020-12-21 DIAGNOSIS — N186 End stage renal disease: Secondary | ICD-10-CM | POA: Diagnosis not present

## 2020-12-21 DIAGNOSIS — Z992 Dependence on renal dialysis: Secondary | ICD-10-CM | POA: Diagnosis not present

## 2020-12-22 DIAGNOSIS — N186 End stage renal disease: Secondary | ICD-10-CM | POA: Diagnosis not present

## 2020-12-22 DIAGNOSIS — Z992 Dependence on renal dialysis: Secondary | ICD-10-CM | POA: Diagnosis not present

## 2020-12-23 DIAGNOSIS — Z992 Dependence on renal dialysis: Secondary | ICD-10-CM | POA: Diagnosis not present

## 2020-12-23 DIAGNOSIS — N186 End stage renal disease: Secondary | ICD-10-CM | POA: Diagnosis not present

## 2020-12-24 DIAGNOSIS — Z992 Dependence on renal dialysis: Secondary | ICD-10-CM | POA: Diagnosis not present

## 2020-12-24 DIAGNOSIS — N186 End stage renal disease: Secondary | ICD-10-CM | POA: Diagnosis not present

## 2020-12-25 DIAGNOSIS — Z992 Dependence on renal dialysis: Secondary | ICD-10-CM | POA: Diagnosis not present

## 2020-12-25 DIAGNOSIS — N186 End stage renal disease: Secondary | ICD-10-CM | POA: Diagnosis not present

## 2020-12-26 DIAGNOSIS — N186 End stage renal disease: Secondary | ICD-10-CM | POA: Diagnosis not present

## 2020-12-26 DIAGNOSIS — Z992 Dependence on renal dialysis: Secondary | ICD-10-CM | POA: Diagnosis not present

## 2020-12-27 DIAGNOSIS — Z992 Dependence on renal dialysis: Secondary | ICD-10-CM | POA: Diagnosis not present

## 2020-12-27 DIAGNOSIS — N186 End stage renal disease: Secondary | ICD-10-CM | POA: Diagnosis not present

## 2020-12-28 DIAGNOSIS — N186 End stage renal disease: Secondary | ICD-10-CM | POA: Diagnosis not present

## 2020-12-28 DIAGNOSIS — Z992 Dependence on renal dialysis: Secondary | ICD-10-CM | POA: Diagnosis not present

## 2020-12-29 DIAGNOSIS — Z992 Dependence on renal dialysis: Secondary | ICD-10-CM | POA: Diagnosis not present

## 2020-12-29 DIAGNOSIS — N186 End stage renal disease: Secondary | ICD-10-CM | POA: Diagnosis not present

## 2020-12-30 DIAGNOSIS — N186 End stage renal disease: Secondary | ICD-10-CM | POA: Diagnosis not present

## 2020-12-30 DIAGNOSIS — Z992 Dependence on renal dialysis: Secondary | ICD-10-CM | POA: Diagnosis not present

## 2020-12-31 DIAGNOSIS — N186 End stage renal disease: Secondary | ICD-10-CM | POA: Diagnosis not present

## 2020-12-31 DIAGNOSIS — Z992 Dependence on renal dialysis: Secondary | ICD-10-CM | POA: Diagnosis not present

## 2021-01-01 DIAGNOSIS — Z992 Dependence on renal dialysis: Secondary | ICD-10-CM | POA: Diagnosis not present

## 2021-01-01 DIAGNOSIS — N186 End stage renal disease: Secondary | ICD-10-CM | POA: Diagnosis not present

## 2021-01-02 DIAGNOSIS — Z992 Dependence on renal dialysis: Secondary | ICD-10-CM | POA: Diagnosis not present

## 2021-01-02 DIAGNOSIS — N186 End stage renal disease: Secondary | ICD-10-CM | POA: Diagnosis not present

## 2021-01-03 DIAGNOSIS — Z992 Dependence on renal dialysis: Secondary | ICD-10-CM | POA: Diagnosis not present

## 2021-01-03 DIAGNOSIS — N186 End stage renal disease: Secondary | ICD-10-CM | POA: Diagnosis not present

## 2021-01-04 DIAGNOSIS — Z992 Dependence on renal dialysis: Secondary | ICD-10-CM | POA: Diagnosis not present

## 2021-01-04 DIAGNOSIS — N186 End stage renal disease: Secondary | ICD-10-CM | POA: Diagnosis not present

## 2021-01-05 DIAGNOSIS — Z992 Dependence on renal dialysis: Secondary | ICD-10-CM | POA: Diagnosis not present

## 2021-01-05 DIAGNOSIS — N186 End stage renal disease: Secondary | ICD-10-CM | POA: Diagnosis not present

## 2021-01-06 DIAGNOSIS — Z992 Dependence on renal dialysis: Secondary | ICD-10-CM | POA: Diagnosis not present

## 2021-01-06 DIAGNOSIS — N186 End stage renal disease: Secondary | ICD-10-CM | POA: Diagnosis not present

## 2021-01-07 DIAGNOSIS — Z992 Dependence on renal dialysis: Secondary | ICD-10-CM | POA: Diagnosis not present

## 2021-01-07 DIAGNOSIS — N186 End stage renal disease: Secondary | ICD-10-CM | POA: Diagnosis not present

## 2021-01-08 DIAGNOSIS — N186 End stage renal disease: Secondary | ICD-10-CM | POA: Diagnosis not present

## 2021-01-08 DIAGNOSIS — Z992 Dependence on renal dialysis: Secondary | ICD-10-CM | POA: Diagnosis not present

## 2021-01-09 DIAGNOSIS — Z992 Dependence on renal dialysis: Secondary | ICD-10-CM | POA: Diagnosis not present

## 2021-01-09 DIAGNOSIS — N186 End stage renal disease: Secondary | ICD-10-CM | POA: Diagnosis not present

## 2021-01-10 DIAGNOSIS — N186 End stage renal disease: Secondary | ICD-10-CM | POA: Diagnosis not present

## 2021-01-10 DIAGNOSIS — Z992 Dependence on renal dialysis: Secondary | ICD-10-CM | POA: Diagnosis not present

## 2021-01-11 DIAGNOSIS — N186 End stage renal disease: Secondary | ICD-10-CM | POA: Diagnosis not present

## 2021-01-11 DIAGNOSIS — Z992 Dependence on renal dialysis: Secondary | ICD-10-CM | POA: Diagnosis not present

## 2021-01-12 DIAGNOSIS — N186 End stage renal disease: Secondary | ICD-10-CM | POA: Diagnosis not present

## 2021-01-12 DIAGNOSIS — Z992 Dependence on renal dialysis: Secondary | ICD-10-CM | POA: Diagnosis not present

## 2021-01-13 DIAGNOSIS — N186 End stage renal disease: Secondary | ICD-10-CM | POA: Diagnosis not present

## 2021-01-13 DIAGNOSIS — Z992 Dependence on renal dialysis: Secondary | ICD-10-CM | POA: Diagnosis not present

## 2021-01-14 DIAGNOSIS — N186 End stage renal disease: Secondary | ICD-10-CM | POA: Diagnosis not present

## 2021-01-14 DIAGNOSIS — Z992 Dependence on renal dialysis: Secondary | ICD-10-CM | POA: Diagnosis not present

## 2021-01-15 DIAGNOSIS — Z992 Dependence on renal dialysis: Secondary | ICD-10-CM | POA: Diagnosis not present

## 2021-01-15 DIAGNOSIS — N186 End stage renal disease: Secondary | ICD-10-CM | POA: Diagnosis not present

## 2021-01-16 DIAGNOSIS — N186 End stage renal disease: Secondary | ICD-10-CM | POA: Diagnosis not present

## 2021-01-16 DIAGNOSIS — Z992 Dependence on renal dialysis: Secondary | ICD-10-CM | POA: Diagnosis not present

## 2021-01-17 DIAGNOSIS — Z992 Dependence on renal dialysis: Secondary | ICD-10-CM | POA: Diagnosis not present

## 2021-01-17 DIAGNOSIS — N186 End stage renal disease: Secondary | ICD-10-CM | POA: Diagnosis not present

## 2021-01-18 DIAGNOSIS — Z992 Dependence on renal dialysis: Secondary | ICD-10-CM | POA: Diagnosis not present

## 2021-01-18 DIAGNOSIS — N186 End stage renal disease: Secondary | ICD-10-CM | POA: Diagnosis not present

## 2021-01-19 DIAGNOSIS — Z992 Dependence on renal dialysis: Secondary | ICD-10-CM | POA: Diagnosis not present

## 2021-01-19 DIAGNOSIS — N186 End stage renal disease: Secondary | ICD-10-CM | POA: Diagnosis not present

## 2021-01-20 DIAGNOSIS — Z992 Dependence on renal dialysis: Secondary | ICD-10-CM | POA: Diagnosis not present

## 2021-01-20 DIAGNOSIS — N186 End stage renal disease: Secondary | ICD-10-CM | POA: Diagnosis not present

## 2021-01-21 DIAGNOSIS — Z992 Dependence on renal dialysis: Secondary | ICD-10-CM | POA: Diagnosis not present

## 2021-01-21 DIAGNOSIS — N186 End stage renal disease: Secondary | ICD-10-CM | POA: Diagnosis not present

## 2021-01-22 DIAGNOSIS — Z992 Dependence on renal dialysis: Secondary | ICD-10-CM | POA: Diagnosis not present

## 2021-01-22 DIAGNOSIS — N186 End stage renal disease: Secondary | ICD-10-CM | POA: Diagnosis not present

## 2021-01-23 DIAGNOSIS — N186 End stage renal disease: Secondary | ICD-10-CM | POA: Diagnosis not present

## 2021-01-23 DIAGNOSIS — Z992 Dependence on renal dialysis: Secondary | ICD-10-CM | POA: Diagnosis not present

## 2021-01-24 DIAGNOSIS — N186 End stage renal disease: Secondary | ICD-10-CM | POA: Diagnosis not present

## 2021-01-24 DIAGNOSIS — Z992 Dependence on renal dialysis: Secondary | ICD-10-CM | POA: Diagnosis not present

## 2021-01-25 DIAGNOSIS — N186 End stage renal disease: Secondary | ICD-10-CM | POA: Diagnosis not present

## 2021-01-25 DIAGNOSIS — Z992 Dependence on renal dialysis: Secondary | ICD-10-CM | POA: Diagnosis not present

## 2021-01-26 DIAGNOSIS — N186 End stage renal disease: Secondary | ICD-10-CM | POA: Diagnosis not present

## 2021-01-26 DIAGNOSIS — Z992 Dependence on renal dialysis: Secondary | ICD-10-CM | POA: Diagnosis not present

## 2021-01-27 DIAGNOSIS — Z992 Dependence on renal dialysis: Secondary | ICD-10-CM | POA: Diagnosis not present

## 2021-01-27 DIAGNOSIS — N186 End stage renal disease: Secondary | ICD-10-CM | POA: Diagnosis not present

## 2021-01-28 DIAGNOSIS — Z992 Dependence on renal dialysis: Secondary | ICD-10-CM | POA: Diagnosis not present

## 2021-01-28 DIAGNOSIS — N186 End stage renal disease: Secondary | ICD-10-CM | POA: Diagnosis not present

## 2021-01-29 DIAGNOSIS — Z992 Dependence on renal dialysis: Secondary | ICD-10-CM | POA: Diagnosis not present

## 2021-01-29 DIAGNOSIS — N186 End stage renal disease: Secondary | ICD-10-CM | POA: Diagnosis not present

## 2021-01-30 DIAGNOSIS — Z992 Dependence on renal dialysis: Secondary | ICD-10-CM | POA: Diagnosis not present

## 2021-01-30 DIAGNOSIS — N186 End stage renal disease: Secondary | ICD-10-CM | POA: Diagnosis not present

## 2021-01-31 DIAGNOSIS — Z992 Dependence on renal dialysis: Secondary | ICD-10-CM | POA: Diagnosis not present

## 2021-01-31 DIAGNOSIS — N186 End stage renal disease: Secondary | ICD-10-CM | POA: Diagnosis not present

## 2021-02-01 DIAGNOSIS — Z992 Dependence on renal dialysis: Secondary | ICD-10-CM | POA: Diagnosis not present

## 2021-02-01 DIAGNOSIS — N186 End stage renal disease: Secondary | ICD-10-CM | POA: Diagnosis not present

## 2021-02-02 DIAGNOSIS — Z992 Dependence on renal dialysis: Secondary | ICD-10-CM | POA: Diagnosis not present

## 2021-02-02 DIAGNOSIS — N186 End stage renal disease: Secondary | ICD-10-CM | POA: Diagnosis not present

## 2021-02-03 DIAGNOSIS — Z992 Dependence on renal dialysis: Secondary | ICD-10-CM | POA: Diagnosis not present

## 2021-02-03 DIAGNOSIS — N186 End stage renal disease: Secondary | ICD-10-CM | POA: Diagnosis not present

## 2021-02-04 DIAGNOSIS — Z992 Dependence on renal dialysis: Secondary | ICD-10-CM | POA: Diagnosis not present

## 2021-02-04 DIAGNOSIS — N186 End stage renal disease: Secondary | ICD-10-CM | POA: Diagnosis not present

## 2021-02-05 DIAGNOSIS — N186 End stage renal disease: Secondary | ICD-10-CM | POA: Diagnosis not present

## 2021-02-05 DIAGNOSIS — Z992 Dependence on renal dialysis: Secondary | ICD-10-CM | POA: Diagnosis not present

## 2021-02-06 DIAGNOSIS — N186 End stage renal disease: Secondary | ICD-10-CM | POA: Diagnosis not present

## 2021-02-06 DIAGNOSIS — Z992 Dependence on renal dialysis: Secondary | ICD-10-CM | POA: Diagnosis not present

## 2021-02-07 DIAGNOSIS — Z992 Dependence on renal dialysis: Secondary | ICD-10-CM | POA: Diagnosis not present

## 2021-02-07 DIAGNOSIS — N186 End stage renal disease: Secondary | ICD-10-CM | POA: Diagnosis not present

## 2021-02-08 DIAGNOSIS — Z992 Dependence on renal dialysis: Secondary | ICD-10-CM | POA: Diagnosis not present

## 2021-02-08 DIAGNOSIS — N186 End stage renal disease: Secondary | ICD-10-CM | POA: Diagnosis not present

## 2021-02-09 DIAGNOSIS — N186 End stage renal disease: Secondary | ICD-10-CM | POA: Diagnosis not present

## 2021-02-09 DIAGNOSIS — Z992 Dependence on renal dialysis: Secondary | ICD-10-CM | POA: Diagnosis not present

## 2021-02-10 DIAGNOSIS — Z992 Dependence on renal dialysis: Secondary | ICD-10-CM | POA: Diagnosis not present

## 2021-02-10 DIAGNOSIS — N186 End stage renal disease: Secondary | ICD-10-CM | POA: Diagnosis not present

## 2021-02-11 DIAGNOSIS — Z992 Dependence on renal dialysis: Secondary | ICD-10-CM | POA: Diagnosis not present

## 2021-02-11 DIAGNOSIS — N186 End stage renal disease: Secondary | ICD-10-CM | POA: Diagnosis not present

## 2021-02-12 DIAGNOSIS — Z992 Dependence on renal dialysis: Secondary | ICD-10-CM | POA: Diagnosis not present

## 2021-02-12 DIAGNOSIS — N186 End stage renal disease: Secondary | ICD-10-CM | POA: Diagnosis not present

## 2021-02-13 DIAGNOSIS — Z992 Dependence on renal dialysis: Secondary | ICD-10-CM | POA: Diagnosis not present

## 2021-02-13 DIAGNOSIS — N186 End stage renal disease: Secondary | ICD-10-CM | POA: Diagnosis not present

## 2021-02-14 DIAGNOSIS — Z992 Dependence on renal dialysis: Secondary | ICD-10-CM | POA: Diagnosis not present

## 2021-02-14 DIAGNOSIS — N186 End stage renal disease: Secondary | ICD-10-CM | POA: Diagnosis not present

## 2021-02-15 DIAGNOSIS — N186 End stage renal disease: Secondary | ICD-10-CM | POA: Diagnosis not present

## 2021-02-15 DIAGNOSIS — Z992 Dependence on renal dialysis: Secondary | ICD-10-CM | POA: Diagnosis not present

## 2021-02-16 DIAGNOSIS — N186 End stage renal disease: Secondary | ICD-10-CM | POA: Diagnosis not present

## 2021-02-16 DIAGNOSIS — Z992 Dependence on renal dialysis: Secondary | ICD-10-CM | POA: Diagnosis not present

## 2021-02-17 DIAGNOSIS — Z992 Dependence on renal dialysis: Secondary | ICD-10-CM | POA: Diagnosis not present

## 2021-02-17 DIAGNOSIS — N186 End stage renal disease: Secondary | ICD-10-CM | POA: Diagnosis not present

## 2021-02-18 DIAGNOSIS — N186 End stage renal disease: Secondary | ICD-10-CM | POA: Diagnosis not present

## 2021-02-18 DIAGNOSIS — Z992 Dependence on renal dialysis: Secondary | ICD-10-CM | POA: Diagnosis not present

## 2021-02-19 DIAGNOSIS — Z992 Dependence on renal dialysis: Secondary | ICD-10-CM | POA: Diagnosis not present

## 2021-02-19 DIAGNOSIS — N186 End stage renal disease: Secondary | ICD-10-CM | POA: Diagnosis not present

## 2021-02-20 DIAGNOSIS — N186 End stage renal disease: Secondary | ICD-10-CM | POA: Diagnosis not present

## 2021-02-20 DIAGNOSIS — Z992 Dependence on renal dialysis: Secondary | ICD-10-CM | POA: Diagnosis not present

## 2021-02-21 DIAGNOSIS — N186 End stage renal disease: Secondary | ICD-10-CM | POA: Diagnosis not present

## 2021-02-21 DIAGNOSIS — Z992 Dependence on renal dialysis: Secondary | ICD-10-CM | POA: Diagnosis not present

## 2021-02-22 DIAGNOSIS — N186 End stage renal disease: Secondary | ICD-10-CM | POA: Diagnosis not present

## 2021-02-22 DIAGNOSIS — Z992 Dependence on renal dialysis: Secondary | ICD-10-CM | POA: Diagnosis not present

## 2021-02-23 DIAGNOSIS — Z992 Dependence on renal dialysis: Secondary | ICD-10-CM | POA: Diagnosis not present

## 2021-02-23 DIAGNOSIS — N186 End stage renal disease: Secondary | ICD-10-CM | POA: Diagnosis not present

## 2021-02-24 DIAGNOSIS — Z992 Dependence on renal dialysis: Secondary | ICD-10-CM | POA: Diagnosis not present

## 2021-02-24 DIAGNOSIS — N186 End stage renal disease: Secondary | ICD-10-CM | POA: Diagnosis not present

## 2021-02-25 DIAGNOSIS — N186 End stage renal disease: Secondary | ICD-10-CM | POA: Diagnosis not present

## 2021-02-25 DIAGNOSIS — Z992 Dependence on renal dialysis: Secondary | ICD-10-CM | POA: Diagnosis not present

## 2021-02-26 DIAGNOSIS — Z992 Dependence on renal dialysis: Secondary | ICD-10-CM | POA: Diagnosis not present

## 2021-02-26 DIAGNOSIS — N186 End stage renal disease: Secondary | ICD-10-CM | POA: Diagnosis not present

## 2021-02-27 DIAGNOSIS — Z992 Dependence on renal dialysis: Secondary | ICD-10-CM | POA: Diagnosis not present

## 2021-02-27 DIAGNOSIS — N186 End stage renal disease: Secondary | ICD-10-CM | POA: Diagnosis not present

## 2021-02-28 DIAGNOSIS — Z992 Dependence on renal dialysis: Secondary | ICD-10-CM | POA: Diagnosis not present

## 2021-02-28 DIAGNOSIS — N186 End stage renal disease: Secondary | ICD-10-CM | POA: Diagnosis not present

## 2021-03-01 DIAGNOSIS — N186 End stage renal disease: Secondary | ICD-10-CM | POA: Diagnosis not present

## 2021-03-01 DIAGNOSIS — Z992 Dependence on renal dialysis: Secondary | ICD-10-CM | POA: Diagnosis not present

## 2021-03-02 DIAGNOSIS — N186 End stage renal disease: Secondary | ICD-10-CM | POA: Diagnosis not present

## 2021-03-02 DIAGNOSIS — Z992 Dependence on renal dialysis: Secondary | ICD-10-CM | POA: Diagnosis not present

## 2021-03-03 DIAGNOSIS — Z992 Dependence on renal dialysis: Secondary | ICD-10-CM | POA: Diagnosis not present

## 2021-03-03 DIAGNOSIS — N186 End stage renal disease: Secondary | ICD-10-CM | POA: Diagnosis not present

## 2021-03-04 DIAGNOSIS — Z992 Dependence on renal dialysis: Secondary | ICD-10-CM | POA: Diagnosis not present

## 2021-03-04 DIAGNOSIS — N186 End stage renal disease: Secondary | ICD-10-CM | POA: Diagnosis not present

## 2021-03-05 DIAGNOSIS — N186 End stage renal disease: Secondary | ICD-10-CM | POA: Diagnosis not present

## 2021-03-05 DIAGNOSIS — Z131 Encounter for screening for diabetes mellitus: Secondary | ICD-10-CM | POA: Diagnosis not present

## 2021-03-05 DIAGNOSIS — Z992 Dependence on renal dialysis: Secondary | ICD-10-CM | POA: Diagnosis not present

## 2021-03-06 DIAGNOSIS — Z992 Dependence on renal dialysis: Secondary | ICD-10-CM | POA: Diagnosis not present

## 2021-03-06 DIAGNOSIS — N186 End stage renal disease: Secondary | ICD-10-CM | POA: Diagnosis not present

## 2021-03-07 DIAGNOSIS — N186 End stage renal disease: Secondary | ICD-10-CM | POA: Diagnosis not present

## 2021-03-07 DIAGNOSIS — Z992 Dependence on renal dialysis: Secondary | ICD-10-CM | POA: Diagnosis not present

## 2021-03-08 DIAGNOSIS — N186 End stage renal disease: Secondary | ICD-10-CM | POA: Diagnosis not present

## 2021-03-08 DIAGNOSIS — Z992 Dependence on renal dialysis: Secondary | ICD-10-CM | POA: Diagnosis not present

## 2021-03-09 DIAGNOSIS — N186 End stage renal disease: Secondary | ICD-10-CM | POA: Diagnosis not present

## 2021-03-09 DIAGNOSIS — Z992 Dependence on renal dialysis: Secondary | ICD-10-CM | POA: Diagnosis not present

## 2021-03-10 DIAGNOSIS — Z992 Dependence on renal dialysis: Secondary | ICD-10-CM | POA: Diagnosis not present

## 2021-03-10 DIAGNOSIS — N186 End stage renal disease: Secondary | ICD-10-CM | POA: Diagnosis not present

## 2021-03-11 DIAGNOSIS — N186 End stage renal disease: Secondary | ICD-10-CM | POA: Diagnosis not present

## 2021-03-11 DIAGNOSIS — Z992 Dependence on renal dialysis: Secondary | ICD-10-CM | POA: Diagnosis not present

## 2021-03-12 DIAGNOSIS — N186 End stage renal disease: Secondary | ICD-10-CM | POA: Diagnosis not present

## 2021-03-12 DIAGNOSIS — Z992 Dependence on renal dialysis: Secondary | ICD-10-CM | POA: Diagnosis not present

## 2021-03-13 DIAGNOSIS — N186 End stage renal disease: Secondary | ICD-10-CM | POA: Diagnosis not present

## 2021-03-13 DIAGNOSIS — Z992 Dependence on renal dialysis: Secondary | ICD-10-CM | POA: Diagnosis not present

## 2021-03-14 DIAGNOSIS — N186 End stage renal disease: Secondary | ICD-10-CM | POA: Diagnosis not present

## 2021-03-14 DIAGNOSIS — Z992 Dependence on renal dialysis: Secondary | ICD-10-CM | POA: Diagnosis not present

## 2021-03-15 DIAGNOSIS — N186 End stage renal disease: Secondary | ICD-10-CM | POA: Diagnosis not present

## 2021-03-15 DIAGNOSIS — Z992 Dependence on renal dialysis: Secondary | ICD-10-CM | POA: Diagnosis not present

## 2021-03-16 DIAGNOSIS — Z992 Dependence on renal dialysis: Secondary | ICD-10-CM | POA: Diagnosis not present

## 2021-03-16 DIAGNOSIS — N186 End stage renal disease: Secondary | ICD-10-CM | POA: Diagnosis not present

## 2021-03-17 DIAGNOSIS — Z992 Dependence on renal dialysis: Secondary | ICD-10-CM | POA: Diagnosis not present

## 2021-03-17 DIAGNOSIS — N186 End stage renal disease: Secondary | ICD-10-CM | POA: Diagnosis not present

## 2021-03-18 DIAGNOSIS — Z992 Dependence on renal dialysis: Secondary | ICD-10-CM | POA: Diagnosis not present

## 2021-03-18 DIAGNOSIS — N186 End stage renal disease: Secondary | ICD-10-CM | POA: Diagnosis not present

## 2021-03-19 DIAGNOSIS — Z992 Dependence on renal dialysis: Secondary | ICD-10-CM | POA: Diagnosis not present

## 2021-03-19 DIAGNOSIS — N186 End stage renal disease: Secondary | ICD-10-CM | POA: Diagnosis not present

## 2021-03-20 DIAGNOSIS — Z992 Dependence on renal dialysis: Secondary | ICD-10-CM | POA: Diagnosis not present

## 2021-03-20 DIAGNOSIS — N186 End stage renal disease: Secondary | ICD-10-CM | POA: Diagnosis not present

## 2021-03-21 DIAGNOSIS — Z992 Dependence on renal dialysis: Secondary | ICD-10-CM | POA: Diagnosis not present

## 2021-03-21 DIAGNOSIS — N186 End stage renal disease: Secondary | ICD-10-CM | POA: Diagnosis not present

## 2021-03-22 DIAGNOSIS — N186 End stage renal disease: Secondary | ICD-10-CM | POA: Diagnosis not present

## 2021-03-22 DIAGNOSIS — Z992 Dependence on renal dialysis: Secondary | ICD-10-CM | POA: Diagnosis not present

## 2021-03-23 DIAGNOSIS — N186 End stage renal disease: Secondary | ICD-10-CM | POA: Diagnosis not present

## 2021-03-23 DIAGNOSIS — Z992 Dependence on renal dialysis: Secondary | ICD-10-CM | POA: Diagnosis not present

## 2021-03-24 DIAGNOSIS — N186 End stage renal disease: Secondary | ICD-10-CM | POA: Diagnosis not present

## 2021-03-24 DIAGNOSIS — Z992 Dependence on renal dialysis: Secondary | ICD-10-CM | POA: Diagnosis not present

## 2021-03-25 DIAGNOSIS — N186 End stage renal disease: Secondary | ICD-10-CM | POA: Diagnosis not present

## 2021-03-25 DIAGNOSIS — Z992 Dependence on renal dialysis: Secondary | ICD-10-CM | POA: Diagnosis not present

## 2021-03-26 DIAGNOSIS — N186 End stage renal disease: Secondary | ICD-10-CM | POA: Diagnosis not present

## 2021-03-26 DIAGNOSIS — Z992 Dependence on renal dialysis: Secondary | ICD-10-CM | POA: Diagnosis not present

## 2021-03-27 DIAGNOSIS — N186 End stage renal disease: Secondary | ICD-10-CM | POA: Diagnosis not present

## 2021-03-27 DIAGNOSIS — Z992 Dependence on renal dialysis: Secondary | ICD-10-CM | POA: Diagnosis not present

## 2021-03-28 DIAGNOSIS — Z992 Dependence on renal dialysis: Secondary | ICD-10-CM | POA: Diagnosis not present

## 2021-03-28 DIAGNOSIS — N186 End stage renal disease: Secondary | ICD-10-CM | POA: Diagnosis not present

## 2021-03-29 DIAGNOSIS — Z992 Dependence on renal dialysis: Secondary | ICD-10-CM | POA: Diagnosis not present

## 2021-03-29 DIAGNOSIS — N186 End stage renal disease: Secondary | ICD-10-CM | POA: Diagnosis not present

## 2021-03-30 DIAGNOSIS — N186 End stage renal disease: Secondary | ICD-10-CM | POA: Diagnosis not present

## 2021-03-30 DIAGNOSIS — Z992 Dependence on renal dialysis: Secondary | ICD-10-CM | POA: Diagnosis not present

## 2021-03-31 DIAGNOSIS — N186 End stage renal disease: Secondary | ICD-10-CM | POA: Diagnosis not present

## 2021-03-31 DIAGNOSIS — Z992 Dependence on renal dialysis: Secondary | ICD-10-CM | POA: Diagnosis not present

## 2021-04-01 DIAGNOSIS — Z992 Dependence on renal dialysis: Secondary | ICD-10-CM | POA: Diagnosis not present

## 2021-04-01 DIAGNOSIS — N186 End stage renal disease: Secondary | ICD-10-CM | POA: Diagnosis not present

## 2021-04-02 DIAGNOSIS — N186 End stage renal disease: Secondary | ICD-10-CM | POA: Diagnosis not present

## 2021-04-02 DIAGNOSIS — Z992 Dependence on renal dialysis: Secondary | ICD-10-CM | POA: Diagnosis not present

## 2021-04-03 DIAGNOSIS — Z992 Dependence on renal dialysis: Secondary | ICD-10-CM | POA: Diagnosis not present

## 2021-04-03 DIAGNOSIS — N186 End stage renal disease: Secondary | ICD-10-CM | POA: Diagnosis not present

## 2021-04-04 DIAGNOSIS — N186 End stage renal disease: Secondary | ICD-10-CM | POA: Diagnosis not present

## 2021-04-04 DIAGNOSIS — Z992 Dependence on renal dialysis: Secondary | ICD-10-CM | POA: Diagnosis not present

## 2021-04-05 DIAGNOSIS — N186 End stage renal disease: Secondary | ICD-10-CM | POA: Diagnosis not present

## 2021-04-05 DIAGNOSIS — Z992 Dependence on renal dialysis: Secondary | ICD-10-CM | POA: Diagnosis not present

## 2021-04-06 DIAGNOSIS — Z992 Dependence on renal dialysis: Secondary | ICD-10-CM | POA: Diagnosis not present

## 2021-04-06 DIAGNOSIS — N186 End stage renal disease: Secondary | ICD-10-CM | POA: Diagnosis not present

## 2021-04-07 DIAGNOSIS — Z992 Dependence on renal dialysis: Secondary | ICD-10-CM | POA: Diagnosis not present

## 2021-04-07 DIAGNOSIS — N186 End stage renal disease: Secondary | ICD-10-CM | POA: Diagnosis not present

## 2021-04-08 DIAGNOSIS — Z992 Dependence on renal dialysis: Secondary | ICD-10-CM | POA: Diagnosis not present

## 2021-04-08 DIAGNOSIS — N186 End stage renal disease: Secondary | ICD-10-CM | POA: Diagnosis not present

## 2021-04-09 DIAGNOSIS — N186 End stage renal disease: Secondary | ICD-10-CM | POA: Diagnosis not present

## 2021-04-09 DIAGNOSIS — Z992 Dependence on renal dialysis: Secondary | ICD-10-CM | POA: Diagnosis not present

## 2021-04-10 DIAGNOSIS — Z992 Dependence on renal dialysis: Secondary | ICD-10-CM | POA: Diagnosis not present

## 2021-04-10 DIAGNOSIS — N186 End stage renal disease: Secondary | ICD-10-CM | POA: Diagnosis not present

## 2021-04-11 DIAGNOSIS — N186 End stage renal disease: Secondary | ICD-10-CM | POA: Diagnosis not present

## 2021-04-11 DIAGNOSIS — Z992 Dependence on renal dialysis: Secondary | ICD-10-CM | POA: Diagnosis not present

## 2021-04-12 DIAGNOSIS — Z992 Dependence on renal dialysis: Secondary | ICD-10-CM | POA: Diagnosis not present

## 2021-04-12 DIAGNOSIS — N186 End stage renal disease: Secondary | ICD-10-CM | POA: Diagnosis not present

## 2021-04-13 DIAGNOSIS — Z992 Dependence on renal dialysis: Secondary | ICD-10-CM | POA: Diagnosis not present

## 2021-04-13 DIAGNOSIS — N186 End stage renal disease: Secondary | ICD-10-CM | POA: Diagnosis not present

## 2021-04-14 DIAGNOSIS — N186 End stage renal disease: Secondary | ICD-10-CM | POA: Diagnosis not present

## 2021-04-14 DIAGNOSIS — Z992 Dependence on renal dialysis: Secondary | ICD-10-CM | POA: Diagnosis not present

## 2021-04-15 DIAGNOSIS — Z992 Dependence on renal dialysis: Secondary | ICD-10-CM | POA: Diagnosis not present

## 2021-04-15 DIAGNOSIS — N186 End stage renal disease: Secondary | ICD-10-CM | POA: Diagnosis not present

## 2021-04-16 DIAGNOSIS — N186 End stage renal disease: Secondary | ICD-10-CM | POA: Diagnosis not present

## 2021-04-16 DIAGNOSIS — Z992 Dependence on renal dialysis: Secondary | ICD-10-CM | POA: Diagnosis not present

## 2021-04-17 DIAGNOSIS — N186 End stage renal disease: Secondary | ICD-10-CM | POA: Diagnosis not present

## 2021-04-17 DIAGNOSIS — Z992 Dependence on renal dialysis: Secondary | ICD-10-CM | POA: Diagnosis not present

## 2021-04-18 DIAGNOSIS — N186 End stage renal disease: Secondary | ICD-10-CM | POA: Diagnosis not present

## 2021-04-18 DIAGNOSIS — Z992 Dependence on renal dialysis: Secondary | ICD-10-CM | POA: Diagnosis not present

## 2021-04-19 DIAGNOSIS — N186 End stage renal disease: Secondary | ICD-10-CM | POA: Diagnosis not present

## 2021-04-19 DIAGNOSIS — Z992 Dependence on renal dialysis: Secondary | ICD-10-CM | POA: Diagnosis not present

## 2021-04-20 DIAGNOSIS — N186 End stage renal disease: Secondary | ICD-10-CM | POA: Diagnosis not present

## 2021-04-20 DIAGNOSIS — Z992 Dependence on renal dialysis: Secondary | ICD-10-CM | POA: Diagnosis not present

## 2021-04-21 DIAGNOSIS — Z992 Dependence on renal dialysis: Secondary | ICD-10-CM | POA: Diagnosis not present

## 2021-04-21 DIAGNOSIS — N186 End stage renal disease: Secondary | ICD-10-CM | POA: Diagnosis not present

## 2021-04-22 DIAGNOSIS — Z992 Dependence on renal dialysis: Secondary | ICD-10-CM | POA: Diagnosis not present

## 2021-04-22 DIAGNOSIS — N186 End stage renal disease: Secondary | ICD-10-CM | POA: Diagnosis not present

## 2021-04-23 DIAGNOSIS — Z992 Dependence on renal dialysis: Secondary | ICD-10-CM | POA: Diagnosis not present

## 2021-04-23 DIAGNOSIS — N186 End stage renal disease: Secondary | ICD-10-CM | POA: Diagnosis not present

## 2021-04-24 DIAGNOSIS — Z992 Dependence on renal dialysis: Secondary | ICD-10-CM | POA: Diagnosis not present

## 2021-04-24 DIAGNOSIS — N186 End stage renal disease: Secondary | ICD-10-CM | POA: Diagnosis not present

## 2021-04-25 DIAGNOSIS — Z992 Dependence on renal dialysis: Secondary | ICD-10-CM | POA: Diagnosis not present

## 2021-04-25 DIAGNOSIS — N186 End stage renal disease: Secondary | ICD-10-CM | POA: Diagnosis not present

## 2021-04-26 DIAGNOSIS — Z992 Dependence on renal dialysis: Secondary | ICD-10-CM | POA: Diagnosis not present

## 2021-04-26 DIAGNOSIS — N186 End stage renal disease: Secondary | ICD-10-CM | POA: Diagnosis not present

## 2021-04-27 DIAGNOSIS — N186 End stage renal disease: Secondary | ICD-10-CM | POA: Diagnosis not present

## 2021-04-27 DIAGNOSIS — Z992 Dependence on renal dialysis: Secondary | ICD-10-CM | POA: Diagnosis not present

## 2021-04-28 DIAGNOSIS — N186 End stage renal disease: Secondary | ICD-10-CM | POA: Diagnosis not present

## 2021-04-28 DIAGNOSIS — Z992 Dependence on renal dialysis: Secondary | ICD-10-CM | POA: Diagnosis not present

## 2021-04-29 DIAGNOSIS — Z992 Dependence on renal dialysis: Secondary | ICD-10-CM | POA: Diagnosis not present

## 2021-04-29 DIAGNOSIS — N186 End stage renal disease: Secondary | ICD-10-CM | POA: Diagnosis not present

## 2021-04-30 DIAGNOSIS — Z992 Dependence on renal dialysis: Secondary | ICD-10-CM | POA: Diagnosis not present

## 2021-04-30 DIAGNOSIS — N186 End stage renal disease: Secondary | ICD-10-CM | POA: Diagnosis not present

## 2021-05-01 DIAGNOSIS — Z992 Dependence on renal dialysis: Secondary | ICD-10-CM | POA: Diagnosis not present

## 2021-05-01 DIAGNOSIS — N186 End stage renal disease: Secondary | ICD-10-CM | POA: Diagnosis not present

## 2021-05-02 DIAGNOSIS — Z992 Dependence on renal dialysis: Secondary | ICD-10-CM | POA: Diagnosis not present

## 2021-05-02 DIAGNOSIS — N186 End stage renal disease: Secondary | ICD-10-CM | POA: Diagnosis not present

## 2021-05-03 DIAGNOSIS — N186 End stage renal disease: Secondary | ICD-10-CM | POA: Diagnosis not present

## 2021-05-03 DIAGNOSIS — Z992 Dependence on renal dialysis: Secondary | ICD-10-CM | POA: Diagnosis not present

## 2021-05-04 DIAGNOSIS — Z992 Dependence on renal dialysis: Secondary | ICD-10-CM | POA: Diagnosis not present

## 2021-05-04 DIAGNOSIS — N186 End stage renal disease: Secondary | ICD-10-CM | POA: Diagnosis not present

## 2021-05-04 DIAGNOSIS — H2513 Age-related nuclear cataract, bilateral: Secondary | ICD-10-CM | POA: Diagnosis not present

## 2021-05-05 DIAGNOSIS — N186 End stage renal disease: Secondary | ICD-10-CM | POA: Diagnosis not present

## 2021-05-05 DIAGNOSIS — Z992 Dependence on renal dialysis: Secondary | ICD-10-CM | POA: Diagnosis not present

## 2021-05-06 DIAGNOSIS — Z992 Dependence on renal dialysis: Secondary | ICD-10-CM | POA: Diagnosis not present

## 2021-05-06 DIAGNOSIS — N186 End stage renal disease: Secondary | ICD-10-CM | POA: Diagnosis not present

## 2021-05-07 DIAGNOSIS — Z992 Dependence on renal dialysis: Secondary | ICD-10-CM | POA: Diagnosis not present

## 2021-05-07 DIAGNOSIS — N186 End stage renal disease: Secondary | ICD-10-CM | POA: Diagnosis not present

## 2021-05-08 DIAGNOSIS — N186 End stage renal disease: Secondary | ICD-10-CM | POA: Diagnosis not present

## 2021-05-08 DIAGNOSIS — Z992 Dependence on renal dialysis: Secondary | ICD-10-CM | POA: Diagnosis not present

## 2021-05-09 DIAGNOSIS — Z992 Dependence on renal dialysis: Secondary | ICD-10-CM | POA: Diagnosis not present

## 2021-05-09 DIAGNOSIS — N186 End stage renal disease: Secondary | ICD-10-CM | POA: Diagnosis not present

## 2021-05-10 DIAGNOSIS — N186 End stage renal disease: Secondary | ICD-10-CM | POA: Diagnosis not present

## 2021-05-10 DIAGNOSIS — Z992 Dependence on renal dialysis: Secondary | ICD-10-CM | POA: Diagnosis not present

## 2021-05-11 DIAGNOSIS — N186 End stage renal disease: Secondary | ICD-10-CM | POA: Diagnosis not present

## 2021-05-11 DIAGNOSIS — Z992 Dependence on renal dialysis: Secondary | ICD-10-CM | POA: Diagnosis not present

## 2021-05-12 DIAGNOSIS — N186 End stage renal disease: Secondary | ICD-10-CM | POA: Diagnosis not present

## 2021-05-12 DIAGNOSIS — Z992 Dependence on renal dialysis: Secondary | ICD-10-CM | POA: Diagnosis not present

## 2021-05-13 DIAGNOSIS — N186 End stage renal disease: Secondary | ICD-10-CM | POA: Diagnosis not present

## 2021-05-13 DIAGNOSIS — Z992 Dependence on renal dialysis: Secondary | ICD-10-CM | POA: Diagnosis not present

## 2021-05-14 DIAGNOSIS — N186 End stage renal disease: Secondary | ICD-10-CM | POA: Diagnosis not present

## 2021-05-14 DIAGNOSIS — Z992 Dependence on renal dialysis: Secondary | ICD-10-CM | POA: Diagnosis not present

## 2021-05-15 DIAGNOSIS — Z992 Dependence on renal dialysis: Secondary | ICD-10-CM | POA: Diagnosis not present

## 2021-05-15 DIAGNOSIS — N186 End stage renal disease: Secondary | ICD-10-CM | POA: Diagnosis not present

## 2021-05-16 DIAGNOSIS — N186 End stage renal disease: Secondary | ICD-10-CM | POA: Diagnosis not present

## 2021-05-16 DIAGNOSIS — Z992 Dependence on renal dialysis: Secondary | ICD-10-CM | POA: Diagnosis not present

## 2021-05-17 DIAGNOSIS — N186 End stage renal disease: Secondary | ICD-10-CM | POA: Diagnosis not present

## 2021-05-17 DIAGNOSIS — Z992 Dependence on renal dialysis: Secondary | ICD-10-CM | POA: Diagnosis not present

## 2021-05-18 DIAGNOSIS — N186 End stage renal disease: Secondary | ICD-10-CM | POA: Diagnosis not present

## 2021-05-18 DIAGNOSIS — Z992 Dependence on renal dialysis: Secondary | ICD-10-CM | POA: Diagnosis not present

## 2021-05-19 DIAGNOSIS — Z992 Dependence on renal dialysis: Secondary | ICD-10-CM | POA: Diagnosis not present

## 2021-05-19 DIAGNOSIS — N186 End stage renal disease: Secondary | ICD-10-CM | POA: Diagnosis not present

## 2021-05-20 DIAGNOSIS — N186 End stage renal disease: Secondary | ICD-10-CM | POA: Diagnosis not present

## 2021-05-20 DIAGNOSIS — Z992 Dependence on renal dialysis: Secondary | ICD-10-CM | POA: Diagnosis not present

## 2021-05-21 DIAGNOSIS — Z992 Dependence on renal dialysis: Secondary | ICD-10-CM | POA: Diagnosis not present

## 2021-05-21 DIAGNOSIS — N186 End stage renal disease: Secondary | ICD-10-CM | POA: Diagnosis not present

## 2021-05-22 DIAGNOSIS — Z992 Dependence on renal dialysis: Secondary | ICD-10-CM | POA: Diagnosis not present

## 2021-05-22 DIAGNOSIS — N186 End stage renal disease: Secondary | ICD-10-CM | POA: Diagnosis not present

## 2021-05-23 DIAGNOSIS — N186 End stage renal disease: Secondary | ICD-10-CM | POA: Diagnosis not present

## 2021-05-23 DIAGNOSIS — Z992 Dependence on renal dialysis: Secondary | ICD-10-CM | POA: Diagnosis not present

## 2021-05-24 DIAGNOSIS — Z992 Dependence on renal dialysis: Secondary | ICD-10-CM | POA: Diagnosis not present

## 2021-05-24 DIAGNOSIS — N186 End stage renal disease: Secondary | ICD-10-CM | POA: Diagnosis not present

## 2021-05-25 DIAGNOSIS — Z992 Dependence on renal dialysis: Secondary | ICD-10-CM | POA: Diagnosis not present

## 2021-05-25 DIAGNOSIS — N186 End stage renal disease: Secondary | ICD-10-CM | POA: Diagnosis not present

## 2021-05-26 DIAGNOSIS — N186 End stage renal disease: Secondary | ICD-10-CM | POA: Diagnosis not present

## 2021-05-26 DIAGNOSIS — Z992 Dependence on renal dialysis: Secondary | ICD-10-CM | POA: Diagnosis not present

## 2021-05-27 DIAGNOSIS — N186 End stage renal disease: Secondary | ICD-10-CM | POA: Diagnosis not present

## 2021-05-27 DIAGNOSIS — Z992 Dependence on renal dialysis: Secondary | ICD-10-CM | POA: Diagnosis not present

## 2021-05-28 DIAGNOSIS — Z992 Dependence on renal dialysis: Secondary | ICD-10-CM | POA: Diagnosis not present

## 2021-05-28 DIAGNOSIS — N186 End stage renal disease: Secondary | ICD-10-CM | POA: Diagnosis not present

## 2021-05-29 DIAGNOSIS — N186 End stage renal disease: Secondary | ICD-10-CM | POA: Diagnosis not present

## 2021-05-29 DIAGNOSIS — Z992 Dependence on renal dialysis: Secondary | ICD-10-CM | POA: Diagnosis not present

## 2021-05-30 DIAGNOSIS — Z992 Dependence on renal dialysis: Secondary | ICD-10-CM | POA: Diagnosis not present

## 2021-05-30 DIAGNOSIS — N186 End stage renal disease: Secondary | ICD-10-CM | POA: Diagnosis not present

## 2021-05-31 DIAGNOSIS — Z131 Encounter for screening for diabetes mellitus: Secondary | ICD-10-CM | POA: Diagnosis not present

## 2021-05-31 DIAGNOSIS — N186 End stage renal disease: Secondary | ICD-10-CM | POA: Diagnosis not present

## 2021-05-31 DIAGNOSIS — Z992 Dependence on renal dialysis: Secondary | ICD-10-CM | POA: Diagnosis not present

## 2021-06-01 DIAGNOSIS — Z992 Dependence on renal dialysis: Secondary | ICD-10-CM | POA: Diagnosis not present

## 2021-06-01 DIAGNOSIS — N186 End stage renal disease: Secondary | ICD-10-CM | POA: Diagnosis not present

## 2021-06-02 DIAGNOSIS — Z992 Dependence on renal dialysis: Secondary | ICD-10-CM | POA: Diagnosis not present

## 2021-06-02 DIAGNOSIS — N186 End stage renal disease: Secondary | ICD-10-CM | POA: Diagnosis not present

## 2021-06-03 DIAGNOSIS — Z992 Dependence on renal dialysis: Secondary | ICD-10-CM | POA: Diagnosis not present

## 2021-06-03 DIAGNOSIS — N186 End stage renal disease: Secondary | ICD-10-CM | POA: Diagnosis not present

## 2021-06-04 DIAGNOSIS — N186 End stage renal disease: Secondary | ICD-10-CM | POA: Diagnosis not present

## 2021-06-04 DIAGNOSIS — Z992 Dependence on renal dialysis: Secondary | ICD-10-CM | POA: Diagnosis not present

## 2021-06-05 DIAGNOSIS — N186 End stage renal disease: Secondary | ICD-10-CM | POA: Diagnosis not present

## 2021-06-05 DIAGNOSIS — Z992 Dependence on renal dialysis: Secondary | ICD-10-CM | POA: Diagnosis not present

## 2021-06-06 DIAGNOSIS — Z992 Dependence on renal dialysis: Secondary | ICD-10-CM | POA: Diagnosis not present

## 2021-06-06 DIAGNOSIS — N186 End stage renal disease: Secondary | ICD-10-CM | POA: Diagnosis not present

## 2021-06-07 DIAGNOSIS — Z992 Dependence on renal dialysis: Secondary | ICD-10-CM | POA: Diagnosis not present

## 2021-06-07 DIAGNOSIS — N186 End stage renal disease: Secondary | ICD-10-CM | POA: Diagnosis not present

## 2021-06-08 DIAGNOSIS — N186 End stage renal disease: Secondary | ICD-10-CM | POA: Diagnosis not present

## 2021-06-08 DIAGNOSIS — Z992 Dependence on renal dialysis: Secondary | ICD-10-CM | POA: Diagnosis not present

## 2021-06-09 DIAGNOSIS — Z992 Dependence on renal dialysis: Secondary | ICD-10-CM | POA: Diagnosis not present

## 2021-06-09 DIAGNOSIS — N186 End stage renal disease: Secondary | ICD-10-CM | POA: Diagnosis not present

## 2021-06-10 DIAGNOSIS — N186 End stage renal disease: Secondary | ICD-10-CM | POA: Diagnosis not present

## 2021-06-10 DIAGNOSIS — Z992 Dependence on renal dialysis: Secondary | ICD-10-CM | POA: Diagnosis not present

## 2021-06-11 DIAGNOSIS — Z992 Dependence on renal dialysis: Secondary | ICD-10-CM | POA: Diagnosis not present

## 2021-06-11 DIAGNOSIS — N186 End stage renal disease: Secondary | ICD-10-CM | POA: Diagnosis not present

## 2021-06-12 DIAGNOSIS — N186 End stage renal disease: Secondary | ICD-10-CM | POA: Diagnosis not present

## 2021-06-12 DIAGNOSIS — Z992 Dependence on renal dialysis: Secondary | ICD-10-CM | POA: Diagnosis not present

## 2021-06-13 DIAGNOSIS — N186 End stage renal disease: Secondary | ICD-10-CM | POA: Diagnosis not present

## 2021-06-13 DIAGNOSIS — Z992 Dependence on renal dialysis: Secondary | ICD-10-CM | POA: Diagnosis not present

## 2021-06-14 DIAGNOSIS — N186 End stage renal disease: Secondary | ICD-10-CM | POA: Diagnosis not present

## 2021-06-14 DIAGNOSIS — Z992 Dependence on renal dialysis: Secondary | ICD-10-CM | POA: Diagnosis not present

## 2021-06-15 DIAGNOSIS — N186 End stage renal disease: Secondary | ICD-10-CM | POA: Diagnosis not present

## 2021-06-15 DIAGNOSIS — Z992 Dependence on renal dialysis: Secondary | ICD-10-CM | POA: Diagnosis not present

## 2021-06-16 DIAGNOSIS — N186 End stage renal disease: Secondary | ICD-10-CM | POA: Diagnosis not present

## 2021-06-16 DIAGNOSIS — Z992 Dependence on renal dialysis: Secondary | ICD-10-CM | POA: Diagnosis not present

## 2021-06-17 DIAGNOSIS — Z992 Dependence on renal dialysis: Secondary | ICD-10-CM | POA: Diagnosis not present

## 2021-06-17 DIAGNOSIS — N186 End stage renal disease: Secondary | ICD-10-CM | POA: Diagnosis not present

## 2021-06-18 DIAGNOSIS — Z992 Dependence on renal dialysis: Secondary | ICD-10-CM | POA: Diagnosis not present

## 2021-06-18 DIAGNOSIS — N186 End stage renal disease: Secondary | ICD-10-CM | POA: Diagnosis not present

## 2021-06-19 DIAGNOSIS — N186 End stage renal disease: Secondary | ICD-10-CM | POA: Diagnosis not present

## 2021-06-19 DIAGNOSIS — Z992 Dependence on renal dialysis: Secondary | ICD-10-CM | POA: Diagnosis not present

## 2021-06-20 DIAGNOSIS — N186 End stage renal disease: Secondary | ICD-10-CM | POA: Diagnosis not present

## 2021-06-20 DIAGNOSIS — Z992 Dependence on renal dialysis: Secondary | ICD-10-CM | POA: Diagnosis not present

## 2021-06-21 DIAGNOSIS — N186 End stage renal disease: Secondary | ICD-10-CM | POA: Diagnosis not present

## 2021-06-21 DIAGNOSIS — Z992 Dependence on renal dialysis: Secondary | ICD-10-CM | POA: Diagnosis not present

## 2021-06-22 DIAGNOSIS — N186 End stage renal disease: Secondary | ICD-10-CM | POA: Diagnosis not present

## 2021-06-22 DIAGNOSIS — Z992 Dependence on renal dialysis: Secondary | ICD-10-CM | POA: Diagnosis not present

## 2021-06-23 DIAGNOSIS — Z992 Dependence on renal dialysis: Secondary | ICD-10-CM | POA: Diagnosis not present

## 2021-06-23 DIAGNOSIS — N186 End stage renal disease: Secondary | ICD-10-CM | POA: Diagnosis not present

## 2021-06-24 DIAGNOSIS — N186 End stage renal disease: Secondary | ICD-10-CM | POA: Diagnosis not present

## 2021-06-24 DIAGNOSIS — Z992 Dependence on renal dialysis: Secondary | ICD-10-CM | POA: Diagnosis not present

## 2021-06-25 DIAGNOSIS — Z1389 Encounter for screening for other disorder: Secondary | ICD-10-CM | POA: Diagnosis not present

## 2021-06-25 DIAGNOSIS — M958 Other specified acquired deformities of musculoskeletal system: Secondary | ICD-10-CM | POA: Diagnosis not present

## 2021-06-25 DIAGNOSIS — Z0000 Encounter for general adult medical examination without abnormal findings: Secondary | ICD-10-CM | POA: Diagnosis not present

## 2021-06-25 DIAGNOSIS — M1A30X0 Chronic gout due to renal impairment, unspecified site, without tophus (tophi): Secondary | ICD-10-CM | POA: Diagnosis not present

## 2021-06-25 DIAGNOSIS — G809 Cerebral palsy, unspecified: Secondary | ICD-10-CM | POA: Diagnosis not present

## 2021-06-25 DIAGNOSIS — E669 Obesity, unspecified: Secondary | ICD-10-CM | POA: Diagnosis not present

## 2021-06-25 DIAGNOSIS — I120 Hypertensive chronic kidney disease with stage 5 chronic kidney disease or end stage renal disease: Secondary | ICD-10-CM | POA: Diagnosis not present

## 2021-06-25 DIAGNOSIS — K219 Gastro-esophageal reflux disease without esophagitis: Secondary | ICD-10-CM | POA: Diagnosis not present

## 2021-06-25 DIAGNOSIS — M25572 Pain in left ankle and joints of left foot: Secondary | ICD-10-CM | POA: Diagnosis not present

## 2021-06-25 DIAGNOSIS — Z992 Dependence on renal dialysis: Secondary | ICD-10-CM | POA: Diagnosis not present

## 2021-06-25 DIAGNOSIS — N186 End stage renal disease: Secondary | ICD-10-CM | POA: Diagnosis not present

## 2021-06-25 DIAGNOSIS — N2581 Secondary hyperparathyroidism of renal origin: Secondary | ICD-10-CM | POA: Diagnosis not present

## 2021-06-25 DIAGNOSIS — E7800 Pure hypercholesterolemia, unspecified: Secondary | ICD-10-CM | POA: Diagnosis not present

## 2021-06-26 DIAGNOSIS — Z992 Dependence on renal dialysis: Secondary | ICD-10-CM | POA: Diagnosis not present

## 2021-06-26 DIAGNOSIS — N186 End stage renal disease: Secondary | ICD-10-CM | POA: Diagnosis not present

## 2021-06-27 DIAGNOSIS — N186 End stage renal disease: Secondary | ICD-10-CM | POA: Diagnosis not present

## 2021-06-27 DIAGNOSIS — Z992 Dependence on renal dialysis: Secondary | ICD-10-CM | POA: Diagnosis not present

## 2021-06-28 DIAGNOSIS — N186 End stage renal disease: Secondary | ICD-10-CM | POA: Diagnosis not present

## 2021-06-28 DIAGNOSIS — Z992 Dependence on renal dialysis: Secondary | ICD-10-CM | POA: Diagnosis not present

## 2021-06-29 DIAGNOSIS — N186 End stage renal disease: Secondary | ICD-10-CM | POA: Diagnosis not present

## 2021-06-29 DIAGNOSIS — Z992 Dependence on renal dialysis: Secondary | ICD-10-CM | POA: Diagnosis not present

## 2021-06-30 DIAGNOSIS — Z992 Dependence on renal dialysis: Secondary | ICD-10-CM | POA: Diagnosis not present

## 2021-06-30 DIAGNOSIS — N186 End stage renal disease: Secondary | ICD-10-CM | POA: Diagnosis not present

## 2021-07-01 DIAGNOSIS — Z992 Dependence on renal dialysis: Secondary | ICD-10-CM | POA: Diagnosis not present

## 2021-07-01 DIAGNOSIS — N186 End stage renal disease: Secondary | ICD-10-CM | POA: Diagnosis not present

## 2021-07-02 DIAGNOSIS — N186 End stage renal disease: Secondary | ICD-10-CM | POA: Diagnosis not present

## 2021-07-02 DIAGNOSIS — Z992 Dependence on renal dialysis: Secondary | ICD-10-CM | POA: Diagnosis not present

## 2021-07-03 DIAGNOSIS — N186 End stage renal disease: Secondary | ICD-10-CM | POA: Diagnosis not present

## 2021-07-03 DIAGNOSIS — Z992 Dependence on renal dialysis: Secondary | ICD-10-CM | POA: Diagnosis not present

## 2021-07-04 DIAGNOSIS — N186 End stage renal disease: Secondary | ICD-10-CM | POA: Diagnosis not present

## 2021-07-04 DIAGNOSIS — Z992 Dependence on renal dialysis: Secondary | ICD-10-CM | POA: Diagnosis not present

## 2021-07-05 DIAGNOSIS — Z992 Dependence on renal dialysis: Secondary | ICD-10-CM | POA: Diagnosis not present

## 2021-07-05 DIAGNOSIS — N186 End stage renal disease: Secondary | ICD-10-CM | POA: Diagnosis not present

## 2021-07-06 DIAGNOSIS — N186 End stage renal disease: Secondary | ICD-10-CM | POA: Diagnosis not present

## 2021-07-06 DIAGNOSIS — Z992 Dependence on renal dialysis: Secondary | ICD-10-CM | POA: Diagnosis not present

## 2021-07-07 DIAGNOSIS — N186 End stage renal disease: Secondary | ICD-10-CM | POA: Diagnosis not present

## 2021-07-07 DIAGNOSIS — Z992 Dependence on renal dialysis: Secondary | ICD-10-CM | POA: Diagnosis not present

## 2021-07-08 DIAGNOSIS — Z992 Dependence on renal dialysis: Secondary | ICD-10-CM | POA: Diagnosis not present

## 2021-07-08 DIAGNOSIS — N186 End stage renal disease: Secondary | ICD-10-CM | POA: Diagnosis not present

## 2021-07-09 DIAGNOSIS — N186 End stage renal disease: Secondary | ICD-10-CM | POA: Diagnosis not present

## 2021-07-09 DIAGNOSIS — Z992 Dependence on renal dialysis: Secondary | ICD-10-CM | POA: Diagnosis not present

## 2021-07-10 DIAGNOSIS — N186 End stage renal disease: Secondary | ICD-10-CM | POA: Diagnosis not present

## 2021-07-10 DIAGNOSIS — Z992 Dependence on renal dialysis: Secondary | ICD-10-CM | POA: Diagnosis not present

## 2021-07-11 DIAGNOSIS — Z992 Dependence on renal dialysis: Secondary | ICD-10-CM | POA: Diagnosis not present

## 2021-07-11 DIAGNOSIS — N186 End stage renal disease: Secondary | ICD-10-CM | POA: Diagnosis not present

## 2021-07-12 DIAGNOSIS — Z992 Dependence on renal dialysis: Secondary | ICD-10-CM | POA: Diagnosis not present

## 2021-07-12 DIAGNOSIS — N186 End stage renal disease: Secondary | ICD-10-CM | POA: Diagnosis not present

## 2021-07-13 DIAGNOSIS — N186 End stage renal disease: Secondary | ICD-10-CM | POA: Diagnosis not present

## 2021-07-13 DIAGNOSIS — Z992 Dependence on renal dialysis: Secondary | ICD-10-CM | POA: Diagnosis not present

## 2021-07-14 DIAGNOSIS — Z992 Dependence on renal dialysis: Secondary | ICD-10-CM | POA: Diagnosis not present

## 2021-07-14 DIAGNOSIS — N186 End stage renal disease: Secondary | ICD-10-CM | POA: Diagnosis not present

## 2021-07-15 DIAGNOSIS — N186 End stage renal disease: Secondary | ICD-10-CM | POA: Diagnosis not present

## 2021-07-15 DIAGNOSIS — Z992 Dependence on renal dialysis: Secondary | ICD-10-CM | POA: Diagnosis not present

## 2021-07-16 DIAGNOSIS — N186 End stage renal disease: Secondary | ICD-10-CM | POA: Diagnosis not present

## 2021-07-16 DIAGNOSIS — Z992 Dependence on renal dialysis: Secondary | ICD-10-CM | POA: Diagnosis not present

## 2021-07-17 DIAGNOSIS — N186 End stage renal disease: Secondary | ICD-10-CM | POA: Diagnosis not present

## 2021-07-17 DIAGNOSIS — Z992 Dependence on renal dialysis: Secondary | ICD-10-CM | POA: Diagnosis not present

## 2021-07-18 ENCOUNTER — Ambulatory Visit
Admission: RE | Admit: 2021-07-18 | Discharge: 2021-07-18 | Disposition: A | Discharge status: Home / Self Care | Payer: Medicare HMO | Source: Ambulatory Visit | Attending: Nephrology | Admitting: Nephrology

## 2021-07-18 ENCOUNTER — Other Ambulatory Visit: Payer: Self-pay

## 2021-07-18 ENCOUNTER — Other Ambulatory Visit: Payer: Self-pay | Admitting: Nephrology

## 2021-07-18 ENCOUNTER — Ambulatory Visit
Admission: RE | Admit: 2021-07-18 | Discharge: 2021-07-18 | Disposition: A | Discharge status: Home / Self Care | Payer: Medicare HMO | Attending: Nephrology | Admitting: Nephrology

## 2021-07-18 DIAGNOSIS — N186 End stage renal disease: Secondary | ICD-10-CM

## 2021-07-18 DIAGNOSIS — Z992 Dependence on renal dialysis: Secondary | ICD-10-CM | POA: Diagnosis not present

## 2021-07-19 DIAGNOSIS — Z992 Dependence on renal dialysis: Secondary | ICD-10-CM | POA: Diagnosis not present

## 2021-07-19 DIAGNOSIS — N186 End stage renal disease: Secondary | ICD-10-CM | POA: Diagnosis not present

## 2021-07-20 DIAGNOSIS — N186 End stage renal disease: Secondary | ICD-10-CM | POA: Diagnosis not present

## 2021-07-20 DIAGNOSIS — Z992 Dependence on renal dialysis: Secondary | ICD-10-CM | POA: Diagnosis not present

## 2021-07-21 DIAGNOSIS — N186 End stage renal disease: Secondary | ICD-10-CM | POA: Diagnosis not present

## 2021-07-21 DIAGNOSIS — Z992 Dependence on renal dialysis: Secondary | ICD-10-CM | POA: Diagnosis not present

## 2021-07-22 DIAGNOSIS — N186 End stage renal disease: Secondary | ICD-10-CM | POA: Diagnosis not present

## 2021-07-22 DIAGNOSIS — Z992 Dependence on renal dialysis: Secondary | ICD-10-CM | POA: Diagnosis not present

## 2021-07-23 DIAGNOSIS — Z992 Dependence on renal dialysis: Secondary | ICD-10-CM | POA: Diagnosis not present

## 2021-07-23 DIAGNOSIS — N186 End stage renal disease: Secondary | ICD-10-CM | POA: Diagnosis not present

## 2021-07-24 DIAGNOSIS — Z992 Dependence on renal dialysis: Secondary | ICD-10-CM | POA: Diagnosis not present

## 2021-07-24 DIAGNOSIS — N186 End stage renal disease: Secondary | ICD-10-CM | POA: Diagnosis not present

## 2021-07-25 DIAGNOSIS — N186 End stage renal disease: Secondary | ICD-10-CM | POA: Diagnosis not present

## 2021-07-25 DIAGNOSIS — Z992 Dependence on renal dialysis: Secondary | ICD-10-CM | POA: Diagnosis not present

## 2021-07-26 DIAGNOSIS — Z992 Dependence on renal dialysis: Secondary | ICD-10-CM | POA: Diagnosis not present

## 2021-07-26 DIAGNOSIS — N186 End stage renal disease: Secondary | ICD-10-CM | POA: Diagnosis not present

## 2021-07-27 DIAGNOSIS — Z992 Dependence on renal dialysis: Secondary | ICD-10-CM | POA: Diagnosis not present

## 2021-07-27 DIAGNOSIS — N186 End stage renal disease: Secondary | ICD-10-CM | POA: Diagnosis not present

## 2021-07-28 DIAGNOSIS — Z992 Dependence on renal dialysis: Secondary | ICD-10-CM | POA: Diagnosis not present

## 2021-07-28 DIAGNOSIS — N186 End stage renal disease: Secondary | ICD-10-CM | POA: Diagnosis not present

## 2021-07-29 DIAGNOSIS — N186 End stage renal disease: Secondary | ICD-10-CM | POA: Diagnosis not present

## 2021-07-29 DIAGNOSIS — Z992 Dependence on renal dialysis: Secondary | ICD-10-CM | POA: Diagnosis not present

## 2021-07-30 DIAGNOSIS — N186 End stage renal disease: Secondary | ICD-10-CM | POA: Diagnosis not present

## 2021-07-30 DIAGNOSIS — Z992 Dependence on renal dialysis: Secondary | ICD-10-CM | POA: Diagnosis not present

## 2021-07-31 DIAGNOSIS — N186 End stage renal disease: Secondary | ICD-10-CM | POA: Diagnosis not present

## 2021-07-31 DIAGNOSIS — Z992 Dependence on renal dialysis: Secondary | ICD-10-CM | POA: Diagnosis not present

## 2021-08-01 ENCOUNTER — Other Ambulatory Visit: Payer: Self-pay | Admitting: Nephrology

## 2021-08-01 DIAGNOSIS — Z992 Dependence on renal dialysis: Secondary | ICD-10-CM

## 2021-08-01 DIAGNOSIS — N186 End stage renal disease: Secondary | ICD-10-CM | POA: Diagnosis not present

## 2021-08-01 DIAGNOSIS — K469 Unspecified abdominal hernia without obstruction or gangrene: Secondary | ICD-10-CM

## 2021-08-02 DIAGNOSIS — Z992 Dependence on renal dialysis: Secondary | ICD-10-CM | POA: Diagnosis not present

## 2021-08-02 DIAGNOSIS — N186 End stage renal disease: Secondary | ICD-10-CM | POA: Diagnosis not present

## 2021-08-03 DIAGNOSIS — Z992 Dependence on renal dialysis: Secondary | ICD-10-CM | POA: Diagnosis not present

## 2021-08-03 DIAGNOSIS — N186 End stage renal disease: Secondary | ICD-10-CM | POA: Diagnosis not present

## 2021-08-04 DIAGNOSIS — Z992 Dependence on renal dialysis: Secondary | ICD-10-CM | POA: Diagnosis not present

## 2021-08-04 DIAGNOSIS — N186 End stage renal disease: Secondary | ICD-10-CM | POA: Diagnosis not present

## 2021-08-05 DIAGNOSIS — N186 End stage renal disease: Secondary | ICD-10-CM | POA: Diagnosis not present

## 2021-08-05 DIAGNOSIS — Z992 Dependence on renal dialysis: Secondary | ICD-10-CM | POA: Diagnosis not present

## 2021-08-06 DIAGNOSIS — Z992 Dependence on renal dialysis: Secondary | ICD-10-CM | POA: Diagnosis not present

## 2021-08-06 DIAGNOSIS — N186 End stage renal disease: Secondary | ICD-10-CM | POA: Diagnosis not present

## 2021-08-07 DIAGNOSIS — N186 End stage renal disease: Secondary | ICD-10-CM | POA: Diagnosis not present

## 2021-08-07 DIAGNOSIS — Z992 Dependence on renal dialysis: Secondary | ICD-10-CM | POA: Diagnosis not present

## 2021-08-08 DIAGNOSIS — Z992 Dependence on renal dialysis: Secondary | ICD-10-CM | POA: Diagnosis not present

## 2021-08-08 DIAGNOSIS — N186 End stage renal disease: Secondary | ICD-10-CM | POA: Diagnosis not present

## 2021-08-08 DIAGNOSIS — I871 Compression of vein: Secondary | ICD-10-CM | POA: Diagnosis not present

## 2021-08-10 DIAGNOSIS — N186 End stage renal disease: Secondary | ICD-10-CM | POA: Diagnosis not present

## 2021-08-10 DIAGNOSIS — Z992 Dependence on renal dialysis: Secondary | ICD-10-CM | POA: Diagnosis not present

## 2021-08-13 ENCOUNTER — Encounter: Payer: Self-pay | Admitting: Surgery

## 2021-08-13 ENCOUNTER — Telehealth: Payer: Self-pay | Admitting: Surgery

## 2021-08-13 ENCOUNTER — Other Ambulatory Visit: Payer: Self-pay

## 2021-08-13 ENCOUNTER — Ambulatory Visit (INDEPENDENT_AMBULATORY_CARE_PROVIDER_SITE_OTHER): Payer: Medicare HMO | Admitting: Surgery

## 2021-08-13 VITALS — BP 167/95 | HR 97 | Temp 98.7°F | Ht 66.0 in | Wt 181.4 lb

## 2021-08-13 DIAGNOSIS — N186 End stage renal disease: Secondary | ICD-10-CM | POA: Diagnosis not present

## 2021-08-13 DIAGNOSIS — Z992 Dependence on renal dialysis: Secondary | ICD-10-CM | POA: Diagnosis not present

## 2021-08-13 DIAGNOSIS — T85618A Breakdown (mechanical) of other specified internal prosthetic devices, implants and grafts, initial encounter: Secondary | ICD-10-CM | POA: Diagnosis not present

## 2021-08-13 DIAGNOSIS — K4090 Unilateral inguinal hernia, without obstruction or gangrene, not specified as recurrent: Secondary | ICD-10-CM

## 2021-08-13 NOTE — Telephone Encounter (Signed)
Spoke with niece Lynelle Smoke and they are aware of the following regarding scheduled surgery. ? ?Pre-Admission date/time, COVID Testing date and Surgery date. ? ?Surgery Date: 08/16/21 ?Preadmission Testing Date: 08/14/21 (phone 1p-5p) ?Covid Testing Date: Not needed.   ? ?Patient has been made aware to call [phone], between 1-3:00pm the day before surgery, to find out what time to arrive for surgery.   ? ?

## 2021-08-13 NOTE — H&P (View-Only) (Signed)
Outpatient Surgical Follow Up ? ?08/13/2021 ? ?CARVIN ALMAS is an 66 y.o. male.  ? ?Chief Complaint  ?Patient presents with  ? Follow-up  ?  PD cath not draining  ? ? ?HPI: Ansley is a 66 year old male well-known to me with a history of end-stage renal disease.  I did place PD catheter over a year ago.  It did function but over the last few weeks has progressively had issues with patency.  Dialysis center has tried heparin and multiple local therapies without success.  He is currently now being dialyzed Monday Wednesday and Friday via right IJ permacath.  He still having significant leg swelling.  No fevers no chills.  No abdominal pain.  He is having bowel movements he is tolerating diet. ? ?Past Medical History:  ?Diagnosis Date  ? Arthritis   ? Cerebral palsy ([place])   ? Chronic kidney disease   ? Deficiency of vitamin B12   ? Encephalitis   ? GERD (gastroesophageal reflux disease)   ? Hemorrhoid   ? Hypercholesterolemia   ? Hyperkalemia   ? Hypertension   ? ? ?Past Surgical History:  ?Procedure Laterality Date  ? CAPD INSERTION N/A 08/10/2020  ? Procedure: LAPAROSCOPIC INSERTION CONTINUOUS AMBULATORY PERITONEAL DIALYSIS  (CAPD) CATHETER;  Surgeon: Jules Husbands, MD;  Location: [facility] ORS;  Service: General;  Laterality: N/A;  ? COLONOSCOPY WITH PROPOFOL N/A 05/25/2015  ? Procedure: COLONOSCOPY WITH PROPOFOL;  Surgeon: Manya Silvas, MD;  Location: [facility] ENDOSCOPY;  Service: Endoscopy;  Laterality: N/A;  ? COLONOSCOPY WITH PROPOFOL N/A 05/12/2020  ? Procedure: COLONOSCOPY WITH PROPOFOL;  Surgeon: Lesly Rubenstein, MD;  Location: [facility] ENDOSCOPY;  Service: Endoscopy;  Laterality: N/A;  ? ESOPHAGOGASTRODUODENOSCOPY (EGD) WITH PROPOFOL N/A 05/12/2020  ? Procedure: ESOPHAGOGASTRODUODENOSCOPY (EGD) WITH PROPOFOL;  Surgeon: Lesly Rubenstein, MD;  Location: [facility] ENDOSCOPY;  Service: Endoscopy;  Laterality: N/A;  ? FRACTURE SURGERY Left 1997  ? arm  ? ? ?History reviewed. No pertinent family history. ? ?Social  History:  reports that he has never smoked. He has never used smokeless tobacco. He reports that he does not drink alcohol and does not use drugs. ? ?Allergies:  ?Allergies  ?Allergen Reactions  ? Cefuroxime Rash  ? Tramadol Nausea Only and Other (See Comments)  ?  Dizziness  ? ? ?Medications reviewed. ? ? ? ?ROS ?Full ROS performed and is otherwise negative other than what is stated in HPI ? ? ?BP (!) 167/95   Pulse 97   Temp 98.7 ?F (37.1 ?C) (Oral)   Ht '5\' 6"'$  (1.676 m)   Wt 181 lb 6.4 oz (82.3 kg)   SpO2 98%   BMI 29.28 kg/m?  ? ?Physical Exam ?Vitals and nursing note reviewed. Exam conducted with a chaperone present.  ?Constitutional:   ?   General: He is not in acute distress. ?   Appearance: Normal appearance. He is not ill-appearing.  ?Cardiovascular:  ?   Rate and Rhythm: Normal rate and regular rhythm.  ?Pulmonary:  ?   Effort: Pulmonary effort is normal. No respiratory distress.  ?   Breath sounds: Normal breath sounds. No stridor. No wheezing or rhonchi.  ?Abdominal:  ?   General: Abdomen is flat. There is no distension.  ?   Palpations: Abdomen is soft. There is no mass.  ?   Tenderness: There is no abdominal tenderness. There is no guarding or rebound.  ?   Hernia: No hernia is present.  ?   Comments: PD cath in place,  no infection, no peritonitis  ?Musculoskeletal:     ?   General: Swelling present. Normal range of motion.  ?   Cervical back: Normal range of motion and neck supple. No rigidity or tenderness.  ?Skin: ?   General: Skin is warm and dry.  ?   Capillary Refill: Capillary refill takes less than 2 seconds.  ?   Coloration: Skin is not jaundiced or pale.  ?   Findings: No bruising or erythema.  ?Neurological:  ?   General: No focal deficit present.  ?   Mental Status: He is alert and oriented to person, place, and time.  ?Psychiatric:     ?   Mood and Affect: Mood normal.     ?   Behavior: Behavior normal.     ?   Thought Content: Thought content normal.     ?   Judgment: Judgment  normal.  ? ? ?Assessment/Plan: ?66 year old male with PD catheter malfunction.  Discussed with the patient in detail.  Local treatments have been attempted to regain patency of the catheter.  Unfortunately none of them have worked.  Discussed with him in detail about my thought process.  I do think that we will need to revise it.  Procedure discussed with the patient and the family in detail.  Risks, benefits and possible complications including but not limited to: Bleeding, infection injury to abdominal structures.  Catheter malfunction.  We also discussed that sometimes it is catheters will malfunction again due to debris and fibrin clogs. We will do it this Thursday as not to interfere w HD day. ?D/W Dr Candiss Norse in detail ?Please note that I spent more than 40 minutes in this encounter including personally reviewing records, coordinating his care, placing orders and performing appropriate documentation ? ?Caroleen Hamman, MD FACS ?General Surgeon  ?

## 2021-08-13 NOTE — Patient Instructions (Signed)
Our surgery scheduler will call to schedule your surgery within 24-48 hours. Please have the [place] surgery sheet available when speaking with her.  ?

## 2021-08-13 NOTE — Progress Notes (Signed)
Outpatient Surgical Follow Up ? ?08/13/2021 ? ?James Black is an 66 y.o. male.  ? ?Chief Complaint  ?Patient presents with  ? Follow-up  ?  PD cath not draining  ? ? ?HPI: James Black is a 66 year old male well-known to me with a history of end-stage renal disease.  I did place PD catheter over a year ago.  It did function but over the last few weeks has progressively had issues with patency.  Dialysis center has tried heparin and multiple local therapies without success.  He is currently now being dialyzed Monday Wednesday and Friday via right IJ permacath.  He still having significant leg swelling.  No fevers no chills.  No abdominal pain.  He is having bowel movements he is tolerating diet. ? ?Past Medical History:  ?Diagnosis Date  ? Arthritis   ? Cerebral palsy ([place])   ? Chronic kidney disease   ? Deficiency of vitamin B12   ? Encephalitis   ? GERD (gastroesophageal reflux disease)   ? Hemorrhoid   ? Hypercholesterolemia   ? Hyperkalemia   ? Hypertension   ? ? ?Past Surgical History:  ?Procedure Laterality Date  ? CAPD INSERTION N/A 08/10/2020  ? Procedure: LAPAROSCOPIC INSERTION CONTINUOUS AMBULATORY PERITONEAL DIALYSIS  (CAPD) CATHETER;  Surgeon: Jules Husbands, MD;  Location: [facility] ORS;  Service: General;  Laterality: N/A;  ? COLONOSCOPY WITH PROPOFOL N/A 05/25/2015  ? Procedure: COLONOSCOPY WITH PROPOFOL;  Surgeon: Manya Silvas, MD;  Location: [facility] ENDOSCOPY;  Service: Endoscopy;  Laterality: N/A;  ? COLONOSCOPY WITH PROPOFOL N/A 05/12/2020  ? Procedure: COLONOSCOPY WITH PROPOFOL;  Surgeon: Lesly Rubenstein, MD;  Location: [facility] ENDOSCOPY;  Service: Endoscopy;  Laterality: N/A;  ? ESOPHAGOGASTRODUODENOSCOPY (EGD) WITH PROPOFOL N/A 05/12/2020  ? Procedure: ESOPHAGOGASTRODUODENOSCOPY (EGD) WITH PROPOFOL;  Surgeon: Lesly Rubenstein, MD;  Location: [facility] ENDOSCOPY;  Service: Endoscopy;  Laterality: N/A;  ? FRACTURE SURGERY Left 1997  ? arm  ? ? ?History reviewed. No pertinent family history. ? ?Social  History:  reports that he has never smoked. He has never used smokeless tobacco. He reports that he does not drink alcohol and does not use drugs. ? ?Allergies:  ?Allergies  ?Allergen Reactions  ? Cefuroxime Rash  ? Tramadol Nausea Only and Other (See Comments)  ?  Dizziness  ? ? ?Medications reviewed. ? ? ? ?ROS ?Full ROS performed and is otherwise negative other than what is stated in HPI ? ? ?BP (!) 167/95   Pulse 97   Temp 98.7 ?F (37.1 ?C) (Oral)   Ht '5\' 6"'$  (1.676 m)   Wt 181 lb 6.4 oz (82.3 kg)   SpO2 98%   BMI 29.28 kg/m?  ? ?Physical Exam ?Vitals and nursing note reviewed. Exam conducted with a chaperone present.  ?Constitutional:   ?   General: He is not in acute distress. ?   Appearance: Normal appearance. He is not ill-appearing.  ?Cardiovascular:  ?   Rate and Rhythm: Normal rate and regular rhythm.  ?Pulmonary:  ?   Effort: Pulmonary effort is normal. No respiratory distress.  ?   Breath sounds: Normal breath sounds. No stridor. No wheezing or rhonchi.  ?Abdominal:  ?   General: Abdomen is flat. There is no distension.  ?   Palpations: Abdomen is soft. There is no mass.  ?   Tenderness: There is no abdominal tenderness. There is no guarding or rebound.  ?   Hernia: No hernia is present.  ?   Comments: PD cath in place,  no infection, no peritonitis  ?Musculoskeletal:     ?   General: Swelling present. Normal range of motion.  ?   Cervical back: Normal range of motion and neck supple. No rigidity or tenderness.  ?Skin: ?   General: Skin is warm and dry.  ?   Capillary Refill: Capillary refill takes less than 2 seconds.  ?   Coloration: Skin is not jaundiced or pale.  ?   Findings: No bruising or erythema.  ?Neurological:  ?   General: No focal deficit present.  ?   Mental Status: He is alert and oriented to person, place, and time.  ?Psychiatric:     ?   Mood and Affect: Mood normal.     ?   Behavior: Behavior normal.     ?   Thought Content: Thought content normal.     ?   Judgment: Judgment  normal.  ? ? ?Assessment/Plan: ?66 year old male with PD catheter malfunction.  Discussed with the patient in detail.  Local treatments have been attempted to regain patency of the catheter.  Unfortunately none of them have worked.  Discussed with him in detail about my thought process.  I do think that we will need to revise it.  Procedure discussed with the patient and the family in detail.  Risks, benefits and possible complications including but not limited to: Bleeding, infection injury to abdominal structures.  Catheter malfunction.  We also discussed that sometimes it is catheters will malfunction again due to debris and fibrin clogs. We will do it this Thursday as not to interfere w HD day. ?D/W Dr Candiss Norse in detail ?Please note that I spent more than 40 minutes in this encounter including personally reviewing records, coordinating his care, placing orders and performing appropriate documentation ? ?Caroleen Hamman, MD FACS ?General Surgeon  ?

## 2021-08-14 ENCOUNTER — Other Ambulatory Visit: Payer: Self-pay

## 2021-08-14 ENCOUNTER — Encounter
Admission: RE | Admit: 2021-08-14 | Discharge: 2021-08-14 | Disposition: A | Discharge status: Home / Self Care | Payer: Medicare HMO | Source: Ambulatory Visit | Attending: Surgery | Admitting: Surgery

## 2021-08-14 HISTORY — DX: Other intervertebral disc degeneration, lumbar region without mention of lumbar back pain or lower extremity pain: M51.369

## 2021-08-14 HISTORY — DX: Other intervertebral disc degeneration, lumbar region: M51.36

## 2021-08-14 HISTORY — DX: Vitamin D deficiency, unspecified: E55.9

## 2021-08-14 HISTORY — DX: Benign neoplasm of colon, unspecified: D12.6

## 2021-08-14 NOTE — Patient Instructions (Addendum)
Your procedure is scheduled on: Thursday, March 23 ?Report to the Registration Desk on the 1st floor of the [facility]. ?To find out your arrival time, please call [phone] between 1PM - 3PM on: Wednesday, March 22 ? ?REMEMBER: ?Instructions that are not followed completely may result in serious medical risk, up to and including death; or upon the discretion of your surgeon and anesthesiologist your surgery may need to be rescheduled. ? ?Do not eat or drink after midnight the night before surgery.  ?No gum chewing, lozengers or hard candies. ? ?TAKE THESE MEDICATIONS THE MORNING OF SURGERY WITH A SIP OF WATER: ? ?Amlodipine ?Atorvastatin ?Esomeprazole (Nexium) ?Metoprolol ?Pantoprazole (Protonix) ? ?One week prior to surgery: ?Stop Anti-inflammatories (NSAIDS) such as Advil, Aleve, Ibuprofen, Motrin, Naproxen, Naprosyn and Aspirin based products such as Excedrin, Goodys Powder, BC Powder. ?Stop ANY OVER THE COUNTER supplements until after surgery. ?You may however, continue to take Tylenol if needed for pain up until the day of surgery. ? ?No Alcohol for 24 hours before or after surgery. ? ?No Smoking including e-cigarettes for 24 hours prior to surgery.  ?No chewable tobacco products for at least 6 hours prior to surgery.  ?No nicotine patches on the day of surgery. ? ?Do not use any "recreational" drugs for at least a week prior to your surgery.  ?Please be advised that the combination of cocaine and anesthesia may have negative outcomes, up to and including death. ?If you test positive for cocaine, your surgery will be cancelled. ? ?On the morning of surgery brush your teeth with toothpaste and water, you may rinse your mouth with mouthwash if you wish. ?Do not swallow any toothpaste or mouthwash. ? ?Use CHG Soap or wipes as directed on instruction sheet. ? ?Do not wear jewelry, make-up, hairpins, clips or nail polish. ? ?Do not wear lotions, powders, or perfumes.  ? ?Do not shave body from the neck down  48 hours prior to surgery just in case you cut yourself which could leave a site for infection.  ?Also, freshly shaved skin may become irritated if using the CHG soap. ? ?Contact lenses, hearing aids and dentures may not be worn into surgery. ? ?Do not bring valuables to the hospital. [facility] is not responsible for any missing/lost belongings or valuables.  ? ?Notify your doctor if there is any change in your medical condition (cold, fever, infection). ? ?Wear comfortable clothing (specific to your surgery type) to the hospital. ? ?After surgery, you can help prevent lung complications by doing breathing exercises.  ?Take deep breaths and cough every 1-2 hours. Your doctor may order a device called an Incentive Spirometer to help you take deep breaths. ? ?If you are being discharged the day of surgery, you will not be allowed to drive home. ?You will need a responsible adult (18 years or older) to drive you home and stay with you that night.  ? ?If you are taking public transportation, you will need to have a responsible adult (18 years or older) with you. ?Please confirm with your physician that it is acceptable to use public transportation.  ? ?Please call the [facility] at [phone] if you have any questions about these instructions. ? ?Surgery Visitation Policy: ? ?Patients undergoing a surgery or procedure may have two family members or support persons with them as long as the person is not COVID-19 positive or experiencing its symptoms.  ?

## 2021-08-15 DIAGNOSIS — N186 End stage renal disease: Secondary | ICD-10-CM | POA: Diagnosis not present

## 2021-08-15 DIAGNOSIS — Z992 Dependence on renal dialysis: Secondary | ICD-10-CM | POA: Diagnosis not present

## 2021-08-16 ENCOUNTER — Ambulatory Visit
Admission: RE | Admit: 2021-08-16 | Discharge: 2021-08-16 | Disposition: A | Discharge status: Home / Self Care | Payer: Medicare HMO | Attending: Surgery | Admitting: Surgery

## 2021-08-16 ENCOUNTER — Encounter
Admission: RE | Disposition: A | Discharge status: Home / Self Care | Payer: Self-pay | Source: Home / Self Care | Attending: Surgery

## 2021-08-16 ENCOUNTER — Telehealth: Payer: Self-pay | Admitting: Surgery

## 2021-08-16 ENCOUNTER — Ambulatory Visit: Payer: Medicare HMO | Admitting: Urgent Care

## 2021-08-16 ENCOUNTER — Encounter: Payer: Self-pay | Admitting: Surgery

## 2021-08-16 ENCOUNTER — Other Ambulatory Visit: Payer: Medicare HMO

## 2021-08-16 ENCOUNTER — Other Ambulatory Visit: Payer: Self-pay

## 2021-08-16 DIAGNOSIS — N186 End stage renal disease: Secondary | ICD-10-CM | POA: Insufficient documentation

## 2021-08-16 DIAGNOSIS — T85618A Breakdown (mechanical) of other specified internal prosthetic devices, implants and grafts, initial encounter: Secondary | ICD-10-CM | POA: Diagnosis not present

## 2021-08-16 DIAGNOSIS — Z992 Dependence on renal dialysis: Secondary | ICD-10-CM | POA: Diagnosis not present

## 2021-08-16 DIAGNOSIS — I120 Hypertensive chronic kidney disease with stage 5 chronic kidney disease or end stage renal disease: Secondary | ICD-10-CM | POA: Insufficient documentation

## 2021-08-16 DIAGNOSIS — T85691A Other mechanical complication of intraperitoneal dialysis catheter, initial encounter: Secondary | ICD-10-CM | POA: Diagnosis not present

## 2021-08-16 DIAGNOSIS — K4090 Unilateral inguinal hernia, without obstruction or gangrene, not specified as recurrent: Secondary | ICD-10-CM | POA: Insufficient documentation

## 2021-08-16 DIAGNOSIS — X58XXXA Exposure to other specified factors, initial encounter: Secondary | ICD-10-CM | POA: Diagnosis not present

## 2021-08-16 DIAGNOSIS — D631 Anemia in chronic kidney disease: Secondary | ICD-10-CM | POA: Diagnosis not present

## 2021-08-16 HISTORY — PX: CAPD REVISION: SHX5260

## 2021-08-16 LAB — POCT I-STAT, CHEM 8
BUN: 27 mg/dL — ABNORMAL HIGH (ref 8–23)
Calcium, Ion: 1.12 mmol/L — ABNORMAL LOW (ref 1.15–1.40)
Chloride: 104 mmol/L (ref 98–111)
Creatinine, Ser: 9.5 mg/dL — ABNORMAL HIGH (ref 0.61–1.24)
Glucose, Bld: 100 mg/dL — ABNORMAL HIGH (ref 70–99)
HCT: 25 % — ABNORMAL LOW (ref 39.0–52.0)
Hemoglobin: 8.5 g/dL — ABNORMAL LOW (ref 13.0–17.0)
Potassium: 4.2 mmol/L (ref 3.5–5.1)
Sodium: 137 mmol/L (ref 135–145)
TCO2: 25 mmol/L (ref 22–32)

## 2021-08-16 SURGERY — LAPAROSCOPIC REVISION CONTINUOUS AMBULATORY PERITONEAL DIALYSIS  (CAPD) CATHETER
Anesthesia: General

## 2021-08-16 MED ORDER — CHLORHEXIDINE GLUCONATE CLOTH 2 % EX PADS: 6.0000 | MEDICATED_PAD | Freq: Once | CUTANEOUS | Status: DC

## 2021-08-16 MED ORDER — ONDANSETRON HCL 4 MG/2ML IJ SOLN: 4.0000 mg | Freq: Once | INTRAMUSCULAR | Status: DC | PRN

## 2021-08-16 MED ORDER — LACTATED RINGERS IV SOLN: INTRAVENOUS | Status: DC | PRN

## 2021-08-16 MED ORDER — PHENYLEPHRINE 40 MCG/ML (10ML) SYRINGE FOR IV PUSH (FOR BLOOD PRESSURE SUPPORT)
PREFILLED_SYRINGE | INTRAVENOUS | Status: DC | PRN
  Administered 2021-08-16 (×2): 80 ug via INTRAVENOUS

## 2021-08-16 MED ORDER — BUPIVACAINE LIPOSOME 1.3 % IJ SUSP
INTRAMUSCULAR | Status: DC | PRN
  Administered 2021-08-16: 20 mL

## 2021-08-16 MED ORDER — GABAPENTIN 300 MG PO CAPS
300.0000 mg | ORAL_CAPSULE | ORAL | Status: AC
  Administered 2021-08-16: 300 mg via ORAL

## 2021-08-16 MED ORDER — FENTANYL CITRATE (PF) 100 MCG/2ML IJ SOLN: 25.0000 ug | INTRAMUSCULAR | Status: DC | PRN

## 2021-08-16 MED ORDER — ACETAMINOPHEN 500 MG PO TABS
1000.0000 mg | ORAL_TABLET | ORAL | Status: AC
  Administered 2021-08-16: 1000 mg via ORAL

## 2021-08-16 MED ORDER — CHLORHEXIDINE GLUCONATE 0.12 % MT SOLN
OROMUCOSAL | Status: AC
  Filled 2021-08-16: qty 15

## 2021-08-16 MED ORDER — HEPARIN SODIUM (PORCINE) 10000 UNIT/ML IJ SOLN
INTRAMUSCULAR | Status: DC | PRN
Start: 2021-08-16 — End: 2021-08-16
  Administered 2021-08-16: 10000 [IU] via SUBCUTANEOUS

## 2021-08-16 MED ORDER — BUPIVACAINE-EPINEPHRINE 0.25% -1:200000 IJ SOLN
INTRAMUSCULAR | Status: DC | PRN
  Administered 2021-08-16: 30 mL

## 2021-08-16 MED ORDER — GABAPENTIN 300 MG PO CAPS
ORAL_CAPSULE | ORAL | Status: AC
  Filled 2021-08-16: qty 1

## 2021-08-16 MED ORDER — DEXAMETHASONE SODIUM PHOSPHATE 10 MG/ML IJ SOLN
INTRAMUSCULAR | Status: DC | PRN
  Administered 2021-08-16: 5 mg via INTRAVENOUS

## 2021-08-16 MED ORDER — ONDANSETRON HCL 4 MG/2ML IJ SOLN
INTRAMUSCULAR | Status: DC | PRN
  Administered 2021-08-16: 4 mg via INTRAVENOUS

## 2021-08-16 MED ORDER — OXYCODONE HCL 5 MG PO TABS: 5.0000 mg | ORAL_TABLET | Freq: Once | ORAL | Status: DC | PRN

## 2021-08-16 MED ORDER — SUGAMMADEX SODIUM 200 MG/2ML IV SOLN
INTRAVENOUS | Status: DC | PRN
  Administered 2021-08-16: 100 mg via INTRAVENOUS
  Administered 2021-08-16: 200 mg via INTRAVENOUS

## 2021-08-16 MED ORDER — BUPIVACAINE LIPOSOME 1.3 % IJ SUSP
INTRAMUSCULAR | Status: AC
  Filled 2021-08-16: qty 20

## 2021-08-16 MED ORDER — CLINDAMYCIN PHOSPHATE 900 MG/50ML IV SOLN
INTRAVENOUS | Status: AC
Start: 2021-08-16 — End: 2021-08-16
  Filled 2021-08-16: qty 50

## 2021-08-16 MED ORDER — PHENYLEPHRINE HCL-NACL 20-0.9 MG/250ML-% IV SOLN
INTRAVENOUS | Status: DC | PRN
  Administered 2021-08-16: 35 ug/min via INTRAVENOUS

## 2021-08-16 MED ORDER — DEXAMETHASONE SODIUM PHOSPHATE 10 MG/ML IJ SOLN
INTRAMUSCULAR | Status: AC
  Filled 2021-08-16: qty 1

## 2021-08-16 MED ORDER — CHLORHEXIDINE GLUCONATE 0.12 % MT SOLN
15.0000 mL | Freq: Once | OROMUCOSAL | Status: AC
  Administered 2021-08-16: 15 mL via OROMUCOSAL

## 2021-08-16 MED ORDER — ONDANSETRON HCL 4 MG/2ML IJ SOLN
INTRAMUSCULAR | Status: AC
  Filled 2021-08-16: qty 2

## 2021-08-16 MED ORDER — HEPARIN SODIUM (PORCINE) 10000 UNIT/ML IJ SOLN
INTRAMUSCULAR | Status: AC
  Filled 2021-08-16: qty 1

## 2021-08-16 MED ORDER — FENTANYL CITRATE (PF) 100 MCG/2ML IJ SOLN
INTRAMUSCULAR | Status: DC | PRN
  Administered 2021-08-16 (×3): 25 ug via INTRAVENOUS

## 2021-08-16 MED ORDER — ORAL CARE MOUTH RINSE: 15.0000 mL | Freq: Once | OROMUCOSAL | Status: AC

## 2021-08-16 MED ORDER — ACETAMINOPHEN 10 MG/ML IV SOLN: 1000.0000 mg | Freq: Once | INTRAVENOUS | Status: DC | PRN

## 2021-08-16 MED ORDER — LIDOCAINE HCL (CARDIAC) PF 100 MG/5ML IV SOSY
PREFILLED_SYRINGE | INTRAVENOUS | Status: DC | PRN
  Administered 2021-08-16: 80 mg via INTRAVENOUS

## 2021-08-16 MED ORDER — LACTATED RINGERS IV SOLN: INTRAVENOUS | Status: DC

## 2021-08-16 MED ORDER — CLINDAMYCIN PHOSPHATE 900 MG/50ML IV SOLN
900.0000 mg | INTRAVENOUS | Status: AC
  Administered 2021-08-16: 900 mg via INTRAVENOUS

## 2021-08-16 MED ORDER — ACETAMINOPHEN 500 MG PO TABS
ORAL_TABLET | ORAL | Status: AC
  Filled 2021-08-16: qty 2

## 2021-08-16 MED ORDER — ROCURONIUM BROMIDE 100 MG/10ML IV SOLN
INTRAVENOUS | Status: DC | PRN
  Administered 2021-08-16: 40 mg via INTRAVENOUS

## 2021-08-16 MED ORDER — ROCURONIUM BROMIDE 10 MG/ML (PF) SYRINGE
PREFILLED_SYRINGE | INTRAVENOUS | Status: AC
  Filled 2021-08-16: qty 10

## 2021-08-16 MED ORDER — OXYCODONE HCL 5 MG/5ML PO SOLN: 5.0000 mg | Freq: Once | ORAL | Status: DC | PRN

## 2021-08-16 MED ORDER — FENTANYL CITRATE (PF) 100 MCG/2ML IJ SOLN
INTRAMUSCULAR | Status: AC
  Filled 2021-08-16: qty 2

## 2021-08-16 MED ORDER — HYDROCODONE-ACETAMINOPHEN 5-325 MG PO TABS: 1.0000 | ORAL_TABLET | Freq: Four times a day (QID) | ORAL | 0 refills | Status: DC | PRN

## 2021-08-16 MED ORDER — 0.9 % SODIUM CHLORIDE (POUR BTL) OPTIME
TOPICAL | Status: DC | PRN
  Administered 2021-08-16: 500 mL

## 2021-08-16 MED ORDER — LIDOCAINE HCL (PF) 2 % IJ SOLN
INTRAMUSCULAR | Status: AC
  Filled 2021-08-16: qty 5

## 2021-08-16 MED ORDER — BUPIVACAINE-EPINEPHRINE (PF) 0.25% -1:200000 IJ SOLN
INTRAMUSCULAR | Status: AC
  Filled 2021-08-16: qty 30

## 2021-08-16 MED ORDER — PROPOFOL 10 MG/ML IV BOLUS
INTRAVENOUS | Status: DC | PRN
  Administered 2021-08-16: 140 mg via INTRAVENOUS

## 2021-08-16 MED ORDER — MIDAZOLAM HCL 2 MG/2ML IJ SOLN
INTRAMUSCULAR | Status: AC
  Filled 2021-08-16: qty 2

## 2021-08-16 MED ORDER — SODIUM CHLORIDE 0.9 % IV SOLN: INTRAVENOUS | Status: DC

## 2021-08-16 SURGICAL SUPPLY — 54 items
"PENCIL ELECTRO HAND CTR " (MISCELLANEOUS) ×1 IMPLANT
ADAPTER CATH DIALYSIS 4X8 IT L (MISCELLANEOUS) IMPLANT
ADAPTER TITANIUM MEDIONICS (MISCELLANEOUS) ×1 IMPLANT
ADH SKN CLS APL DERMABOND .7 (GAUZE/BANDAGES/DRESSINGS) ×1
ADPR DLYS CATH STRL LF DISP (MISCELLANEOUS)
BIOPATCH WHT 1IN DISK W/4.0 H (GAUZE/BANDAGES/DRESSINGS) IMPLANT
BLADE CLIPPER SURG (BLADE) ×1 IMPLANT
CATH EXTENDED DIALYSIS (CATHETERS) ×2 IMPLANT
DERMABOND ADVANCED (GAUZE/BANDAGES/DRESSINGS) ×1
DERMABOND ADVANCED .7 DNX12 (GAUZE/BANDAGES/DRESSINGS) ×1 IMPLANT
ELECT CAUTERY BLADE 6.4 (BLADE) ×2 IMPLANT
ELECT REM PT RETURN 9FT ADLT (ELECTROSURGICAL) ×2
ELECTRODE REM PT RTRN 9FT ADLT (ELECTROSURGICAL) ×1 IMPLANT
GLOVE SURG ENC MOIS LTX SZ7 (GLOVE) ×2 IMPLANT
GOWN STRL REUS W/ TWL LRG LVL3 (GOWN DISPOSABLE) ×2 IMPLANT
GOWN STRL REUS W/TWL LRG LVL3 (GOWN DISPOSABLE) ×4
GRASPER SUT TROCAR 14GX15 (MISCELLANEOUS) ×2 IMPLANT
IV NS 1000ML (IV SOLUTION) ×2
IV NS 1000ML BAXH (IV SOLUTION) ×1 IMPLANT
KIT TURNOVER KIT A (KITS) ×2 IMPLANT
MANIFOLD NEPTUNE II (INSTRUMENTS) ×2 IMPLANT
MINICAP W/POVIDONE IODINE SOL (MISCELLANEOUS) ×1 IMPLANT
NDL INSUFFLATION 14GA 120MM (NEEDLE) ×1 IMPLANT
NDL SAFETY ECLIPSE 18X1.5 (NEEDLE) ×1 IMPLANT
NEEDLE HYPO 18GX1.5 SHARP (NEEDLE) ×2
NEEDLE HYPO 22GX1.5 SAFETY (NEEDLE) ×2 IMPLANT
NEEDLE INSUFFLATION 14GA 120MM (NEEDLE) ×2 IMPLANT
NS IRRIG 500ML POUR BTL (IV SOLUTION) ×2 IMPLANT
PACK LAP CHOLECYSTECTOMY (MISCELLANEOUS) ×2 IMPLANT
PENCIL ELECTRO HAND CTR (MISCELLANEOUS) ×2 IMPLANT
SET CYSTO W/LG BORE CLAMP LF (SET/KITS/TRAYS/PACK) ×2 IMPLANT
SET TRANSFER 6 W/TWIST CLAMP 5 (SET/KITS/TRAYS/PACK) ×2 IMPLANT
SET TUBE SMOKE EVAC HIGH FLOW (TUBING) ×2 IMPLANT
SLEEVE ADV FIXATION 5X100MM (TROCAR) ×2 IMPLANT
SPONGE DRAIN TRACH 4X4 STRL 2S (GAUZE/BANDAGES/DRESSINGS) ×2 IMPLANT
SPONGE T-LAP 18X18 ~~LOC~~+RFID (SPONGE) ×2 IMPLANT
STYLET FALLER (MISCELLANEOUS) IMPLANT
STYLET FALLER MEDIONICS (MISCELLANEOUS) IMPLANT
SUT ETHILON 3-0 FS-10 30 BLK (SUTURE) ×2
SUT MNCRL 4-0 (SUTURE) ×2
SUT MNCRL 4-0 27XMFL (SUTURE) ×1
SUT VIC AB 3-0 SH 27 (SUTURE) ×2
SUT VIC AB 3-0 SH 27X BRD (SUTURE) ×1 IMPLANT
SUT VICRYL 0 AB UR-6 (SUTURE) ×4 IMPLANT
SUTURE EHLN 3-0 FS-10 30 BLK (SUTURE) ×1 IMPLANT
SUTURE MNCRL 4-0 27XMF (SUTURE) ×1 IMPLANT
SYR 20ML LL LF (SYRINGE) ×2 IMPLANT
SYR 3ML LL SCALE MARK (SYRINGE) ×2 IMPLANT
SYR TOOMEY IRRIG 70ML (MISCELLANEOUS) ×2
SYRINGE TOOMEY IRRIG 70ML (MISCELLANEOUS) ×1 IMPLANT
SYS KII FIOS ACCESS ABD 5X100 (TROCAR) ×2
SYSTEM KII FIOS ACES ABD 5X100 (TROCAR) ×1 IMPLANT
TROCAR XCEL NON-BLD 5MMX100MML (ENDOMECHANICALS) ×2 IMPLANT
WATER STERILE IRR 500ML POUR (IV SOLUTION) ×1 IMPLANT

## 2021-08-16 NOTE — Anesthesia Procedure Notes (Signed)
Procedure Name: Intubation ?Date/Time: 08/16/2021 9:36 AM ?Performed by: Loletha Grayer, CRNA ?Pre-anesthesia Checklist: Patient identified, Patient being monitored, Timeout performed, Emergency Drugs available and Suction available ?Patient Re-evaluated:Patient Re-evaluated prior to induction ?Oxygen Delivery Method: Circle system utilized ?Preoxygenation: Pre-oxygenation with 100% oxygen ?Induction Type: IV induction ?Ventilation: Mask ventilation without difficulty ?Laryngoscope Size: Mac and 3 ?Grade View: Grade I ?Tube type: Oral ?Tube size: 7.0 mm ?Number of attempts: 1 ?Airway Equipment and Method: Stylet ?Placement Confirmation: ETT inserted through vocal cords under direct vision, positive ETCO2 and breath sounds checked- equal and bilateral ?Secured at: 22 cm ?Tube secured with: Tape ?Dental Injury: Teeth and Oropharynx as per pre-operative assessment  ? ? ? ? ?

## 2021-08-16 NOTE — Addendum Note (Signed)
Addended by: Caroleen Hamman F on: 08/16/2021 11:02 AM ? ? Modules accepted: Orders ? ?

## 2021-08-16 NOTE — Discharge Instructions (Addendum)
Peritoneal Dialysis Catheter Placement, Care After ?The following information offers guidance on how to care for yourself after your procedure. Your health care provider may also give you more specific instructions. If you have problems or questions, contact your health care provider. ?What can I expect after the procedure? ?After the procedure, it is common to have some pain or discomfort in your abdomen and your incision area. ?You may need to wait 2 weeks after your procedure before you can start peritoneal dialysis treatment. If you need dialysis before that time, your health care provider may begin peritoneal dialysis treatment early or offer kidney dialysis treatments (hemodialysis) until you heal. ?Follow these instructions at home: ?Incision care ?Follow instructions from your health care provider about how to take care of your incision or incisions. Make sure you: ?Wash your hands with soap and water for at least 20 seconds before and after you change your bandage (dressing). If soap and water are not available, use hand sanitizer. ?Change your dressing only as told by your health care provider. Your health care provider may tell you not to touch or change your dressing. ?Leave stitches (sutures), staples, skin glue, or adhesive strips in place. These skin closures may need to stay in place for 2 weeks or longer. If adhesive strip edges start to loosen and curl up, you may trim the loose edges. Do not remove adhesive strips completely unless your health care provider tells you to do that. ?Check your incision areas every day for signs of infection. If you were instructed not to touch or change your dressing, look at your dressing for signs of infection. Check for: ?Redness, swelling, or more pain. ?Fluid or blood. ?Warmth. ?Pus or a bad smell. ?Medicines ?Take over-the-counter and prescription medicines only as told by your health care provider. ?If you were prescribed an antibiotic medicine, use it as told  by your health care provider. Do not stop using the antibiotic even if you start to feel better. ?Ask your health care provider if the medicine prescribed to you requires you to avoid driving or using machinery. ?Driving ?Do not drive or ride in a car until your health care provider approves. Your seat belt could move the catheter out of position or cause irritation by rubbing on your incision. ?Activity ?Rest and limit your activity. ?Do not lift anything that is heavier than 10 lb (4.5 kg), or the limit that you are told, until your health care provider says that it is safe. ?Return to your normal activities as told by your health care provider. Ask your health care provider what activities are safe for you. ?Managing constipation ?Your condition may cause constipation. To prevent or treat constipation, you may need to: ?Drink enough fluid to keep your urine pale yellow. ?Take over-the-counter or prescription medicines. ?Eat foods that are high in fiber, such as beans, whole grains, and fresh fruits and vegetables. ?Limit foods that are high in fat and processed sugars, such as fried or sweet foods. ?General instructions ?Do not use any products that contain nicotine or tobacco. These products include cigarettes, chewing tobacco, and vaping devices, such as e-cigarettes. If you need help quitting, ask your health care provider. ?Follow instructions from your health care provider about eating or drinking restrictions. ?Do not take baths, swim, or use a hot tub until your health care provider approves. Ask your health care provider if you may take showers. You may only be allowed to take sponge baths. ?Wear loose-fitting clothing that keeps the catheter  covered so that it cannot get caught on something. ?Keep your catheter clean and dry. ?Keep all follow-up visits. This is important. ?Contact a health care provider if: ?You have a fever or chills. ?You have warmth, redness, swelling, or more pain around an  incision. ?You have fluid or blood coming from an incision. ?You have pus or a bad smell coming from an incision. ?You cannot eat or drink without vomiting. ?Get help right away if: ?You have problems breathing. ?You are confused. ?You have trouble speaking. ?You have severe pain in your abdomen that does not get better with treatment. ?You have bright red blood in your stool (feces), or your stool is dark black and looks like tar. ?These symptoms may represent a serious problem that is an emergency. Do not wait to see if the symptoms will go away. Get medical help right away. Call your local emergency services (911 in the [place] Do not drive yourself to the hospital. ?Summary ?After the procedure, it is common to have some pain or discomfort in your abdomen, your incision area, or both. ?You may have to wait 2 weeks after your procedure before you can start peritoneal dialysis treatment. ?Check your incision area every day for signs of infection. ?Get medical help right away if you have severe pain in your abdomen that does not get better with treatment. ?This information is not intended to replace advice given to you by your health care provider. Make sure you discuss any questions you have with your health care provider. ?Document Revised: 12/30/2019 Document Reviewed: 12/30/2019 ?Elsevier Patient Education ? [place]. ?   ?AMBULATORY SURGERY  ?DISCHARGE INSTRUCTIONS ? ? ?The drugs that you were given will stay in your system until tomorrow so for the next 24 hours you should not: ? ?Drive an automobile ?Make any legal decisions ?Drink any alcoholic beverage ? ? ?You may resume regular meals tomorrow.  Today it is better to start with liquids and gradually work up to solid foods. ? ?You may eat anything you prefer, but it is better to start with liquids, then soup and crackers, and gradually work up to solid foods. ? ? ?Please notify your doctor immediately if you have any unusual bleeding, trouble  breathing, redness and pain at the surgery site, drainage, fever, or pain not relieved by medication. ? ?  ? ?Your post-operative visit with Dr.                     ? ? ?           ?     is: Date:                        Time:   ? ?Please call to schedule your post-operative visit. ? ?Additional Instructions:  ? ? ? ? ? ? ? ? ? ? ? ? ? ? ? ? ?

## 2021-08-16 NOTE — Anesthesia Preprocedure Evaluation (Addendum)
Anesthesia Evaluation  ?Patient identified by MRN, date of birth, ID band ?Patient awake ? ? ? ?Reviewed: ?Allergy & Precautions, NPO status , Patient's Chart, lab work & pertinent test results ? ?History of Anesthesia Complications ?Negative for: history of anesthetic complications ? ?Airway ?Mallampati: III ? ? ?Neck ROM: Full ? ? ? Dental ?no notable dental hx. ? ?  ?Pulmonary ?neg pulmonary ROS,  ?  ?Pulmonary exam normal ?breath sounds clear to auscultation ? ? ? ? ? ? Cardiovascular ?hypertension, Normal cardiovascular exam ?Rhythm:Regular Rate:Normal ? ?ECG 08/11/20: normal ?  ?Neuro/Psych ?Cerebral palsy ?  ? GI/Hepatic ?GERD  ,  ?Endo/Other  ?negative endocrine ROS ? Renal/GU ?ESRF and DialysisRenal disease (last HD 08/15/21)  ? ?  ?Musculoskeletal ? ?(+) Arthritis ,  ? Abdominal ?  ?Peds ? Hematology ? ?(+) Blood dyscrasia, anemia ,   ?Anesthesia Other Findings ? ? Reproductive/Obstetrics ? ?  ? ? ? ? ? ? ? ? ? ? ? ? ? ?  ?  ? ? ? ? ? ? ? ?Anesthesia Physical ?Anesthesia Plan ? ?ASA: 4 ? ?Anesthesia Plan: General  ? ?Post-op Pain Management:   ? ?Induction: Intravenous ? ?PONV Risk Score and Plan: 2 and Ondansetron, Dexamethasone and Treatment may vary due to age or medical condition ? ?Airway Management Planned: Oral ETT ? ?Additional Equipment:  ? ?Intra-op Plan:  ? ?Post-operative Plan: Extubation in OR ? ?Informed Consent: I have reviewed the patients History and Physical, chart, labs and discussed the procedure including the risks, benefits and alternatives for the proposed anesthesia with the patient or authorized representative who has indicated his/her understanding and acceptance.  ? ? ? ?Dental advisory given ? ?Plan Discussed with: CRNA ? ?Anesthesia Plan Comments: (Patient consented for risks of anesthesia including but not limited to:  ?- adverse reactions to medications ?- damage to eyes, teeth, lips or other oral mucosa ?- nerve damage due to positioning  ?-  sore throat or hoarseness ?- damage to heart, brain, nerves, lungs, other parts of body or loss of life ? ?Informed patient about role of CRNA in peri- and intra-operative care.  Patient voiced understanding.)  ? ? ? ? ? ? ?Anesthesia Quick Evaluation ? ?

## 2021-08-16 NOTE — Interval H&P Note (Signed)
History and Physical Interval Note: ? ?08/16/2021 ?9:00 AM ? ?James Black  has presented today for surgery, with the diagnosis of ESRD.  The various methods of treatment have been discussed with the patient and family. After consideration of risks, benefits and other options for treatment, the patient has consented to  Procedure(s): ?LAPAROSCOPIC REVISION CONTINUOUS AMBULATORY PERITONEAL DIALYSIS  (CAPD) CATHETER (N/A) as a surgical intervention.  The patient's history has been reviewed, patient examined, no change in status, stable for surgery.  I have reviewed the patient's chart and labs.  Questions were answered to the patient's satisfaction.   ? ? ?[place] ? ? ?

## 2021-08-16 NOTE — Transfer of Care (Signed)
Immediate Anesthesia Transfer of Care Note ? ?Patient: James Black ? ?Procedure(s) Performed: LAPAROSCOPIC REVISION CONTINUOUS AMBULATORY PERITONEAL DIALYSIS  (CAPD) CATHETER ? ?Patient Location: PACU ? ?Anesthesia Type:General ? ?Level of Consciousness: awake ? ?Airway & Oxygen Therapy: Patient Spontanous Breathing and Patient connected to face mask oxygen ? ?Post-op Assessment: Report given to RN and Post -op Vital signs reviewed and stable ? ?Post vital signs: Reviewed and stable ? ?Last Vitals:  ?Vitals Value Taken Time  ?BP 98/64 08/16/21 1045  ?Temp 36.1 ?C 08/16/21 1045  ?Pulse 72 08/16/21 1049  ?Resp 16 08/16/21 1049  ?SpO2 99 % 08/16/21 1049  ? ? ?Last Pain:  ?Vitals:  ? 08/16/21 1045  ?TempSrc:   ?PainSc: Asleep  ?   ? ?  ? ?Complications: No notable events documented. ?

## 2021-08-16 NOTE — Anesthesia Postprocedure Evaluation (Signed)
Anesthesia Post Note ? ?Patient: James Black ? ?Procedure(s) Performed: LAPAROSCOPIC REVISION CONTINUOUS AMBULATORY PERITONEAL DIALYSIS  (CAPD) CATHETER ? ?Patient location during evaluation: PACU ?Anesthesia Type: General ?Level of consciousness: awake and alert, oriented and patient cooperative ?Pain management: pain level controlled ?Vital Signs Assessment: post-procedure vital signs reviewed and stable ?Respiratory status: spontaneous breathing, nonlabored ventilation and respiratory function stable ?Cardiovascular status: blood pressure returned to baseline and stable ?Postop Assessment: adequate PO intake ?Anesthetic complications: no ? ? ?No notable events documented. ? ? ?Last Vitals:  ?Vitals:  ? 08/16/21 1115 08/16/21 1128  ?BP: 100/66 114/75  ?Pulse: 73 74  ?Resp: 14 18  ?Temp: (!) 36.1 ?C 36.7 ?C  ?SpO2: 93% 100%  ?  ?Last Pain:  ?Vitals:  ? 08/16/21 1128  ?TempSrc: Tympanic  ?PainSc: 0-No pain  ? ? ?  ?  ?  ?  ?  ?  ? ?Darrin Nipper ? ? ? ? ?

## 2021-08-16 NOTE — Telephone Encounter (Signed)
Outgoing call is made, left message for niece, Tammy to call. Please inform of the following regarding scheduled surgery for inguinal hernia repair:  ? ?Pre-Admission date/time, COVID Testing date and Surgery date. ? ?Surgery Date: 08/23/21 ?Preadmission Testing Date: 08/20/21 (phone 8a-1p) ?Covid Testing Date: Not needed.     ? ?Also they will need to call at [phone], between 1-3:00pm the day before surgery, to find out what time to arrive for surgery.   ? ?

## 2021-08-16 NOTE — Op Note (Addendum)
Laparoscopic Revision of peritoneal Dialysis catheter Laparoscopic Omentopexy ?  ?Pre-operative Diagnosis: ESRD, PD catheter malfunction ?  ?Post-operative Diagnosis: same ?  ?  ?Surgeon: Caroleen Hamman, MD FACS ?  ?Anesthesia: Gen. with endotracheal tube ?  ?   ?Findings: ?Fibrin plug within cathetert ?Right inguinal hernia ?Catheter within pelvis ?Good return after infusing Peritoneal cavity ? ?  ?Estimated Blood Loss: 5cc ?       ?      ?Complications: none ?  ?  ?Procedure Details  ?The patient was seen again in the Holding Room. The benefits, complications, treatment options, and expected outcomes were discussed with the patient. The risks of bleeding, infection, recurrence of symptoms, failure to resolve symptoms, catheter malfunction bowel injury, any of which could require further surgery were reviewed with the patient. The likelihood of improving the patient's symptoms with return to their baseline status is good.  The patient and/or family concurred with the proposed plan, giving informed consent.  The patient was taken to Operating Room, identified and the procedure verified. A Time Out was held and the above information confirmed. ?  ?Prior to the induction of general anesthesia, antibiotic prophylaxis was administered. VTE prophylaxis was in place. General endotracheal anesthesia was then administered and tolerated well. After the induction, the abdomen was prepped with Chloraprep and draped in the sterile fashion. The patient was positioned in the supine position. ?Small incision created on Palmer's [point and veres needle placed. Saline test and pressures were appropriate.  ?Pneumoperitoneum was obtained w/o hemodynamic changes. We placed one additional laparoscopic port under direct visualization. ? ?No injuries were visualized. ?THe catheter was exteriorize and inspected. This was flush and a small fibrin plug was milked, we flushed the catheter and it was widely patent. Diagnostic laparoscopy revealed  a Left likely indirect inguinal hernia ?  ?There was no evidence of any kinks.  We instilled heparinized saline a liter into the pelvis and we had very good return. ?He did have generous omentum,  and the prior omentopexy became undone.  ?Attention was then turned to the omentum and using a PMI device we were able to perform an omentopexy and tacked the omentum to the abdominal wall using 2 interrupted 2-0 Vicryl sutures in the standard fashion. ? ?All skin incisions  were infiltrated with a liposomal Marcaine. ?4-0 subcuticular Monocryl was used to close the skin. Dermabond was  applied. Sterile dressing applied to the catheter. The patient was then extubated and brought to the recovery room in stable condition. Sponge, lap, and needle counts were correct at closure and at the conclusion of the case.  ?Liposomal marcaine mixed was injected on all incision. ?        ?I do think that Left inguinal hernia is also interfering with Peritoneal dialysis as some flush fluid went to the hernia sac. GIven that I did not have consent for this I did not repair it, I also utilize a robotic approach and we were not set for this at this time. Family and pt updated . ?     ?Caroleen Hamman, MD, FACS ?  ?   ?

## 2021-08-17 DIAGNOSIS — Z992 Dependence on renal dialysis: Secondary | ICD-10-CM | POA: Diagnosis not present

## 2021-08-17 DIAGNOSIS — N186 End stage renal disease: Secondary | ICD-10-CM | POA: Diagnosis not present

## 2021-08-20 ENCOUNTER — Encounter: Payer: Medicare HMO | Admitting: Surgery

## 2021-08-20 ENCOUNTER — Other Ambulatory Visit: Payer: Self-pay

## 2021-08-20 ENCOUNTER — Encounter
Admission: RE | Admit: 2021-08-20 | Discharge: 2021-08-20 | Disposition: A | Discharge status: Home / Self Care | Payer: Medicare HMO | Source: Ambulatory Visit | Attending: Surgery | Admitting: Surgery

## 2021-08-20 ENCOUNTER — Encounter: Payer: Self-pay | Admitting: Surgery

## 2021-08-20 ENCOUNTER — Ambulatory Visit (INDEPENDENT_AMBULATORY_CARE_PROVIDER_SITE_OTHER): Payer: Medicare HMO | Admitting: Surgery

## 2021-08-20 VITALS — BP 178/100 | HR 125 | Temp 99.2°F | Ht 66.0 in | Wt 172.2 lb

## 2021-08-20 DIAGNOSIS — K4090 Unilateral inguinal hernia, without obstruction or gangrene, not specified as recurrent: Secondary | ICD-10-CM

## 2021-08-20 DIAGNOSIS — N186 End stage renal disease: Secondary | ICD-10-CM | POA: Diagnosis not present

## 2021-08-20 DIAGNOSIS — Z992 Dependence on renal dialysis: Secondary | ICD-10-CM | POA: Diagnosis not present

## 2021-08-20 HISTORY — DX: Anemia, unspecified: D64.9

## 2021-08-20 HISTORY — DX: Nausea with vomiting, unspecified: R11.2

## 2021-08-20 HISTORY — DX: Tremor, unspecified: R25.1

## 2021-08-20 HISTORY — DX: Other specified postprocedural states: Z98.890

## 2021-08-20 NOTE — Patient Instructions (Signed)
Your procedure is scheduled on:08-23-21 Thursday ?Report to the Registration Desk on the 1st floor of the [facility].Then proceed to the 2nd floor Surgery Desk in the [facility] ?To find out your arrival time, please call [phone] between 1PM - 3PM on:08-22-21 Wednesday ? ?REMEMBER: ?Instructions that are not followed completely may result in serious medical risk, up to and including death; or upon the discretion of your surgeon and anesthesiologist your surgery may need to be rescheduled. ? ?Do not eat food after midnight the night before surgery.  ?No gum chewing, lozengers or hard candies. ? ?You may however, drink CLEAR liquids up to 2 hours before you are scheduled to arrive for your surgery. Do not drink anything within 2 hours of your scheduled arrival time. ? ?Clear liquids include: ?- water  ?- apple juice without pulp ?- gatorade (not RED colors) ?- black coffee or tea (Do NOT add milk or creamers to the coffee or tea) ?Do NOT drink anything that is not on this list. ? ?TAKE THESE MEDICATIONS THE MORNING OF SURGERY WITH A SIP OF WATER: ?-amLODipine (NORVASC)  ?-atorvastatin (LIPITOR) ?-esomeprazole ([place])  ?-metoprolol tartrate (LOPRESSOR)  ?-pantoprazole (PROTONIX)  ? ?One week prior to surgery: ?Stop Anti-inflammatories (NSAIDS) such as Advil, Aleve, Ibuprofen, Motrin, Naproxen, Naprosyn and Aspirin based products such as Excedrin, Goodys Powder, BC Powder.You may however, take Tylenol if needed for pain up until the day of surgery. ? ?Stop ANY OVER THE COUNTER supplements until after surgery. ? ? ?No Alcohol for 24 hours before or after surgery. ? ?No Smoking including e-cigarettes for 24 hours prior to surgery.  ?No chewable tobacco products for at least 6 hours prior to surgery.  ?No nicotine patches on the day of surgery. ? ?Do not use any "recreational" drugs for at least a week prior to your surgery.  ?Please be advised that the combination of cocaine and anesthesia may have negative  outcomes, up to and including death. ?If you test positive for cocaine, your surgery will be cancelled. ? ?On the morning of surgery brush your teeth with toothpaste and water, you may rinse your mouth with mouthwash if you wish. ?Do not swallow any toothpaste or mouthwash. ? ?Do not wear jewelry, make-up, hairpins, clips or nail polish. ? ?Do not wear lotions, powders, or perfumes.  ? ?Do not shave body from the neck down 48 hours prior to surgery just in case you cut yourself which could leave a site for infection.  ?Also, freshly shaved skin may become irritated if using the CHG soap. ? ?Contact lenses, hearing aids and dentures may not be worn into surgery. ? ?Do not bring valuables to the hospital. [facility] is not responsible for any missing/lost belongings or valuables.  ? ?Notify your doctor if there is any change in your medical condition (cold, fever, infection). ? ?Wear comfortable clothing (specific to your surgery type) to the hospital. ? ?After surgery, you can help prevent lung complications by doing breathing exercises.  ?Take deep breaths and cough every 1-2 hours. Your doctor may order a device called an Incentive Spirometer to help you take deep breaths. ?When coughing or sneezing, hold a pillow firmly against your incision with both hands. This is called ?splinting.? Doing this helps protect your incision. It also decreases belly discomfort. ? ?If you are being admitted to the hospital overnight, leave your suitcase in the car. ?After surgery it may be brought to your room. ? ?If you are being discharged the day of surgery,  you will not be allowed to drive home. ?You will need a responsible adult (18 years or older) to drive you home and stay with you that night.  ? ?If you are taking public transportation, you will need to have a responsible adult (18 years or older) with you. ?Please confirm with your physician that it is acceptable to use public transportation.  ? ?Please call the  [facility] at [phone] if you have any questions about these instructions. ? ?Surgery Visitation Policy: ? ?Patients undergoing a surgery or procedure may have one family members or support persons with them as long as the person is not COVID-19 positive or experiencing its symptoms.  ?

## 2021-08-20 NOTE — Patient Instructions (Addendum)
Please call the office with any questions or concerns.  ? ? ?Inguinal Hernia, Adult ?An inguinal hernia develops when fat or the intestines push through a weak spot in a muscle where the leg meets the lower abdomen (groin). This creates a bulge. This kind of hernia could also be: ?In the scrotum, if you are male. ?In folds of skin around the vagina, if you are male. ?There are three types of inguinal hernias: ?Hernias that can be pushed back into the abdomen (are reducible). This type rarely causes pain. ?Hernias that are not reducible (are incarcerated). ?Hernias that are not reducible and lose their blood supply (are strangulated). This type of hernia requires emergency surgery. ?What are the causes? ?This condition is caused by having a weak spot in the muscles or tissues in your groin. This develops over time. The hernia may poke through the weak spot when you suddenly strain your lower abdominal muscles, such as when you: ?Lift a heavy object. ?Strain to have a bowel movement. Constipation can lead to straining. ?Cough. ?What increases the risk? ?This condition is more likely to develop in: ?Males. ?Pregnant females. ?People who: ?Are overweight. ?Work in jobs that require long periods of standing or heavy lifting. ?Have had an inguinal hernia before. ?Smoke or have lung disease. These factors can lead to long-term (chronic) coughing. ?What are the signs or symptoms? ?Symptoms may depend on the size of the hernia. Often, a small inguinal hernia has no symptoms. Symptoms of a larger hernia may include: ?A bulge in the groin area. This is easier to see when standing. It might not be visible when lying down. ?Pain or burning in the groin. This may get worse when lifting, straining, or coughing. ?A dull ache or a feeling of pressure in the groin. ?An unusual bulge in the scrotum, in males. ?Symptoms of a strangulated inguinal hernia may include: ?A bulge in your groin that is very painful and tender to the  touch. ?A bulge that turns red or purple. ?Fever, nausea, and vomiting. ?Inability to have a bowel movement or to pass gas. ?How is this diagnosed? ?This condition is diagnosed based on your symptoms, your medical history, and a physical exam. Your health care provider may feel your groin area and ask you to cough. ?How is this treated? ?Treatment depends on the size of your hernia and whether you have symptoms. If you do not have symptoms, your health care provider may have you watch your hernia carefully and have you come in for follow-up visits. If your hernia is large or if you have symptoms, you may need surgery to repair the hernia. ?Follow these instructions at home: ?Lifestyle ?Avoid lifting heavy objects. ?Avoid standing for long periods of time. ?Do not use any products that contain nicotine or tobacco. These products include cigarettes, chewing tobacco, and vaping devices, such as e-cigarettes. If you need help quitting, ask your health care provider. ?Maintain a healthy weight. ?Preventing constipation ?You may need to take these actions to prevent or treat constipation: ?Drink enough fluid to keep your urine pale yellow. ?Take over-the-counter or prescription medicines. ?Eat foods that are high in fiber, such as beans, whole grains, and fresh fruits and vegetables. ?Limit foods that are high in fat and processed sugars, such as fried or sweet foods. ?General instructions ?You may try to push the hernia back in place by very gently pressing on it while lying down. Do not try to force the bulge back in if it will not push  in easily. ?Watch your hernia for any changes in shape, size, or color. Get help right away if you notice any changes. ?Take over-the-counter and prescription medicines only as told by your health care provider. ?Keep all follow-up visits. This is important. ?Contact a health care provider if: ?You have a fever or chills. ?You develop new symptoms. ?Your symptoms get worse. ?Get help  right away if: ?You have pain in your groin that suddenly gets worse. ?You have a bulge in your groin that: ?Suddenly gets bigger and does not get smaller. ?Becomes red or purple or painful to the touch. ?You are a man and you have a sudden pain in your scrotum, or the size of your scrotum suddenly changes. ?You cannot push the hernia back in place by very gently pressing on it when you are lying down. ?You have nausea or vomiting that does not go away. ?You have a fast heartbeat. ?You cannot have a bowel movement or pass gas. ?These symptoms may represent a serious problem that is an emergency. Do not wait to see if the symptoms will go away. Get medical help right away. Call your local emergency services (911 in the [place] ?Summary ?An inguinal hernia develops when fat or the intestines push through a weak spot in a muscle where your leg meets your lower abdomen (groin). ?This condition is caused by having a weak spot in muscles or tissues in your groin. ?Symptoms may depend on the size of the hernia, and they may include pain or swelling in your groin. A small inguinal hernia often has no symptoms. ?Treatment may not be needed if you do not have symptoms. If you have symptoms or a large hernia, you may need surgery to repair the hernia. ?Avoid lifting heavy objects. Also, avoid standing for long periods of time. ?This information is not intended to replace advice given to you by your health care provider. Make sure you discuss any questions you have with your health care provider. ?Document Revised: 01/11/2020 Document Reviewed: 01/11/2020 ?Elsevier Patient Education ? [place]. ? ?

## 2021-08-20 NOTE — Progress Notes (Signed)
Outpatient Surgical Follow Up ? ?08/20/2021 ? ?James Black is an 66 y.o. male.  ? ?Chief Complaint  ?Patient presents with  ? Routine Post Op  ? ? ?HPI: James Black is a 66 year old male with end-stage renal disease hemodialysis he is trying to pursue PD catheter dialysis.  I revised his catheter last week and also finding a right inguinal hernia with actually fluid: Inside the sac therefore interfering catheter..  I discussed with the family in detail about operative findings.  I did feel strongly that we will need to repair the inguinal hernia.  ?He is on hemodialysis Monday Wednesday and Fridays ? ?Past Medical History:  ?Diagnosis Date  ? Anemia   ? Arthritis   ? Cerebral palsy ([place])   ? DDD (degenerative disc disease), lumbar   ? Deficiency of vitamin B12   ? Encephalitis   ? ESRD (end stage renal disease) on dialysis [facility])   ? M-W-F  ? GERD (gastroesophageal reflux disease)   ? Hemorrhoid   ? Hypercholesterolemia   ? Hyperkalemia   ? Hypertension   ? PONV (postoperative nausea and vomiting)   ? n/v after 08-16-21 capd revision  ? Tremor   ? Tubular adenoma of colon   ? Vitamin D deficiency   ? ? ?Past Surgical History:  ?Procedure Laterality Date  ? CAPD INSERTION N/A 08/10/2020  ? Procedure: LAPAROSCOPIC INSERTION CONTINUOUS AMBULATORY PERITONEAL DIALYSIS  (CAPD) CATHETER;  Surgeon: Jules Husbands, MD;  Location: [facility] ORS;  Service: General;  Laterality: N/A;  ? CAPD REVISION N/A 08/16/2021  ? Procedure: LAPAROSCOPIC REVISION CONTINUOUS AMBULATORY PERITONEAL DIALYSIS  (CAPD) CATHETER;  Surgeon: Jules Husbands, MD;  Location: [facility] ORS;  Service: General;  Laterality: N/A;  ? COLONOSCOPY    ? 2003, 2011  ? COLONOSCOPY WITH PROPOFOL N/A 05/25/2015  ? Procedure: COLONOSCOPY WITH PROPOFOL;  Surgeon: Manya Silvas, MD;  Location: [facility] ENDOSCOPY;  Service: Endoscopy;  Laterality: N/A;  ? COLONOSCOPY WITH PROPOFOL N/A 05/12/2020  ? Procedure: COLONOSCOPY WITH PROPOFOL;  Surgeon: Lesly Rubenstein, MD;  Location: [facility]  ENDOSCOPY;  Service: Endoscopy;  Laterality: N/A;  ? ESOPHAGOGASTRODUODENOSCOPY    ? 2003, 2011  ? ESOPHAGOGASTRODUODENOSCOPY (EGD) WITH PROPOFOL N/A 05/12/2020  ? Procedure: ESOPHAGOGASTRODUODENOSCOPY (EGD) WITH PROPOFOL;  Surgeon: Lesly Rubenstein, MD;  Location: [facility] ENDOSCOPY;  Service: Endoscopy;  Laterality: N/A;  ? FRACTURE SURGERY Left 1997  ? arm  ? ? ?History reviewed. No pertinent family history. ? ?Social History:  reports that he has never smoked. He has never used smokeless tobacco. He reports that he does not drink alcohol and does not use drugs. ? ?Allergies:  ?Allergies  ?Allergen Reactions  ? Cefuroxime Rash  ? Tramadol Nausea Only and Other (See Comments)  ?  Dizziness  ? ? ?Medications reviewed. ? ? ? ?ROS ?Full ROS performed and is otherwise negative other than what is stated in HPI ? ? ?BP (!) 178/100   Pulse (!) 125   Temp 99.2 ?F (37.3 ?C) (Oral)   Ht '5\' 6"'$  (1.676 m)   Wt 172 lb 3.2 oz (78.1 kg)   SpO2 95%   BMI 27.79 kg/m?  ? ?Physical Exam ?Vitals and nursing note reviewed. Exam conducted with a chaperone present.  ?Constitutional:   ?   Appearance: Normal appearance. He is normal weight.  ?Eyes:  ?   General: No scleral icterus.    ?   Right eye: No discharge.     ?   Left eye: No discharge.  ?  Cardiovascular:  ?   Rate and Rhythm: Normal rate.  ?   Heart sounds: No murmur heard. ?Pulmonary:  ?   Effort: Pulmonary effort is normal. No respiratory distress.  ?   Breath sounds: Normal breath sounds. No stridor.  ?Abdominal:  ?   General: Abdomen is flat. There is no distension.  ?   Palpations: There is no mass.  ?   Tenderness: There is no abdominal tenderness. There is no guarding.  ?   Hernia: A hernia is present.  ?   Comments: Small reducible right inguinal hernia.  PD catheter is in place.  Rest of the incisions healing well without infection.  ?Musculoskeletal:     ?   General: Normal range of motion.  ?   Cervical back: Normal range of motion and neck supple. No rigidity.   ?Skin: ?   General: Skin is warm and dry.  ?Neurological:  ?   Mental Status: He is alert.  ?Psychiatric:     ?   Mood and Affect: Mood normal.     ?   Behavior: Behavior normal.     ?   Thought Content: Thought content normal.     ?   Judgment: Judgment normal.  ? ? ? ?Assessment/Plan: ?66 year old chronic issues regarding PD catheter.  I would advise him last week but found an inguinal hernia that is interfering with dialysis. ?Discussed with the patient in detail about the operative findings.  Given that he does have a right inguinal hernia definitely needs to be repair for the PA catheter to be functioning appropriately.  Procedure discussed with the patient and the sister in detail.  Risks, benefits and possible applications including but not limited to: Bleeding, infection injury to adjacent structures.  He understands and wished to proceed.  Please note that I spent at least 30 minutes in this encounter including coordination of his care, placing orders counseling the patient and performing appropriate documentation ?Please also note that this encounter was mainly for inguinal hernia that is separate from the prior revision of PD catheter ? ?Caroleen Hamman, MD FACS ?General Surgeon  ?

## 2021-08-20 NOTE — H&P (View-Only) (Signed)
Outpatient Surgical Follow Up ? ?08/20/2021 ? ?James Black is an 66 y.o. male.  ? ?Chief Complaint  ?Patient presents with  ? Routine Post Op  ? ? ?HPI: James Black is a 66 year old male with end-stage renal disease hemodialysis he is trying to pursue PD catheter dialysis.  I revised his catheter last week and also finding a right inguinal hernia with actually fluid: Inside the sac therefore interfering catheter..  I discussed with the family in detail about operative findings.  I did feel strongly that we will need to repair the inguinal hernia.  ?He is on hemodialysis Monday Wednesday and Fridays ? ?Past Medical History:  ?Diagnosis Date  ? Anemia   ? Arthritis   ? Cerebral palsy ([place])   ? DDD (degenerative disc disease), lumbar   ? Deficiency of vitamin B12   ? Encephalitis   ? ESRD (end stage renal disease) on dialysis [facility])   ? M-W-F  ? GERD (gastroesophageal reflux disease)   ? Hemorrhoid   ? Hypercholesterolemia   ? Hyperkalemia   ? Hypertension   ? PONV (postoperative nausea and vomiting)   ? n/v after 08-16-21 capd revision  ? Tremor   ? Tubular adenoma of colon   ? Vitamin D deficiency   ? ? ?Past Surgical History:  ?Procedure Laterality Date  ? CAPD INSERTION N/A 08/10/2020  ? Procedure: LAPAROSCOPIC INSERTION CONTINUOUS AMBULATORY PERITONEAL DIALYSIS  (CAPD) CATHETER;  Surgeon: Jules Husbands, MD;  Location: [facility] ORS;  Service: General;  Laterality: N/A;  ? CAPD REVISION N/A 08/16/2021  ? Procedure: LAPAROSCOPIC REVISION CONTINUOUS AMBULATORY PERITONEAL DIALYSIS  (CAPD) CATHETER;  Surgeon: Jules Husbands, MD;  Location: [facility] ORS;  Service: General;  Laterality: N/A;  ? COLONOSCOPY    ? 2003, 2011  ? COLONOSCOPY WITH PROPOFOL N/A 05/25/2015  ? Procedure: COLONOSCOPY WITH PROPOFOL;  Surgeon: Manya Silvas, MD;  Location: [facility] ENDOSCOPY;  Service: Endoscopy;  Laterality: N/A;  ? COLONOSCOPY WITH PROPOFOL N/A 05/12/2020  ? Procedure: COLONOSCOPY WITH PROPOFOL;  Surgeon: Lesly Rubenstein, MD;  Location: [facility]  ENDOSCOPY;  Service: Endoscopy;  Laterality: N/A;  ? ESOPHAGOGASTRODUODENOSCOPY    ? 2003, 2011  ? ESOPHAGOGASTRODUODENOSCOPY (EGD) WITH PROPOFOL N/A 05/12/2020  ? Procedure: ESOPHAGOGASTRODUODENOSCOPY (EGD) WITH PROPOFOL;  Surgeon: Lesly Rubenstein, MD;  Location: [facility] ENDOSCOPY;  Service: Endoscopy;  Laterality: N/A;  ? FRACTURE SURGERY Left 1997  ? arm  ? ? ?History reviewed. No pertinent family history. ? ?Social History:  reports that he has never smoked. He has never used smokeless tobacco. He reports that he does not drink alcohol and does not use drugs. ? ?Allergies:  ?Allergies  ?Allergen Reactions  ? Cefuroxime Rash  ? Tramadol Nausea Only and Other (See Comments)  ?  Dizziness  ? ? ?Medications reviewed. ? ? ? ?ROS ?Full ROS performed and is otherwise negative other than what is stated in HPI ? ? ?BP (!) 178/100   Pulse (!) 125   Temp 99.2 ?F (37.3 ?C) (Oral)   Ht '5\' 6"'$  (1.676 m)   Wt 172 lb 3.2 oz (78.1 kg)   SpO2 95%   BMI 27.79 kg/m?  ? ?Physical Exam ?Vitals and nursing note reviewed. Exam conducted with a chaperone present.  ?Constitutional:   ?   Appearance: Normal appearance. He is normal weight.  ?Eyes:  ?   General: No scleral icterus.    ?   Right eye: No discharge.     ?   Left eye: No discharge.  ?  Cardiovascular:  ?   Rate and Rhythm: Normal rate.  ?   Heart sounds: No murmur heard. ?Pulmonary:  ?   Effort: Pulmonary effort is normal. No respiratory distress.  ?   Breath sounds: Normal breath sounds. No stridor.  ?Abdominal:  ?   General: Abdomen is flat. There is no distension.  ?   Palpations: There is no mass.  ?   Tenderness: There is no abdominal tenderness. There is no guarding.  ?   Hernia: A hernia is present.  ?   Comments: Small reducible right inguinal hernia.  PD catheter is in place.  Rest of the incisions healing well without infection.  ?Musculoskeletal:     ?   General: Normal range of motion.  ?   Cervical back: Normal range of motion and neck supple. No rigidity.   ?Skin: ?   General: Skin is warm and dry.  ?Neurological:  ?   Mental Status: He is alert.  ?Psychiatric:     ?   Mood and Affect: Mood normal.     ?   Behavior: Behavior normal.     ?   Thought Content: Thought content normal.     ?   Judgment: Judgment normal.  ? ? ? ?Assessment/Plan: ?66 year old chronic issues regarding PD catheter.  I would advise him last week but found an inguinal hernia that is interfering with dialysis. ?Discussed with the patient in detail about the operative findings.  Given that he does have a right inguinal hernia definitely needs to be repair for the PA catheter to be functioning appropriately.  Procedure discussed with the patient and the sister in detail.  Risks, benefits and possible applications including but not limited to: Bleeding, infection injury to adjacent structures.  He understands and wished to proceed.  Please note that I spent at least 30 minutes in this encounter including coordination of his care, placing orders counseling the patient and performing appropriate documentation ?Please also note that this encounter was mainly for inguinal hernia that is separate from the prior revision of PD catheter ? ?Caroleen Hamman, MD FACS ?General Surgeon  ?

## 2021-08-21 ENCOUNTER — Ambulatory Visit: Payer: Medicare HMO

## 2021-08-22 DIAGNOSIS — Z992 Dependence on renal dialysis: Secondary | ICD-10-CM | POA: Diagnosis not present

## 2021-08-22 DIAGNOSIS — N186 End stage renal disease: Secondary | ICD-10-CM | POA: Diagnosis not present

## 2021-08-22 MED ORDER — CHLORHEXIDINE GLUCONATE 0.12 % MT SOLN: 15.0000 mL | Freq: Once | OROMUCOSAL | Status: AC

## 2021-08-22 MED ORDER — SODIUM CHLORIDE 0.9 % IV SOLN: INTRAVENOUS | Status: DC

## 2021-08-22 MED ORDER — CHLORHEXIDINE GLUCONATE CLOTH 2 % EX PADS: 6.0000 | MEDICATED_PAD | Freq: Once | CUTANEOUS | Status: DC

## 2021-08-22 MED ORDER — CLINDAMYCIN PHOSPHATE 900 MG/50ML IV SOLN: 900.0000 mg | INTRAVENOUS | Status: DC

## 2021-08-22 MED ORDER — ORAL CARE MOUTH RINSE: 15.0000 mL | Freq: Once | OROMUCOSAL | Status: AC

## 2021-08-22 MED ORDER — GABAPENTIN 300 MG PO CAPS: 300.0000 mg | ORAL_CAPSULE | ORAL | Status: AC

## 2021-08-22 MED ORDER — APREPITANT 40 MG PO CAPS: 40.0000 mg | ORAL_CAPSULE | Freq: Once | ORAL | Status: AC

## 2021-08-22 MED ORDER — CELECOXIB 200 MG PO CAPS: 200.0000 mg | ORAL_CAPSULE | ORAL | Status: AC

## 2021-08-22 MED ORDER — CHLORHEXIDINE GLUCONATE CLOTH 2 % EX PADS
6.0000 | MEDICATED_PAD | Freq: Once | CUTANEOUS | Status: AC
  Administered 2021-08-23: 6 via TOPICAL

## 2021-08-22 MED ORDER — ACETAMINOPHEN 500 MG PO TABS: 1000.0000 mg | ORAL_TABLET | ORAL | Status: AC

## 2021-08-23 ENCOUNTER — Other Ambulatory Visit: Payer: Self-pay

## 2021-08-23 ENCOUNTER — Ambulatory Visit
Admission: RE | Admit: 2021-08-23 | Discharge: 2021-08-23 | Disposition: A | Discharge status: Home / Self Care | Payer: Medicare HMO | Attending: Surgery | Admitting: Surgery

## 2021-08-23 ENCOUNTER — Encounter: Payer: Self-pay | Admitting: Surgery

## 2021-08-23 ENCOUNTER — Ambulatory Visit: Payer: Medicare HMO | Admitting: Urgent Care

## 2021-08-23 ENCOUNTER — Encounter
Admission: RE | Disposition: A | Discharge status: Home / Self Care | Payer: Self-pay | Source: Home / Self Care | Attending: Surgery

## 2021-08-23 DIAGNOSIS — N186 End stage renal disease: Secondary | ICD-10-CM | POA: Diagnosis not present

## 2021-08-23 DIAGNOSIS — Z992 Dependence on renal dialysis: Secondary | ICD-10-CM | POA: Insufficient documentation

## 2021-08-23 DIAGNOSIS — I120 Hypertensive chronic kidney disease with stage 5 chronic kidney disease or end stage renal disease: Secondary | ICD-10-CM | POA: Diagnosis not present

## 2021-08-23 DIAGNOSIS — E7800 Pure hypercholesterolemia, unspecified: Secondary | ICD-10-CM | POA: Diagnosis not present

## 2021-08-23 DIAGNOSIS — K4090 Unilateral inguinal hernia, without obstruction or gangrene, not specified as recurrent: Secondary | ICD-10-CM | POA: Diagnosis not present

## 2021-08-23 HISTORY — DX: End stage renal disease: Z99.2

## 2021-08-23 HISTORY — PX: XI ROBOTIC ASSISTED INGUINAL HERNIA REPAIR WITH MESH: SHX6706

## 2021-08-23 HISTORY — DX: Dependence on renal dialysis: N18.6

## 2021-08-23 LAB — POCT I-STAT, CHEM 8
BUN: 21 mg/dL (ref 8–23)
Calcium, Ion: 1.05 mmol/L — ABNORMAL LOW (ref 1.15–1.40)
Chloride: 102 mmol/L (ref 98–111)
Creatinine, Ser: 7 mg/dL — ABNORMAL HIGH (ref 0.61–1.24)
Glucose, Bld: 99 mg/dL (ref 70–99)
HCT: 24 % — ABNORMAL LOW (ref 39.0–52.0)
Hemoglobin: 8.2 g/dL — ABNORMAL LOW (ref 13.0–17.0)
Potassium: 3.7 mmol/L (ref 3.5–5.1)
Sodium: 137 mmol/L (ref 135–145)
TCO2: 23 mmol/L (ref 22–32)

## 2021-08-23 SURGERY — REPAIR, HERNIA, INGUINAL, ROBOT-ASSISTED, LAPAROSCOPIC, USING MESH
Anesthesia: General | Laterality: Right

## 2021-08-23 MED ORDER — ONDANSETRON HCL 4 MG/2ML IJ SOLN: 4.0000 mg | Freq: Once | INTRAMUSCULAR | Status: DC | PRN

## 2021-08-23 MED ORDER — GLYCOPYRROLATE 0.2 MG/ML IJ SOLN
INTRAMUSCULAR | Status: DC | PRN
  Administered 2021-08-23: .2 mg via INTRAVENOUS

## 2021-08-23 MED ORDER — FENTANYL CITRATE (PF) 100 MCG/2ML IJ SOLN
INTRAMUSCULAR | Status: DC | PRN
  Administered 2021-08-23 (×2): 50 ug via INTRAVENOUS

## 2021-08-23 MED ORDER — APREPITANT 40 MG PO CAPS
ORAL_CAPSULE | ORAL | Status: AC
  Administered 2021-08-23: 40 mg via ORAL
  Filled 2021-08-23: qty 1

## 2021-08-23 MED ORDER — CELECOXIB 200 MG PO CAPS
ORAL_CAPSULE | ORAL | Status: AC
  Administered 2021-08-23: 200 mg via ORAL
  Filled 2021-08-23: qty 1

## 2021-08-23 MED ORDER — ACETAMINOPHEN 500 MG PO TABS
ORAL_TABLET | ORAL | Status: AC
Start: 2021-08-23 — End: 2021-08-23
  Administered 2021-08-23: 1000 mg via ORAL
  Filled 2021-08-23: qty 2

## 2021-08-23 MED ORDER — MIDAZOLAM HCL 2 MG/2ML IJ SOLN
INTRAMUSCULAR | Status: DC | PRN
  Administered 2021-08-23: 2 mg via INTRAVENOUS

## 2021-08-23 MED ORDER — HYDROCODONE-ACETAMINOPHEN 5-325 MG PO TABS: 1.0000 | ORAL_TABLET | Freq: Four times a day (QID) | ORAL | 0 refills | Status: DC | PRN

## 2021-08-23 MED ORDER — SUGAMMADEX SODIUM 200 MG/2ML IV SOLN
INTRAVENOUS | Status: DC | PRN
  Administered 2021-08-23 (×2): 200 mg via INTRAVENOUS

## 2021-08-23 MED ORDER — PROPOFOL 500 MG/50ML IV EMUL
INTRAVENOUS | Status: AC
  Filled 2021-08-23: qty 50

## 2021-08-23 MED ORDER — CHLORHEXIDINE GLUCONATE 0.12 % MT SOLN
OROMUCOSAL | Status: AC
  Administered 2021-08-23: 15 mL via OROMUCOSAL
  Filled 2021-08-23: qty 15

## 2021-08-23 MED ORDER — 0.9 % SODIUM CHLORIDE (POUR BTL) OPTIME
TOPICAL | Status: DC | PRN
  Administered 2021-08-23: 100 mL

## 2021-08-23 MED ORDER — CEFAZOLIN SODIUM-DEXTROSE 2-3 GM-%(50ML) IV SOLR
INTRAVENOUS | Status: DC | PRN
  Administered 2021-08-23: 2 g via INTRAVENOUS

## 2021-08-23 MED ORDER — PROPOFOL 10 MG/ML IV BOLUS
INTRAVENOUS | Status: DC | PRN
  Administered 2021-08-23: 100 mg via INTRAVENOUS

## 2021-08-23 MED ORDER — MIDAZOLAM HCL 2 MG/2ML IJ SOLN
INTRAMUSCULAR | Status: AC
  Filled 2021-08-23: qty 2

## 2021-08-23 MED ORDER — PROPOFOL 10 MG/ML IV BOLUS
INTRAVENOUS | Status: AC
  Filled 2021-08-23: qty 20

## 2021-08-23 MED ORDER — ROCURONIUM BROMIDE 100 MG/10ML IV SOLN
INTRAVENOUS | Status: DC | PRN
  Administered 2021-08-23: 40 mg via INTRAVENOUS

## 2021-08-23 MED ORDER — CEFAZOLIN SODIUM-DEXTROSE 2-4 GM/100ML-% IV SOLN
INTRAVENOUS | Status: AC
  Filled 2021-08-23: qty 100

## 2021-08-23 MED ORDER — BUPIVACAINE-EPINEPHRINE 0.25% -1:200000 IJ SOLN
INTRAMUSCULAR | Status: DC | PRN
  Administered 2021-08-23: 50 mL

## 2021-08-23 MED ORDER — BUPIVACAINE LIPOSOME 1.3 % IJ SUSP
INTRAMUSCULAR | Status: AC
  Filled 2021-08-23: qty 20

## 2021-08-23 MED ORDER — EPHEDRINE SULFATE (PRESSORS) 50 MG/ML IJ SOLN
INTRAMUSCULAR | Status: DC | PRN
  Administered 2021-08-23 (×3): 5 mg via INTRAVENOUS

## 2021-08-23 MED ORDER — OXYCODONE HCL 5 MG PO TABS: 5.0000 mg | ORAL_TABLET | Freq: Once | ORAL | Status: DC | PRN

## 2021-08-23 MED ORDER — PHENYLEPHRINE 40 MCG/ML (10ML) SYRINGE FOR IV PUSH (FOR BLOOD PRESSURE SUPPORT)
PREFILLED_SYRINGE | INTRAVENOUS | Status: DC | PRN
  Administered 2021-08-23: 40 ug via INTRAVENOUS
  Administered 2021-08-23: 80 ug via INTRAVENOUS

## 2021-08-23 MED ORDER — ONDANSETRON HCL 4 MG/2ML IJ SOLN
INTRAMUSCULAR | Status: DC | PRN
  Administered 2021-08-23 (×2): 4 mg via INTRAVENOUS

## 2021-08-23 MED ORDER — FENTANYL CITRATE (PF) 100 MCG/2ML IJ SOLN
INTRAMUSCULAR | Status: AC
  Filled 2021-08-23: qty 2

## 2021-08-23 MED ORDER — DEXAMETHASONE SODIUM PHOSPHATE 10 MG/ML IJ SOLN
INTRAMUSCULAR | Status: DC | PRN
  Administered 2021-08-23: 5 mg via INTRAVENOUS

## 2021-08-23 MED ORDER — BUPIVACAINE-EPINEPHRINE (PF) 0.25% -1:200000 IJ SOLN
INTRAMUSCULAR | Status: AC
Start: 2021-08-23 — End: ?
  Filled 2021-08-23: qty 30

## 2021-08-23 MED ORDER — OXYCODONE HCL 5 MG/5ML PO SOLN: 5.0000 mg | Freq: Once | ORAL | Status: DC | PRN

## 2021-08-23 MED ORDER — CLINDAMYCIN PHOSPHATE 900 MG/50ML IV SOLN
INTRAVENOUS | Status: AC
  Filled 2021-08-23: qty 50

## 2021-08-23 MED ORDER — GABAPENTIN 300 MG PO CAPS
ORAL_CAPSULE | ORAL | Status: AC
  Administered 2021-08-23: 300 mg via ORAL
  Filled 2021-08-23: qty 1

## 2021-08-23 MED ORDER — LIDOCAINE HCL (CARDIAC) PF 100 MG/5ML IV SOSY
PREFILLED_SYRINGE | INTRAVENOUS | Status: DC | PRN
Start: 2021-08-23 — End: 2021-08-23
  Administered 2021-08-23: 80 mg via INTRAVENOUS

## 2021-08-23 MED ORDER — FENTANYL CITRATE (PF) 100 MCG/2ML IJ SOLN: 25.0000 ug | INTRAMUSCULAR | Status: DC | PRN

## 2021-08-23 SURGICAL SUPPLY — 53 items
ADH SKN CLS APL DERMABOND .7 (GAUZE/BANDAGES/DRESSINGS) ×1
CANNULA REDUC XI 12-8 STAPL (CANNULA) ×1
CANNULA REDUCER 12-8 DVNC XI (CANNULA) ×1 IMPLANT
COVER TIP SHEARS 8 DVNC (MISCELLANEOUS) ×1 IMPLANT
COVER TIP SHEARS 8MM DA VINCI (MISCELLANEOUS) ×1
COVER WAND RF STERILE (DRAPES) ×2 IMPLANT
DERMABOND ADVANCED (GAUZE/BANDAGES/DRESSINGS) ×1
DERMABOND ADVANCED .7 DNX12 (GAUZE/BANDAGES/DRESSINGS) ×1 IMPLANT
DRAPE ARM DVNC X/XI (DISPOSABLE) ×3 IMPLANT
DRAPE COLUMN DVNC XI (DISPOSABLE) ×1 IMPLANT
DRAPE DA VINCI XI ARM (DISPOSABLE) ×3
DRAPE DA VINCI XI COLUMN (DISPOSABLE) ×1
ELECT CAUTERY BLADE 6.4 (BLADE) ×2 IMPLANT
ELECT REM PT RETURN 9FT ADLT (ELECTROSURGICAL) ×2
ELECTRODE REM PT RTRN 9FT ADLT (ELECTROSURGICAL) ×1 IMPLANT
GLOVE SURG ENC MOIS LTX SZ7 (GLOVE) ×8 IMPLANT
GOWN STRL REUS W/ TWL LRG LVL3 (GOWN DISPOSABLE) ×4 IMPLANT
GOWN STRL REUS W/TWL LRG LVL3 (GOWN DISPOSABLE) ×8
KIT PINK PAD W/HEAD ARE REST (MISCELLANEOUS) ×2
KIT PINK PAD W/HEAD ARM REST (MISCELLANEOUS) ×1 IMPLANT
LABEL OR SOLS (LABEL) ×2 IMPLANT
MANIFOLD NEPTUNE II (INSTRUMENTS) ×2 IMPLANT
MESH 3DMAX 3X5 LT MED (Mesh General) IMPLANT
MESH 3DMAX 3X5 RT MED (Mesh General) IMPLANT
MESH 3DMAX 4X6 LT LRG (Mesh General) IMPLANT
MESH 3DMAX 4X6 RT LRG (Mesh General) ×1 IMPLANT
NDL INSUFFLATION 14GA 120MM (NEEDLE) IMPLANT
NEEDLE HYPO 22GX1.5 SAFETY (NEEDLE) ×2 IMPLANT
NEEDLE INSUFFLATION 14GA 120MM (NEEDLE) ×2 IMPLANT
OBTURATOR OPTICAL STANDARD 8MM (TROCAR) ×1
OBTURATOR OPTICAL STND 8 DVNC (TROCAR) ×1
OBTURATOR OPTICALSTD 8 DVNC (TROCAR) ×1 IMPLANT
PACK LAP CHOLECYSTECTOMY (MISCELLANEOUS) ×2 IMPLANT
PENCIL ELECTRO HAND CTR (MISCELLANEOUS) ×2 IMPLANT
SEAL CANN UNIV 5-8 DVNC XI (MISCELLANEOUS) ×3 IMPLANT
SEAL XI 5MM-8MM UNIVERSAL (MISCELLANEOUS) ×3
SET TUBE SMOKE EVAC HIGH FLOW (TUBING) ×2 IMPLANT
SOLUTION ELECTROLUBE (MISCELLANEOUS) ×2 IMPLANT
SPONGE DRAIN TRACH 4X4 STRL 2S (GAUZE/BANDAGES/DRESSINGS) ×1 IMPLANT
SPONGE T-LAP 18X18 ~~LOC~~+RFID (SPONGE) ×2 IMPLANT
STAPLER CANNULA SEAL DVNC XI (STAPLE) ×1 IMPLANT
STAPLER CANNULA SEAL XI (STAPLE) ×1
SUT MNCRL AB 4-0 PS2 18 (SUTURE) ×3 IMPLANT
SUT V-LOC 90 ABS 3-0 VLT  V-20 (SUTURE) ×2
SUT V-LOC 90 ABS 3-0 VLT V-20 (SUTURE) ×2 IMPLANT
SUT VIC AB 2-0 SH 27 (SUTURE) ×4
SUT VIC AB 2-0 SH 27XBRD (SUTURE) ×2 IMPLANT
SUT VICRYL 0 AB UR-6 (SUTURE) ×4 IMPLANT
SYR 20ML LL LF (SYRINGE) ×2 IMPLANT
SYR 30ML LL (SYRINGE) ×2 IMPLANT
TAPE TRANSPORE STRL 2 31045 (GAUZE/BANDAGES/DRESSINGS) ×2 IMPLANT
TROCAR BALLN GELPORT 12X130M (ENDOMECHANICALS) ×2 IMPLANT
WATER STERILE IRR 500ML POUR (IV SOLUTION) ×2 IMPLANT

## 2021-08-23 NOTE — OR Nursing (Signed)
Per OR6 staff, per Dr. Dahlia Byes, patient can be discharged to home without void.  (Bladder scan 94).  Patient/family member aware to call MD later if unable to void at home. ?

## 2021-08-23 NOTE — Op Note (Signed)
Robotic assisted Laparoscopic Transabdominal Right Inguinal Hernia Repair with 3 D large Mesh ?  ?  ?Pre-operative Diagnosis:  Right Inguinal Hernia ?  ?Post-operative Diagnosis: Same ?  ?Procedure: Robotic  Laparoscopic  repair of Right inguinal hernia ?  ?Surgeon: Caroleen Hamman, MD FACS ?  ?Anesthesia: Gen. with endotracheal tube ?  ?Findings: Right direct inguinal hernia, widely patent PD catheter       ?  ?Procedure Details  ?The patient was seen again in the Holding Room. The benefits, complications, treatment options, and expected outcomes were discussed with the patient. The risks of bleeding, infection, recurrence of symptoms, failure to resolve symptoms, recurrence of hernia, ischemic orchitis, chronic pain syndrome or neuroma, were discussed again. The likelihood of improving the patient's symptoms with return to their baseline status is good.  The patient and/or family concurred with the proposed plan, giving informed consent.  The patient was taken to Operating Room, identified  and the procedure verified as Laparoscopic Inguinal Hernia Repair. Laterality confirmed. ? A Time Out was held and the above information confirmed. ?  ?Prior to the induction of general anesthesia, antibiotic prophylaxis was administered. VTE prophylaxis was in place. General endotracheal anesthesia was then administered and tolerated well. After the induction, the abdomen was prepped with Chloraprep and draped in the sterile fashion. The patient was positioned in the supine position. ?  ?  ?Veres needle used RUQ and pneumoperitoneum obtained. Without HD changes No evidence of bowel injuries.  ?Three 8 mm placed under direct vision. The laparoscopy revealed Right direct indirect defect. I inserted the needle and the mesh. ?The robot was brought ot the table and docked in the standard fashion, no collision between arms was observed. Instruments were kept under direct view at all times. ?We started on the right side were a flap was  created. The sac was reduced and dissected free from adjacent structures. We preserved the vas and the vessels. Once dissection was completed a large 3D mesh was placed and secured with two interrupted vicryl attached to the pubic tubercle. ?There was good coverage of the direct, indirect and femoral spaces. ?The flap was closed with v lock suture. ?Second look revealed no complications or injuries and good patency and position of the PD catheter.  ?  ?Once assuring that hemostasis was adequate the ports were removed and 4-0 subcuticular Monocryl was used at all skin edges. Dermabond was placed. Liposomal marcaine injected on all incisions. ?Patient tolerated the procedure well. There were no complications. He was taken to the recovery room in stable condition.  ?  ?  ?   ?  ?      ?Caroleen Hamman, MD, FACS ?   ?

## 2021-08-23 NOTE — Transfer of Care (Signed)
Immediate Anesthesia Transfer of Care Note ? ?Patient: James Black ? ?Procedure(s) Performed: XI ROBOTIC ASSISTED INGUINAL HERNIA REPAIR WITH MESH (Right) ? ?Patient Location: PACU ? ?Anesthesia Type:General ? ?Level of Consciousness: drowsy and patient cooperative ? ?Airway & Oxygen Therapy: Patient Spontanous Breathing and Patient connected to face mask oxygen ? ?Post-op Assessment: Report given to RN and Post -op Vital signs reviewed and stable ? ?Post vital signs: Reviewed and stable ? ?Last Vitals:  ?Vitals Value Taken Time  ?BP    ?Temp    ?Pulse    ?Resp    ?SpO2    ? ? ?Last Pain:  ?Vitals:  ? 08/23/21 1518  ?TempSrc: Temporal  ?PainSc: 0-No pain  ?   ? ?  ? ?Complications: No notable events documented. ?

## 2021-08-23 NOTE — Anesthesia Procedure Notes (Signed)
Procedure Name: Intubation ?Date/Time: 08/23/2021 7:36 AM ?Performed by: Kelton Pillar, CRNA ?Pre-anesthesia Checklist: Patient identified, Emergency Drugs available, Suction available and Patient being monitored ?Patient Re-evaluated:Patient Re-evaluated prior to induction ?Oxygen Delivery Method: Circle system utilized ?Preoxygenation: Pre-oxygenation with 100% oxygen ?Induction Type: IV induction ?Ventilation: Mask ventilation without difficulty ?Laryngoscope Size: McGraph and 3 ?Grade View: Grade I ?Tube type: Oral ?Tube size: 7.0 mm ?Number of attempts: 1 ?Airway Equipment and Method: Stylet and Oral airway ?Placement Confirmation: ETT inserted through vocal cords under direct vision, positive ETCO2, breath sounds checked- equal and bilateral and CO2 detector ?Secured at: 21 cm ?Tube secured with: Tape ?Dental Injury: Teeth and Oropharynx as per pre-operative assessment  ? ? ? ? ?

## 2021-08-23 NOTE — Anesthesia Postprocedure Evaluation (Signed)
Anesthesia Post Note ? ?Patient: James Black ? ?Procedure(s) Performed: XI ROBOTIC ASSISTED INGUINAL HERNIA REPAIR WITH MESH (Right) ? ?Patient location during evaluation: PACU ?Anesthesia Type: General ?Level of consciousness: awake and alert ?Pain management: pain level controlled ?Vital Signs Assessment: post-procedure vital signs reviewed and stable ?Respiratory status: spontaneous breathing, nonlabored ventilation, respiratory function stable and patient connected to nasal cannula oxygen ?Cardiovascular status: blood pressure returned to baseline and stable ?Postop Assessment: no apparent nausea or vomiting ?Anesthetic complications: no ? ? ?No notable events documented. ? ? ?Last Vitals:  ?Vitals:  ? 08/23/21 1012 08/23/21 1113  ?BP: 124/81 132/82  ?Pulse: 96 92  ?Resp: 16 18  ?Temp: (!) 36.3 ?C (!) 36.1 ?C  ?SpO2: 100% 99%  ?  ?Last Pain:  ?Vitals:  ? 08/23/21 1113  ?TempSrc: Temporal  ?PainSc: 0-No pain  ? ? ?  ?  ?  ?  ?  ?  ? ?Arita Miss ? ? ? ? ?

## 2021-08-23 NOTE — Anesthesia Preprocedure Evaluation (Signed)
Anesthesia Evaluation  ?Patient identified by MRN, date of birth, ID band ?Patient awake ? ? ? ?Reviewed: ?Allergy & Precautions, NPO status , Patient's Chart, lab work & pertinent test results ? ?History of Anesthesia Complications ?Negative for: history of anesthetic complications ? ?Airway ?Mallampati: III ? ?TM Distance: >3 FB ?Neck ROM: Full ? ? ? Dental ?no notable dental hx. ?(+) Teeth Intact ?  ?Pulmonary ?neg pulmonary ROS, neg sleep apnea, neg COPD, Patient abstained from smoking.Not current smoker,  ?  ?Pulmonary exam normal ?breath sounds clear to auscultation ? ? ? ? ? ? Cardiovascular ?Exercise Tolerance: Good ?METShypertension, Pt. on medications ?(-) CAD and (-) Past MI Normal cardiovascular exam(-) dysrhythmias  ?Rhythm:Regular Rate:Normal ? ?ECG 08/11/20: normal ?  ?Neuro/Psych ?Cerebral palsy ?negative neurological ROS ? negative psych ROS  ? GI/Hepatic ?GERD  Controlled and Medicated,(+)  ?  ? (-) substance abuse ? ,   ?Endo/Other  ?negative endocrine ROSneg diabetes ? Renal/GU ?ESRF and DialysisRenal disease (last HD 08/15/21)Last PD 08/22/21. Feels well today, not fluid overloaded or SOB  ? ?  ?Musculoskeletal ? ?(+) Arthritis ,  ? Abdominal ?  ?Peds ? Hematology ? ?(+) Blood dyscrasia, anemia ,   ?Anesthesia Other Findings ?Past Medical History: ?No date: Anemia ?No date: Arthritis ?No date: Cerebral palsy ([place]) ?No date: DDD (degenerative disc disease), lumbar ?No date: Deficiency of vitamin B12 ?No date: Encephalitis ?No date: ESRD (end stage renal disease) on dialysis [facility]) ?    Comment:  M-W-F ?No date: GERD (gastroesophageal reflux disease) ?No date: Hemorrhoid ?No date: Hypercholesterolemia ?No date: Hyperkalemia ?No date: Hypertension ?No date: PONV (postoperative nausea and vomiting) ?    Comment:  n/v after 08-16-21 capd revision ?No date: Tremor ?No date: Tubular adenoma of colon ?No date: Vitamin D deficiency ? Reproductive/Obstetrics ? ?   ? ? ? ? ? ? ? ? ? ? ? ? ? ?  ?  ? ? ? ? ? ? ? ? ?Anesthesia Physical ? ?Anesthesia Plan ? ?ASA: 4 ? ?Anesthesia Plan: General  ? ?Post-op Pain Management: Tylenol PO (pre-op)* and Gabapentin PO (pre-op)*  ? ?Induction: Intravenous ? ?PONV Risk Score and Plan: 4 or greater and Ondansetron, Dexamethasone, Treatment may vary due to age or medical condition and Midazolam ? ?Airway Management Planned: Oral ETT ? ?Additional Equipment: None ? ?Intra-op Plan:  ? ?Post-operative Plan: Extubation in OR ? ?Informed Consent: I have reviewed the patients History and Physical, chart, labs and discussed the procedure including the risks, benefits and alternatives for the proposed anesthesia with the patient or authorized representative who has indicated his/her understanding and acceptance.  ? ? ? ?Dental advisory given ? ?Plan Discussed with: CRNA ? ?Anesthesia Plan Comments: (Discussed risks of anesthesia with patient, including PONV, sore throat, lip/dental/eye damage. Rare risks discussed as well, such as cardiorespiratory and neurological sequelae, and allergic reactions. Discussed the role of CRNA in patient's perioperative care. Patient understands.)  ? ? ? ? ? ? ?Anesthesia Quick Evaluation ? ?

## 2021-08-23 NOTE — Discharge Instructions (Addendum)
Laparoscopic Inguinal Hernia Repair, Adult, Care After The following information offers guidance on how to care for yourself after your procedure. Your health care provider may also give you more specific instructions. If you have problems or questions, contact your health care provider. What can I expect after the procedure? After the procedure, it is common to have: Pain. Swelling and bruising around the incision area. Scrotal swelling, in males. Some fluid or blood draining from your incisions. Follow these instructions at home: Medicines Take over-the-counter and prescription medicines only as told by your health care provider. Ask your health care provider if the medicine prescribed to you: Requires you to avoid driving or using machinery. Can cause constipation. You may need to take these actions to prevent or treat constipation: Drink enough fluid to keep your urine pale yellow. Take over-the-counter or prescription medicines. Eat foods that are high in fiber, such as beans, whole grains, and fresh fruits and vegetables. Limit foods that are high in fat and processed sugars, such as fried or sweet foods. Incision care  Follow instructions from your health care provider about how to take care of your incisions. Make sure you: Wash your hands with soap and water for at least 20 seconds before and after you change your bandage (dressing). If soap and water are not available, use hand sanitizer. Change your dressing as told by your health care provider. Leave stitches (sutures), skin glue, or adhesive strips in place. These skin closures may need to stay in place for 2 weeks or longer. If adhesive strip edges start to loosen and curl up, you may trim the loose edges. Do not remove adhesive strips completely unless your health care provider tells you to do that. Check your incision area every day for signs of infection. Check for: More redness, swelling, or pain. More fluid or  blood. Warmth. Pus or a bad smell. Wear loose, soft clothing while your incisions heal. Managing pain and swelling  If directed, put ice on the painful or swollen areas. To do this: Put ice in a plastic bag. Place a towel between your skin and the bag. Leave the ice on for 20 minutes, 2-3 times a day. Remove the ice if your skin turns bright red. This is very important. If you cannot feel pain, heat, or cold, you have a greater risk of damage to the area.   Activity Do not lift anything that is heavier than 10 lb (4.5 kg), or the limit that you are told, until your health care provider says that it is safe. Ask your health care provider what activities are safe for you. A lot of activity during the first week after surgery can increase pain and swelling. For 1 week after your procedure: Avoid activities that take a lot of effort, such as exercise or sports. You may walk and climb stairs as needed for daily activity, but avoid long walks or climbing stairs for exercise. General instructions If you were given a sedative during the procedure, it can affect you for several hours. Do not drive or operate machinery until your health care provider says that it is safe. Do not take baths, swim, or use a hot tub until your health care provider approves. Ask your health care provider if you may take showers. You may only be allowed to take sponge baths. Do not use any products that contain nicotine or tobacco. These products include cigarettes, chewing tobacco, and vaping devices, such as e-cigarettes. If you need help quitting, ask   care provider. ?Keep all follow-up visits. This is important. ?Contact a health care provider if: ?You have any of these signs of infection: ?More redness, swelling, or pain around your incisions or your groin area. ?More fluid or blood coming from an incision. ?Warmth coming from an incision. ?Pus or a bad smell coming from an incision. ?A fever or chills. ?You have  more swelling in your scrotum, if you are male. ?You have severe pain and medicines do not help. ?You have abdominal pain or swelling. ?You cannot urinate or have a bowel movement. ?You faint or feel dizzy. ?You have nausea and vomiting. ?Get help right away if: ?You have redness, warmth, or pain in your leg. ?You have chest pain. ?You have problems breathing. ?These symptoms may represent a serious problem that is an emergency. Do not wait to see if the symptoms will go away. Get medical help right away. Call your local emergency services (911 in the [place] Do not drive yourself to the hospital. ?Summary ?Pain, swelling, and bruising are common after the procedure. ?Check your incision area every day for signs of infection, such as more redness, swelling, or pain. ?Put ice on painful or swollen areas for 20 minutes, 2-3 times a day. ?This information is not intended to replace advice given to you by your health care provider. Make sure you discuss any questions you have with your health care provider. ?Document Revised: 01/11/2020 Document Reviewed: 01/11/2020 ?Elsevier Patient Education ? [place]. ?   ? ?AMBULATORY SURGERY  ?DISCHARGE INSTRUCTIONS ? ? ?The drugs that you were given will stay in your system until tomorrow so for the next 24 hours you should not: ? ?Drive an automobile ?Make any legal decisions ?Drink any alcoholic beverage ? ? ?You may resume regular meals tomorrow.  Today it is better to start with liquids and gradually work up to solid foods. ? ?You may eat anything you prefer, but it is better to start with liquids, then soup and crackers, and gradually work up to solid foods. ? ? ?Please notify your doctor immediately if you have any unusual bleeding, trouble breathing, redness and pain at the surgery site, drainage, fever, or pain not relieved by medication. ? ? ? ?Additional Instructions: ? ? ? ? ? ? ? ?Please contact your physician with any problems or Same Day Surgery at  [phone], Monday through Friday 6 am to 4 pm, or [place] at [facility] number at [phone].  ?

## 2021-08-23 NOTE — Interval H&P Note (Signed)
History and Physical Interval Note: ? ?08/23/2021 ?7:21 AM ? ?James Black  has presented today for surgery, with the diagnosis of right inguinal hernia.  The various methods of treatment have been discussed with the patient and family. After consideration of risks, benefits and other options for treatment, the patient has consented to  Procedure(s): ?XI ROBOTIC [place] (Right) as a surgical intervention.  The patient's history has been reviewed, patient examined, no change in status, stable for surgery.  I have reviewed the patient's chart and labs.  Questions were answered to the patient's satisfaction.   ? ? ?[place] ? ? ?

## 2021-08-24 DIAGNOSIS — N186 End stage renal disease: Secondary | ICD-10-CM | POA: Diagnosis not present

## 2021-08-24 DIAGNOSIS — Z992 Dependence on renal dialysis: Secondary | ICD-10-CM | POA: Diagnosis not present

## 2021-08-26 DIAGNOSIS — N186 End stage renal disease: Secondary | ICD-10-CM | POA: Diagnosis not present

## 2021-08-26 DIAGNOSIS — Z992 Dependence on renal dialysis: Secondary | ICD-10-CM | POA: Diagnosis not present

## 2021-08-27 DIAGNOSIS — E8770 Fluid overload, unspecified: Secondary | ICD-10-CM | POA: Diagnosis not present

## 2021-08-27 DIAGNOSIS — N186 End stage renal disease: Secondary | ICD-10-CM | POA: Diagnosis not present

## 2021-08-27 DIAGNOSIS — Z992 Dependence on renal dialysis: Secondary | ICD-10-CM | POA: Diagnosis not present

## 2021-08-28 DIAGNOSIS — Z992 Dependence on renal dialysis: Secondary | ICD-10-CM | POA: Diagnosis not present

## 2021-08-28 DIAGNOSIS — N186 End stage renal disease: Secondary | ICD-10-CM | POA: Diagnosis not present

## 2021-08-29 DIAGNOSIS — Z992 Dependence on renal dialysis: Secondary | ICD-10-CM | POA: Diagnosis not present

## 2021-08-29 DIAGNOSIS — E8770 Fluid overload, unspecified: Secondary | ICD-10-CM | POA: Diagnosis not present

## 2021-08-29 DIAGNOSIS — N186 End stage renal disease: Secondary | ICD-10-CM | POA: Diagnosis not present

## 2021-08-30 DIAGNOSIS — N186 End stage renal disease: Secondary | ICD-10-CM | POA: Diagnosis not present

## 2021-08-30 DIAGNOSIS — Z992 Dependence on renal dialysis: Secondary | ICD-10-CM | POA: Diagnosis not present

## 2021-08-31 DIAGNOSIS — E8770 Fluid overload, unspecified: Secondary | ICD-10-CM | POA: Diagnosis not present

## 2021-08-31 DIAGNOSIS — Z992 Dependence on renal dialysis: Secondary | ICD-10-CM | POA: Diagnosis not present

## 2021-08-31 DIAGNOSIS — N186 End stage renal disease: Secondary | ICD-10-CM | POA: Diagnosis not present

## 2021-09-01 DIAGNOSIS — N186 End stage renal disease: Secondary | ICD-10-CM | POA: Diagnosis not present

## 2021-09-01 DIAGNOSIS — Z992 Dependence on renal dialysis: Secondary | ICD-10-CM | POA: Diagnosis not present

## 2021-09-02 DIAGNOSIS — N186 End stage renal disease: Secondary | ICD-10-CM | POA: Diagnosis not present

## 2021-09-02 DIAGNOSIS — Z992 Dependence on renal dialysis: Secondary | ICD-10-CM | POA: Diagnosis not present

## 2021-09-03 DIAGNOSIS — Z992 Dependence on renal dialysis: Secondary | ICD-10-CM | POA: Diagnosis not present

## 2021-09-03 DIAGNOSIS — N186 End stage renal disease: Secondary | ICD-10-CM | POA: Diagnosis not present

## 2021-09-03 DIAGNOSIS — E8770 Fluid overload, unspecified: Secondary | ICD-10-CM | POA: Diagnosis not present

## 2021-09-04 ENCOUNTER — Ambulatory Visit (INDEPENDENT_AMBULATORY_CARE_PROVIDER_SITE_OTHER): Payer: Medicare HMO | Admitting: Physician Assistant

## 2021-09-04 ENCOUNTER — Encounter: Payer: Self-pay | Admitting: Physician Assistant

## 2021-09-04 VITALS — BP 160/80 | HR 92 | Temp 98.3°F | Ht 66.0 in | Wt 166.0 lb

## 2021-09-04 DIAGNOSIS — Z09 Encounter for follow-up examination after completed treatment for conditions other than malignant neoplasm: Secondary | ICD-10-CM

## 2021-09-04 DIAGNOSIS — Z992 Dependence on renal dialysis: Secondary | ICD-10-CM | POA: Diagnosis not present

## 2021-09-04 DIAGNOSIS — N186 End stage renal disease: Secondary | ICD-10-CM | POA: Diagnosis not present

## 2021-09-04 DIAGNOSIS — K4090 Unilateral inguinal hernia, without obstruction or gangrene, not specified as recurrent: Secondary | ICD-10-CM

## 2021-09-04 NOTE — Progress Notes (Signed)
[facility] ?POST-OP OFFICE VISIT ? ?09/04/2021 ? ?HPI: ?James Black is a 66 y.o. male 12 days s/p robotic assisted laparoscopic right inguinal hernia repair with Dr Dahlia Byes.  ? ?He is doing extremely well ?No complaints ?No fever, chills, nausea, emesis, abdominal pain, or bowel changes ?No issues with PO intake ?No issues with incisions  ? ?Vital signs: ?BP (!) 160/80   Pulse 92   Temp 98.3 ?F (36.8 ?C)   Ht '5\' 6"'$  (1.676 m)   Wt 166 lb (75.3 kg)   SpO2 99%   BMI 26.79 kg/m?   ? ?Physical Exam: ?Constitutional: Well appearing male, NAD ?Abdomen: Soft, non-tender, non-distended, no rebound/guarding. PD catheter in left abdomen ?Chest: Temporary HD catheter in right chest wall  ?Skin: Laparoscopic incisions are healing well, no erythema or drainage  ? ?Assessment/Plan: ?This is a 66 y.o. male 12 days s/p robotic assisted laparoscopic right inguinal hernia repair  ? ? - Okay to resume PD  ? - Pain control prn ? - Reviewed wound care recommendation ? - Reviewed lifting restrictions; 6 weeks total ? - He can follow up on as needed basis; He understands to call with questions/concerns ? ?-- ?Edison Simon, PA-C ?[facility] ?09/04/2021, 2:18 PM ?M-F: 7am - 4pm ? ?

## 2021-09-04 NOTE — Patient Instructions (Signed)

## 2021-09-05 DIAGNOSIS — Z992 Dependence on renal dialysis: Secondary | ICD-10-CM | POA: Diagnosis not present

## 2021-09-05 DIAGNOSIS — N186 End stage renal disease: Secondary | ICD-10-CM | POA: Diagnosis not present

## 2021-09-05 DIAGNOSIS — E8770 Fluid overload, unspecified: Secondary | ICD-10-CM | POA: Diagnosis not present

## 2021-09-06 DIAGNOSIS — N186 End stage renal disease: Secondary | ICD-10-CM | POA: Diagnosis not present

## 2021-09-06 DIAGNOSIS — Z992 Dependence on renal dialysis: Secondary | ICD-10-CM | POA: Diagnosis not present

## 2021-09-07 DIAGNOSIS — E8770 Fluid overload, unspecified: Secondary | ICD-10-CM | POA: Diagnosis not present

## 2021-09-07 DIAGNOSIS — N186 End stage renal disease: Secondary | ICD-10-CM | POA: Diagnosis not present

## 2021-09-07 DIAGNOSIS — Z992 Dependence on renal dialysis: Secondary | ICD-10-CM | POA: Diagnosis not present

## 2021-09-08 DIAGNOSIS — Z992 Dependence on renal dialysis: Secondary | ICD-10-CM | POA: Diagnosis not present

## 2021-09-08 DIAGNOSIS — N186 End stage renal disease: Secondary | ICD-10-CM | POA: Diagnosis not present

## 2021-09-09 DIAGNOSIS — Z992 Dependence on renal dialysis: Secondary | ICD-10-CM | POA: Diagnosis not present

## 2021-09-09 DIAGNOSIS — N186 End stage renal disease: Secondary | ICD-10-CM | POA: Diagnosis not present

## 2021-09-10 ENCOUNTER — Other Ambulatory Visit: Payer: Self-pay | Admitting: Nephrology

## 2021-09-10 DIAGNOSIS — E8770 Fluid overload, unspecified: Secondary | ICD-10-CM | POA: Diagnosis not present

## 2021-09-10 DIAGNOSIS — N186 End stage renal disease: Secondary | ICD-10-CM

## 2021-09-10 DIAGNOSIS — Z992 Dependence on renal dialysis: Secondary | ICD-10-CM

## 2021-09-10 NOTE — Progress Notes (Signed)
PD cathter not draning ?Check X ray abdomen for positioning ?

## 2021-09-11 DIAGNOSIS — Z992 Dependence on renal dialysis: Secondary | ICD-10-CM | POA: Diagnosis not present

## 2021-09-11 DIAGNOSIS — N186 End stage renal disease: Secondary | ICD-10-CM | POA: Diagnosis not present

## 2021-09-12 DIAGNOSIS — Z131 Encounter for screening for diabetes mellitus: Secondary | ICD-10-CM | POA: Diagnosis not present

## 2021-09-12 DIAGNOSIS — N186 End stage renal disease: Secondary | ICD-10-CM | POA: Diagnosis not present

## 2021-09-12 DIAGNOSIS — Z992 Dependence on renal dialysis: Secondary | ICD-10-CM | POA: Diagnosis not present

## 2021-09-12 DIAGNOSIS — E8770 Fluid overload, unspecified: Secondary | ICD-10-CM | POA: Diagnosis not present

## 2021-09-13 DIAGNOSIS — Z992 Dependence on renal dialysis: Secondary | ICD-10-CM | POA: Diagnosis not present

## 2021-09-13 DIAGNOSIS — N186 End stage renal disease: Secondary | ICD-10-CM | POA: Diagnosis not present

## 2021-09-14 DIAGNOSIS — N186 End stage renal disease: Secondary | ICD-10-CM | POA: Diagnosis not present

## 2021-09-14 DIAGNOSIS — Z992 Dependence on renal dialysis: Secondary | ICD-10-CM | POA: Diagnosis not present

## 2021-09-14 DIAGNOSIS — E8770 Fluid overload, unspecified: Secondary | ICD-10-CM | POA: Diagnosis not present

## 2021-09-15 DIAGNOSIS — Z992 Dependence on renal dialysis: Secondary | ICD-10-CM | POA: Diagnosis not present

## 2021-09-15 DIAGNOSIS — N186 End stage renal disease: Secondary | ICD-10-CM | POA: Diagnosis not present

## 2021-09-16 DIAGNOSIS — N186 End stage renal disease: Secondary | ICD-10-CM | POA: Diagnosis not present

## 2021-09-16 DIAGNOSIS — Z992 Dependence on renal dialysis: Secondary | ICD-10-CM | POA: Diagnosis not present

## 2021-09-17 DIAGNOSIS — N186 End stage renal disease: Secondary | ICD-10-CM | POA: Diagnosis not present

## 2021-09-17 DIAGNOSIS — E8770 Fluid overload, unspecified: Secondary | ICD-10-CM | POA: Diagnosis not present

## 2021-09-17 DIAGNOSIS — Z992 Dependence on renal dialysis: Secondary | ICD-10-CM | POA: Diagnosis not present

## 2021-09-18 DIAGNOSIS — Z992 Dependence on renal dialysis: Secondary | ICD-10-CM | POA: Diagnosis not present

## 2021-09-18 DIAGNOSIS — N186 End stage renal disease: Secondary | ICD-10-CM | POA: Diagnosis not present

## 2021-09-19 ENCOUNTER — Ambulatory Visit
Admission: RE | Admit: 2021-09-19 | Discharge: 2021-09-19 | Disposition: A | Discharge status: Home / Self Care | Payer: Medicare HMO | Source: Ambulatory Visit | Attending: Nephrology | Admitting: Nephrology

## 2021-09-19 ENCOUNTER — Encounter: Payer: Self-pay | Admitting: Surgery

## 2021-09-19 ENCOUNTER — Ambulatory Visit (INDEPENDENT_AMBULATORY_CARE_PROVIDER_SITE_OTHER): Payer: Medicare HMO | Admitting: Surgery

## 2021-09-19 VITALS — BP 185/94 | HR 93 | Temp 97.9°F | Ht 66.0 in | Wt 160.0 lb

## 2021-09-19 DIAGNOSIS — Z992 Dependence on renal dialysis: Secondary | ICD-10-CM | POA: Insufficient documentation

## 2021-09-19 DIAGNOSIS — Z09 Encounter for follow-up examination after completed treatment for conditions other than malignant neoplasm: Secondary | ICD-10-CM

## 2021-09-19 DIAGNOSIS — N186 End stage renal disease: Secondary | ICD-10-CM

## 2021-09-19 DIAGNOSIS — E8770 Fluid overload, unspecified: Secondary | ICD-10-CM | POA: Diagnosis not present

## 2021-09-19 DIAGNOSIS — Z4682 Encounter for fitting and adjustment of non-vascular catheter: Secondary | ICD-10-CM | POA: Diagnosis not present

## 2021-09-19 NOTE — Patient Instructions (Addendum)
We will speak with the Dialysis nurses and give your niece a call back as to what the next steps may be.  ?Have your abdominal xray done today.  ? ?If you have any concerns or questions, please feel free to call our office.  ? ? ?GENERAL POST-OPERATIVE ?PATIENT INSTRUCTIONS  ? ?WOUND CARE INSTRUCTIONS:  Keep a dry clean dressing on the wound if there is drainage. The initial bandage may be removed after 24 hours.  Once the wound has quit draining you may leave it open to air.  If clothing rubs against the wound or causes irritation and the wound is not draining you may cover it with a dry dressing during the daytime.  Try to keep the wound dry and avoid ointments on the wound unless directed to do so.  If the wound becomes bright red and painful or starts to drain infected material that is not clear, please contact your physician immediately.  If the wound is mildly pink and has a thick firm ridge underneath it, this is normal, and is referred to as a healing ridge.  This will resolve over the next 4-6 weeks. ? ?BATHING: ?You may shower if you have been informed of this by your surgeon. However, Please do not submerge in a tub, hot tub, or pool until incisions are completely sealed or have been told by your surgeon that you may do so. ? ?DIET:  You may eat any foods that you can tolerate.  It is a good idea to eat a high fiber diet and take in plenty of fluids to prevent constipation.  If you do become constipated you may want to take a mild laxative or take ducolax tablets on a daily basis until your bowel habits are regular.  Constipation can be very uncomfortable, along with straining, after recent surgery. ? ?ACTIVITY:  You are encouraged to cough and deep breath or use your incentive spirometer if you were given one, every 15-30 minutes when awake.  This will help prevent respiratory complications and low grade fevers post-operatively if you had a general anesthetic.  You may want to hug a pillow when coughing  and sneezing to add additional support to the surgical area, if you had abdominal or chest surgery, which will decrease pain during these times.  You are encouraged to walk and engage in light activity for the next two weeks.  You should not lift, push or pull more than 20 pounds for 6 weeks total after surgery as it could put you at increased risk for complications.  Twenty pounds is roughly equivalent to a plastic bag of groceries. At that time- Listen to your body when lifting, if you have pain when lifting, stop and then try again in a few days. Soreness after doing exercises or activities of daily living is normal as you get back in to your normal routine. ? ?MEDICATIONS:  Try to take narcotic medications and anti-inflammatory medications, such as tylenol, ibuprofen, naprosyn, etc., with food.  This will minimize stomach upset from the medication.  Should you develop nausea and vomiting from the pain medication, or develop a rash, please discontinue the medication and contact your physician.  You should not drive, make important decisions, or operate machinery when taking narcotic pain medication. ? ?SUNBLOCK ?Use sun block to incision area over the next year if this area will be exposed to sun. This helps decrease scarring and will allow you avoid a permanent darkened area over your incision. ? ?QUESTIONS:  Please  feel free to call our office if you have any questions, and we will be glad to assist you. [phone] ? ? ?

## 2021-09-20 DIAGNOSIS — Z992 Dependence on renal dialysis: Secondary | ICD-10-CM | POA: Diagnosis not present

## 2021-09-20 DIAGNOSIS — N186 End stage renal disease: Secondary | ICD-10-CM | POA: Diagnosis not present

## 2021-09-20 NOTE — Progress Notes (Signed)
F/u PD cath ?Devita RN having issues w drainage. He is doing well and has no complaint, no fevers or chills ?HE did not show for his XRAY ? ?PE ?NAD ?Abd: soft, PD in place  w/o infection. I flushed it and met no resistance when flushing or aspirating. Seems to be working fine ? ?A/P Doing ok, d/w [place] staff, we will gibve another trial, if issues still persist I will be happy to revise it. D/W the pt in detail. ?

## 2021-09-21 DIAGNOSIS — Z992 Dependence on renal dialysis: Secondary | ICD-10-CM | POA: Diagnosis not present

## 2021-09-21 DIAGNOSIS — N186 End stage renal disease: Secondary | ICD-10-CM | POA: Diagnosis not present

## 2021-09-22 DIAGNOSIS — N186 End stage renal disease: Secondary | ICD-10-CM | POA: Diagnosis not present

## 2021-09-22 DIAGNOSIS — Z992 Dependence on renal dialysis: Secondary | ICD-10-CM | POA: Diagnosis not present

## 2021-09-22 DIAGNOSIS — E8770 Fluid overload, unspecified: Secondary | ICD-10-CM | POA: Diagnosis not present

## 2021-09-23 DIAGNOSIS — Z992 Dependence on renal dialysis: Secondary | ICD-10-CM | POA: Diagnosis not present

## 2021-09-23 DIAGNOSIS — N186 End stage renal disease: Secondary | ICD-10-CM | POA: Diagnosis not present

## 2021-09-24 DIAGNOSIS — E8770 Fluid overload, unspecified: Secondary | ICD-10-CM | POA: Diagnosis not present

## 2021-09-24 DIAGNOSIS — Z992 Dependence on renal dialysis: Secondary | ICD-10-CM | POA: Diagnosis not present

## 2021-09-24 DIAGNOSIS — N186 End stage renal disease: Secondary | ICD-10-CM | POA: Diagnosis not present

## 2021-09-26 DIAGNOSIS — Z992 Dependence on renal dialysis: Secondary | ICD-10-CM | POA: Diagnosis not present

## 2021-09-26 DIAGNOSIS — N2581 Secondary hyperparathyroidism of renal origin: Secondary | ICD-10-CM | POA: Diagnosis not present

## 2021-09-26 DIAGNOSIS — N186 End stage renal disease: Secondary | ICD-10-CM | POA: Diagnosis not present

## 2021-09-28 DIAGNOSIS — N186 End stage renal disease: Secondary | ICD-10-CM | POA: Diagnosis not present

## 2021-09-28 DIAGNOSIS — N2581 Secondary hyperparathyroidism of renal origin: Secondary | ICD-10-CM | POA: Diagnosis not present

## 2021-09-28 DIAGNOSIS — Z992 Dependence on renal dialysis: Secondary | ICD-10-CM | POA: Diagnosis not present

## 2021-10-01 DIAGNOSIS — Z992 Dependence on renal dialysis: Secondary | ICD-10-CM | POA: Diagnosis not present

## 2021-10-01 DIAGNOSIS — N2581 Secondary hyperparathyroidism of renal origin: Secondary | ICD-10-CM | POA: Diagnosis not present

## 2021-10-01 DIAGNOSIS — N186 End stage renal disease: Secondary | ICD-10-CM | POA: Diagnosis not present

## 2021-10-03 DIAGNOSIS — N2581 Secondary hyperparathyroidism of renal origin: Secondary | ICD-10-CM | POA: Diagnosis not present

## 2021-10-03 DIAGNOSIS — N186 End stage renal disease: Secondary | ICD-10-CM | POA: Diagnosis not present

## 2021-10-03 DIAGNOSIS — Z992 Dependence on renal dialysis: Secondary | ICD-10-CM | POA: Diagnosis not present

## 2021-10-05 DIAGNOSIS — Z992 Dependence on renal dialysis: Secondary | ICD-10-CM | POA: Diagnosis not present

## 2021-10-05 DIAGNOSIS — N186 End stage renal disease: Secondary | ICD-10-CM | POA: Diagnosis not present

## 2021-10-05 DIAGNOSIS — N2581 Secondary hyperparathyroidism of renal origin: Secondary | ICD-10-CM | POA: Diagnosis not present

## 2021-10-08 DIAGNOSIS — Z992 Dependence on renal dialysis: Secondary | ICD-10-CM | POA: Diagnosis not present

## 2021-10-08 DIAGNOSIS — N186 End stage renal disease: Secondary | ICD-10-CM | POA: Diagnosis not present

## 2021-10-08 DIAGNOSIS — N2581 Secondary hyperparathyroidism of renal origin: Secondary | ICD-10-CM | POA: Diagnosis not present

## 2021-10-10 DIAGNOSIS — N2581 Secondary hyperparathyroidism of renal origin: Secondary | ICD-10-CM | POA: Diagnosis not present

## 2021-10-10 DIAGNOSIS — N186 End stage renal disease: Secondary | ICD-10-CM | POA: Diagnosis not present

## 2021-10-10 DIAGNOSIS — Z992 Dependence on renal dialysis: Secondary | ICD-10-CM | POA: Diagnosis not present

## 2021-10-12 DIAGNOSIS — N186 End stage renal disease: Secondary | ICD-10-CM | POA: Diagnosis not present

## 2021-10-12 DIAGNOSIS — N2581 Secondary hyperparathyroidism of renal origin: Secondary | ICD-10-CM | POA: Diagnosis not present

## 2021-10-12 DIAGNOSIS — Z992 Dependence on renal dialysis: Secondary | ICD-10-CM | POA: Diagnosis not present

## 2021-10-15 DIAGNOSIS — Z992 Dependence on renal dialysis: Secondary | ICD-10-CM | POA: Diagnosis not present

## 2021-10-15 DIAGNOSIS — N186 End stage renal disease: Secondary | ICD-10-CM | POA: Diagnosis not present

## 2021-10-15 DIAGNOSIS — N2581 Secondary hyperparathyroidism of renal origin: Secondary | ICD-10-CM | POA: Diagnosis not present

## 2021-10-17 DIAGNOSIS — N2581 Secondary hyperparathyroidism of renal origin: Secondary | ICD-10-CM | POA: Diagnosis not present

## 2021-10-17 DIAGNOSIS — Z992 Dependence on renal dialysis: Secondary | ICD-10-CM | POA: Diagnosis not present

## 2021-10-17 DIAGNOSIS — N186 End stage renal disease: Secondary | ICD-10-CM | POA: Diagnosis not present

## 2021-10-19 DIAGNOSIS — N2581 Secondary hyperparathyroidism of renal origin: Secondary | ICD-10-CM | POA: Diagnosis not present

## 2021-10-19 DIAGNOSIS — N186 End stage renal disease: Secondary | ICD-10-CM | POA: Diagnosis not present

## 2021-10-19 DIAGNOSIS — Z992 Dependence on renal dialysis: Secondary | ICD-10-CM | POA: Diagnosis not present

## 2021-10-22 DIAGNOSIS — Z992 Dependence on renal dialysis: Secondary | ICD-10-CM | POA: Diagnosis not present

## 2021-10-22 DIAGNOSIS — N186 End stage renal disease: Secondary | ICD-10-CM | POA: Diagnosis not present

## 2021-10-22 DIAGNOSIS — N2581 Secondary hyperparathyroidism of renal origin: Secondary | ICD-10-CM | POA: Diagnosis not present

## 2021-10-24 ENCOUNTER — Encounter: Payer: Self-pay | Admitting: Surgery

## 2021-10-24 ENCOUNTER — Ambulatory Visit (INDEPENDENT_AMBULATORY_CARE_PROVIDER_SITE_OTHER): Payer: Medicare HMO | Admitting: Surgery

## 2021-10-24 ENCOUNTER — Other Ambulatory Visit: Payer: Self-pay

## 2021-10-24 VITALS — BP 168/110 | HR 91 | Temp 98.5°F | Ht 65.0 in | Wt 161.2 lb

## 2021-10-24 DIAGNOSIS — Z992 Dependence on renal dialysis: Secondary | ICD-10-CM | POA: Diagnosis not present

## 2021-10-24 DIAGNOSIS — Z4901 Encounter for fitting and adjustment of extracorporeal dialysis catheter: Secondary | ICD-10-CM

## 2021-10-24 DIAGNOSIS — N186 End stage renal disease: Secondary | ICD-10-CM | POA: Diagnosis not present

## 2021-10-24 DIAGNOSIS — N2581 Secondary hyperparathyroidism of renal origin: Secondary | ICD-10-CM | POA: Diagnosis not present

## 2021-10-24 DIAGNOSIS — K4090 Unilateral inguinal hernia, without obstruction or gangrene, not specified as recurrent: Secondary | ICD-10-CM

## 2021-10-24 DIAGNOSIS — Z09 Encounter for follow-up examination after completed treatment for conditions other than malignant neoplasm: Secondary | ICD-10-CM

## 2021-10-24 NOTE — Progress Notes (Unsigned)
Outpatient Surgical Follow Up  10/24/2021  James Black is an 66 y.o. male.   No chief complaint on file.   HPI: f/u for PD cath removal. He feels HD is a better fit for him. No abd pain no fevers. DOing HD w/o issues  Past Medical History:  Diagnosis Date   Anemia    Arthritis    Cerebral palsy (HCC)    DDD (degenerative disc disease), lumbar    Deficiency of vitamin B12    Encephalitis    ESRD (end stage renal disease) on dialysis ([place])    M-W-F   GERD (gastroesophageal reflux disease)    Hemorrhoid    Hypercholesterolemia    Hyperkalemia    Hypertension    PONV (postoperative nausea and vomiting)    n/v after 08-16-21 capd revision   Tremor    Tubular adenoma of colon    Vitamin D deficiency     Past Surgical History:  Procedure Laterality Date   CAPD INSERTION N/A 08/10/2020   Procedure: LAPAROSCOPIC INSERTION CONTINUOUS AMBULATORY PERITONEAL DIALYSIS  (CAPD) CATHETER;  Surgeon: Jules Husbands, MD;  Location: [facility];  Service: General;  Laterality: N/A;   CAPD REVISION N/A 08/16/2021   Procedure: LAPAROSCOPIC REVISION CONTINUOUS AMBULATORY PERITONEAL DIALYSIS  (CAPD) CATHETER;  Surgeon: Jules Husbands, MD;  Location: [facility];  Service: General;  Laterality: N/A;   COLONOSCOPY     2003, 2011   COLONOSCOPY WITH PROPOFOL N/A 05/25/2015   Procedure: COLONOSCOPY WITH PROPOFOL;  Surgeon: Manya Silvas, MD;  Location: [facility];  Service: Endoscopy;  Laterality: N/A;   COLONOSCOPY WITH PROPOFOL N/A 05/12/2020   Procedure: COLONOSCOPY WITH PROPOFOL;  Surgeon: Lesly Rubenstein, MD;  Location: [facility];  Service: Endoscopy;  Laterality: N/A;   ESOPHAGOGASTRODUODENOSCOPY     2003, 2011   ESOPHAGOGASTRODUODENOSCOPY (EGD) WITH PROPOFOL N/A 05/12/2020   Procedure: ESOPHAGOGASTRODUODENOSCOPY (EGD) WITH PROPOFOL;  Surgeon: Lesly Rubenstein, MD;  Location: [facility];  Service: Endoscopy;  Laterality: N/A;   FRACTURE SURGERY Left 1997   arm   XI  ROBOTIC ASSISTED INGUINAL HERNIA REPAIR WITH MESH Right 08/23/2021   Procedure: XI ROBOTIC ASSISTED INGUINAL HERNIA REPAIR WITH MESH;  Surgeon: Jules Husbands, MD;  Location: [facility];  Service: General;  Laterality: Right;    No family history on file.  Social History:  reports that he has never smoked. He has never been exposed to tobacco smoke. He has never used smokeless tobacco. He reports that he does not drink alcohol and does not use drugs.  Allergies:  Allergies  Allergen Reactions   Cefuroxime Rash    TOLERATED CEFAZOLIN   Tramadol Nausea Only and Other (See Comments)    Dizziness    Medications reviewed.    ROS Full ROS performed and is otherwise negative other than what is stated in HPI   There were no vitals taken for this visit.  Physical Exam Vitals and nursing note reviewed. Exam conducted with a chaperone present.  Constitutional:      General: He is not in acute distress.    Appearance: Normal appearance. He is normal weight. He is not ill-appearing or toxic-appearing.  Cardiovascular:     Rate and Rhythm: Normal rate and regular rhythm.     Heart sounds: No murmur heard. Pulmonary:     Effort: Pulmonary effort is normal. No respiratory distress.     Breath sounds: Normal breath sounds. No stridor.  Abdominal:     General: Abdomen is flat. There  is no distension.     Palpations: Abdomen is soft. There is no mass.     Tenderness: There is no abdominal tenderness. There is no guarding or rebound.     Hernia: No hernia is present.     Comments: PD cath in place , no infection  Skin:    General: Skin is warm and dry.     Capillary Refill: Capillary refill takes less than 2 seconds.  Neurological:     General: No focal deficit present.     Mental Status: He is alert and oriented to person, place, and time.  Psychiatric:        Mood and Affect: Mood normal.        Behavior: Behavior normal.        Thought Content: Thought content normal.         Judgment: Judgment normal.     Assessment/Plan: 66 yo wishing to have PD cath removed. D/w the pt in detail. We can do it in a couple of weeks. Procedure d/w the pt in detail. Risks, benefits and possible complications. Tentatively schedule for removal in the OR in 2 weeks Allquestions answered   Caroleen Hamman, MD [place] Surgeon

## 2021-10-24 NOTE — Patient Instructions (Signed)
Our surgery scheduler will call you within 24-48 hours to schedule your surgery. Please have the Blue surgery sheet available when speaking with her.  

## 2021-10-24 NOTE — H&P (View-Only) (Signed)
Outpatient Surgical Follow Up  10/24/2021  James Black is an 66 y.o. male.   No chief complaint on file.   HPI: f/u for PD cath removal. He feels HD is a better fit for him. No abd pain no fevers. DOing HD w/o issues  Past Medical History:  Diagnosis Date   Anemia    Arthritis    Cerebral palsy (HCC)    DDD (degenerative disc disease), lumbar    Deficiency of vitamin B12    Encephalitis    ESRD (end stage renal disease) on dialysis ([place])    M-W-F   GERD (gastroesophageal reflux disease)    Hemorrhoid    Hypercholesterolemia    Hyperkalemia    Hypertension    PONV (postoperative nausea and vomiting)    n/v after 08-16-21 capd revision   Tremor    Tubular adenoma of colon    Vitamin D deficiency     Past Surgical History:  Procedure Laterality Date   CAPD INSERTION N/A 08/10/2020   Procedure: LAPAROSCOPIC INSERTION CONTINUOUS AMBULATORY PERITONEAL DIALYSIS  (CAPD) CATHETER;  Surgeon: Jules Husbands, MD;  Location: [facility];  Service: General;  Laterality: N/A;   CAPD REVISION N/A 08/16/2021   Procedure: LAPAROSCOPIC REVISION CONTINUOUS AMBULATORY PERITONEAL DIALYSIS  (CAPD) CATHETER;  Surgeon: Jules Husbands, MD;  Location: [facility];  Service: General;  Laterality: N/A;   COLONOSCOPY     2003, 2011   COLONOSCOPY WITH PROPOFOL N/A 05/25/2015   Procedure: COLONOSCOPY WITH PROPOFOL;  Surgeon: Manya Silvas, MD;  Location: [place];  Service: Endoscopy;  Laterality: N/A;   COLONOSCOPY WITH PROPOFOL N/A 05/12/2020   Procedure: COLONOSCOPY WITH PROPOFOL;  Surgeon: Lesly Rubenstein, MD;  Location: [facility];  Service: Endoscopy;  Laterality: N/A;   ESOPHAGOGASTRODUODENOSCOPY     2003, 2011   ESOPHAGOGASTRODUODENOSCOPY (EGD) WITH PROPOFOL N/A 05/12/2020   Procedure: ESOPHAGOGASTRODUODENOSCOPY (EGD) WITH PROPOFOL;  Surgeon: Lesly Rubenstein, MD;  Location: [facility];  Service: Endoscopy;  Laterality: N/A;   FRACTURE SURGERY Left 1997   arm   XI  ROBOTIC ASSISTED INGUINAL HERNIA REPAIR WITH MESH Right 08/23/2021   Procedure: XI ROBOTIC ASSISTED INGUINAL HERNIA REPAIR WITH MESH;  Surgeon: Jules Husbands, MD;  Location: [facility];  Service: General;  Laterality: Right;    No family history on file.  Social History:  reports that he has never smoked. He has never been exposed to tobacco smoke. He has never used smokeless tobacco. He reports that he does not drink alcohol and does not use drugs.  Allergies:  Allergies  Allergen Reactions   Cefuroxime Rash    TOLERATED CEFAZOLIN   Tramadol Nausea Only and Other (See Comments)    Dizziness    Medications reviewed.    ROS Full ROS performed and is otherwise negative other than what is stated in HPI   There were no vitals taken for this visit.  Physical Exam Vitals and nursing note reviewed. Exam conducted with a chaperone present.  Constitutional:      General: He is not in acute distress.    Appearance: Normal appearance. He is normal weight. He is not ill-appearing or toxic-appearing.  Cardiovascular:     Rate and Rhythm: Normal rate and regular rhythm.     Heart sounds: No murmur heard. Pulmonary:     Effort: Pulmonary effort is normal. No respiratory distress.     Breath sounds: Normal breath sounds. No stridor.  Abdominal:     General: Abdomen is flat. There  is no distension.     Palpations: Abdomen is soft. There is no mass.     Tenderness: There is no abdominal tenderness. There is no guarding or rebound.     Hernia: No hernia is present.     Comments: PD cath in place , no infection  Skin:    General: Skin is warm and dry.     Capillary Refill: Capillary refill takes less than 2 seconds.  Neurological:     General: No focal deficit present.     Mental Status: He is alert and oriented to person, place, and time.  Psychiatric:        Mood and Affect: Mood normal.        Behavior: Behavior normal.        Thought Content: Thought content normal.         Judgment: Judgment normal.     Assessment/Plan: 66 yo wishing to have PD cath removed. D/w the pt in detail. We can do it in a couple of weeks. Procedure d/w the pt in detail. Risks, benefits and possible complications. Tentatively schedule for removal in the OR in 2 weeks Allquestions answered   Caroleen Hamman, MD [place] Surgeon

## 2021-10-25 ENCOUNTER — Telehealth: Payer: Self-pay | Admitting: Surgery

## 2021-10-25 DIAGNOSIS — N2581 Secondary hyperparathyroidism of renal origin: Secondary | ICD-10-CM | POA: Diagnosis not present

## 2021-10-25 DIAGNOSIS — Z992 Dependence on renal dialysis: Secondary | ICD-10-CM | POA: Diagnosis not present

## 2021-10-25 DIAGNOSIS — N186 End stage renal disease: Secondary | ICD-10-CM | POA: Diagnosis not present

## 2021-10-25 NOTE — Telephone Encounter (Signed)
Spoke with niece, Lynelle Smoke and they are informed of the following regarding scheduled surgery:    Pre-Admission date/time, COVID Testing date and Surgery date.  Surgery Date: 11/08/21 Preadmission Testing Date: 10/31/21 (phone 8a-1p) Covid Testing Date: Not needed.   Patient has been made aware to call [phone], between 1-3:00pm the day before surgery, to find out what time to arrive for surgery.

## 2021-10-26 DIAGNOSIS — Z992 Dependence on renal dialysis: Secondary | ICD-10-CM | POA: Diagnosis not present

## 2021-10-26 DIAGNOSIS — N2581 Secondary hyperparathyroidism of renal origin: Secondary | ICD-10-CM | POA: Diagnosis not present

## 2021-10-26 DIAGNOSIS — N186 End stage renal disease: Secondary | ICD-10-CM | POA: Diagnosis not present

## 2021-10-29 DIAGNOSIS — N186 End stage renal disease: Secondary | ICD-10-CM | POA: Diagnosis not present

## 2021-10-29 DIAGNOSIS — N2581 Secondary hyperparathyroidism of renal origin: Secondary | ICD-10-CM | POA: Diagnosis not present

## 2021-10-29 DIAGNOSIS — Z992 Dependence on renal dialysis: Secondary | ICD-10-CM | POA: Diagnosis not present

## 2021-10-31 ENCOUNTER — Encounter
Admission: RE | Admit: 2021-10-31 | Discharge: 2021-10-31 | Disposition: A | Discharge status: Home / Self Care | Payer: Medicare HMO | Source: Ambulatory Visit | Attending: Surgery | Admitting: Surgery

## 2021-10-31 VITALS — Ht 66.0 in | Wt 160.0 lb

## 2021-10-31 DIAGNOSIS — N2581 Secondary hyperparathyroidism of renal origin: Secondary | ICD-10-CM | POA: Diagnosis not present

## 2021-10-31 DIAGNOSIS — N183 Chronic kidney disease, stage 3 unspecified: Secondary | ICD-10-CM

## 2021-10-31 DIAGNOSIS — N186 End stage renal disease: Secondary | ICD-10-CM | POA: Diagnosis not present

## 2021-10-31 DIAGNOSIS — Z992 Dependence on renal dialysis: Secondary | ICD-10-CM | POA: Diagnosis not present

## 2021-10-31 DIAGNOSIS — Z01812 Encounter for preprocedural laboratory examination: Secondary | ICD-10-CM

## 2021-10-31 DIAGNOSIS — I10 Essential (primary) hypertension: Secondary | ICD-10-CM

## 2021-10-31 NOTE — Patient Instructions (Addendum)
Your procedure is scheduled on: Thursday November 08, 2021. Report to Day Surgery inside [facility] 2nd floor, stop by admissions desk before getting on elevator. To find out your arrival time please call [phone] between 1PM - 3PM on Wednesday November 07, 2021.  Remember: Instructions that are not followed completely may result in serious medical risk,  up to and including death, or upon the discretion of your surgeon and anesthesiologist your  surgery may need to be rescheduled.     _X__ 1. Do not eat food after midnight the night before your procedure.                 No chewing gum or hard candies. You may drink clear liquids up to 2 hours                 before you are scheduled to arrive for your surgery- DO not drink clear                 liquids within 2 hours of the start of your surgery.                 Clear Liquids include:  water, apple juice without pulp, clear Gatorade, G2 or                  Gatorade Zero (avoid Red/Purple/Blue), Black Coffee or Tea (Do not add                 anything to coffee or tea).  __X__2.  On the morning of surgery brush your teeth with toothpaste and water, you                may rinse your mouth with mouthwash if you wish.  Do not swallow any toothpaste or mouthwash.     _X__ 3.  No Alcohol for 24 hours before or after surgery.   _X__ 4.  Do Not Smoke or use e-cigarettes For 24 Hours Prior to Your Surgery.                 Do not use any chewable tobacco products for at least 6 hours prior to                 Surgery.  _X__  5.  Do not use any recreational drugs (marijuana, cocaine, heroin, ecstasy, MDMA or other)                For at least one week prior to your surgery.  Combination of these drugs with anesthesia                May have life threatening results.  ____  6.  Bring all medications with you on the day of surgery if instructed.   __X__  7.  Notify your doctor if there is any change in your medical condition       (cold, fever, infections).     Do not wear jewelry, make-up, hairpins, clips or nail polish. Do not wear lotions, powders, or perfumes. You may wear deodorant. Do not shave 48 hours prior to surgery. Men may shave face and neck. Do not bring valuables to the hospital.    [facility] is not responsible for any belongings or valuables.  Contacts, dentures or bridgework may not be worn into surgery. Leave your suitcase in the car. After surgery it may be brought to your room. For patients admitted to the hospital, discharge time is determined  by your treatment team.   Patients discharged the day of surgery will not be allowed to drive home.   Make arrangements for someone to be with you for the first 24 hours of your Same Day Discharge.  __X__ Take these medicines the morning of surgery with A SIP OF WATER:    1. amLODipine (NORVASC) 5 MG  2. atorvastatin (LIPITOR) 10 MG  3. esomeprazole (NEXIUM) 40 MG  4. metoprolol tartrate (LOPRESSOR) 25 MG  5.   6.  ____ Fleet Enema (as directed)   __X__ Use CHG Soap (or wipes) as directed  ____ Use Benzoyl Peroxide Gel as instructed  ____ Use inhalers on the day of surgery  ____ Stop metformin 2 days prior to surgery    ____ Take 1/2 of usual insulin dose the night before surgery. No insulin the morning          of surgery.   ____ Call your PCP, cardiologist, or Pulmonologist if taking Coumadin/Plavix/aspirin and ask when to stop before your surgery.   __X__ One Week prior to surgery- Stop Anti-inflammatories such as Ibuprofen, Aleve, Advil, Motrin, meloxicam (MOBIC), diclofenac, etodolac, ketorolac, Toradol, Daypro, piroxicam, Goody's or BC powders. OK TO USE TYLENOL IF NEEDED   __X__ One week prior to surgery- Stop all supplements until after surgery.    ____ Bring C-Pap to the hospital.    If you have any questions regarding your pre-procedure instructions,  Please call Pre-admit Testing at [phone]

## 2021-11-01 ENCOUNTER — Encounter
Admission: RE | Admit: 2021-11-01 | Discharge: 2021-11-01 | Disposition: A | Discharge status: Home / Self Care | Payer: Medicare HMO | Source: Ambulatory Visit | Attending: Surgery | Admitting: Surgery

## 2021-11-01 DIAGNOSIS — Z01810 Encounter for preprocedural cardiovascular examination: Secondary | ICD-10-CM | POA: Diagnosis not present

## 2021-11-01 DIAGNOSIS — Z01818 Encounter for other preprocedural examination: Secondary | ICD-10-CM | POA: Diagnosis not present

## 2021-11-01 DIAGNOSIS — I10 Essential (primary) hypertension: Secondary | ICD-10-CM

## 2021-11-01 DIAGNOSIS — N186 End stage renal disease: Secondary | ICD-10-CM | POA: Insufficient documentation

## 2021-11-01 DIAGNOSIS — I120 Hypertensive chronic kidney disease with stage 5 chronic kidney disease or end stage renal disease: Secondary | ICD-10-CM | POA: Insufficient documentation

## 2021-11-01 DIAGNOSIS — Z01812 Encounter for preprocedural laboratory examination: Secondary | ICD-10-CM

## 2021-11-01 LAB — BASIC METABOLIC PANEL
Anion gap: 9 (ref 5–15)
BUN: 28 mg/dL — ABNORMAL HIGH (ref 8–23)
CO2: 28 mmol/L (ref 22–32)
Calcium: 8.4 mg/dL — ABNORMAL LOW (ref 8.9–10.3)
Chloride: 102 mmol/L (ref 98–111)
Creatinine, Ser: 8.16 mg/dL — ABNORMAL HIGH (ref 0.61–1.24)
GFR, Estimated: 7 mL/min — ABNORMAL LOW (ref >60)
Glucose, Bld: 115 mg/dL — ABNORMAL HIGH (ref 70–99)
Potassium: 4.2 mmol/L (ref 3.5–5.1)
Sodium: 139 mmol/L (ref 135–145)

## 2021-11-01 LAB — CBC
HCT: 37.7 % — ABNORMAL LOW (ref 39.0–52.0)
Hemoglobin: 11.5 g/dL — ABNORMAL LOW (ref 13.0–17.0)
MCH: 27.9 pg (ref 26.0–34.0)
MCHC: 30.5 g/dL (ref 30.0–36.0)
MCV: 91.5 fL (ref 80.0–100.0)
Platelets: 222 10*3/uL (ref 150–400)
RBC: 4.12 MIL/uL — ABNORMAL LOW (ref 4.22–5.81)
RDW: 14.1 % (ref 11.5–15.5)
WBC: 6.8 10*3/uL (ref 4.0–10.5)
nRBC: 0 % (ref 0.0–0.2)

## 2021-11-02 DIAGNOSIS — Z992 Dependence on renal dialysis: Secondary | ICD-10-CM | POA: Diagnosis not present

## 2021-11-02 DIAGNOSIS — N186 End stage renal disease: Secondary | ICD-10-CM | POA: Diagnosis not present

## 2021-11-02 DIAGNOSIS — N2581 Secondary hyperparathyroidism of renal origin: Secondary | ICD-10-CM | POA: Diagnosis not present

## 2021-11-05 DIAGNOSIS — N2581 Secondary hyperparathyroidism of renal origin: Secondary | ICD-10-CM | POA: Diagnosis not present

## 2021-11-05 DIAGNOSIS — N186 End stage renal disease: Secondary | ICD-10-CM | POA: Diagnosis not present

## 2021-11-05 DIAGNOSIS — Z992 Dependence on renal dialysis: Secondary | ICD-10-CM | POA: Diagnosis not present

## 2021-11-07 DIAGNOSIS — N186 End stage renal disease: Secondary | ICD-10-CM | POA: Diagnosis not present

## 2021-11-07 DIAGNOSIS — Z992 Dependence on renal dialysis: Secondary | ICD-10-CM | POA: Diagnosis not present

## 2021-11-07 DIAGNOSIS — N2581 Secondary hyperparathyroidism of renal origin: Secondary | ICD-10-CM | POA: Diagnosis not present

## 2021-11-08 ENCOUNTER — Encounter: Payer: Self-pay | Admitting: Surgery

## 2021-11-08 ENCOUNTER — Ambulatory Visit: Payer: Medicare HMO | Admitting: Certified Registered Nurse Anesthetist

## 2021-11-08 ENCOUNTER — Ambulatory Visit
Admission: RE | Admit: 2021-11-08 | Discharge: 2021-11-08 | Disposition: A | Discharge status: Home / Self Care | Payer: Medicare HMO | Attending: Surgery | Admitting: Surgery

## 2021-11-08 ENCOUNTER — Encounter
Admission: RE | Disposition: A | Discharge status: Home / Self Care | Payer: Self-pay | Source: Home / Self Care | Attending: Surgery

## 2021-11-08 ENCOUNTER — Other Ambulatory Visit: Payer: Self-pay

## 2021-11-08 DIAGNOSIS — Z01812 Encounter for preprocedural laboratory examination: Secondary | ICD-10-CM

## 2021-11-08 DIAGNOSIS — Z4901 Encounter for fitting and adjustment of extracorporeal dialysis catheter: Secondary | ICD-10-CM | POA: Diagnosis not present

## 2021-11-08 DIAGNOSIS — G809 Cerebral palsy, unspecified: Secondary | ICD-10-CM | POA: Diagnosis not present

## 2021-11-08 DIAGNOSIS — M1990 Unspecified osteoarthritis, unspecified site: Secondary | ICD-10-CM | POA: Diagnosis not present

## 2021-11-08 DIAGNOSIS — Z4902 Encounter for fitting and adjustment of peritoneal dialysis catheter: Secondary | ICD-10-CM | POA: Insufficient documentation

## 2021-11-08 DIAGNOSIS — Z992 Dependence on renal dialysis: Secondary | ICD-10-CM | POA: Diagnosis not present

## 2021-11-08 DIAGNOSIS — Z09 Encounter for follow-up examination after completed treatment for conditions other than malignant neoplasm: Secondary | ICD-10-CM

## 2021-11-08 DIAGNOSIS — I120 Hypertensive chronic kidney disease with stage 5 chronic kidney disease or end stage renal disease: Secondary | ICD-10-CM | POA: Diagnosis not present

## 2021-11-08 DIAGNOSIS — N183 Chronic kidney disease, stage 3 unspecified: Secondary | ICD-10-CM

## 2021-11-08 DIAGNOSIS — D631 Anemia in chronic kidney disease: Secondary | ICD-10-CM | POA: Diagnosis not present

## 2021-11-08 DIAGNOSIS — N186 End stage renal disease: Secondary | ICD-10-CM

## 2021-11-08 HISTORY — PX: CAPD REMOVAL: SHX5234

## 2021-11-08 LAB — POCT I-STAT, CHEM 8
BUN: 29 mg/dL — ABNORMAL HIGH (ref 8–23)
Calcium, Ion: 1.05 mmol/L — ABNORMAL LOW (ref 1.15–1.40)
Chloride: 100 mmol/L (ref 98–111)
Creatinine, Ser: 9.3 mg/dL — ABNORMAL HIGH (ref 0.61–1.24)
Glucose, Bld: 93 mg/dL (ref 70–99)
HCT: 38 % — ABNORMAL LOW (ref 39.0–52.0)
Hemoglobin: 12.9 g/dL — ABNORMAL LOW (ref 13.0–17.0)
Potassium: 4.6 mmol/L (ref 3.5–5.1)
Sodium: 138 mmol/L (ref 135–145)
TCO2: 25 mmol/L (ref 22–32)

## 2021-11-08 SURGERY — LAPAROSCOPIC REMOVAL CONTINUOUS AMBULATORY PERITONEAL DIALYSIS  (CAPD) CATHETER
Anesthesia: General

## 2021-11-08 MED ORDER — CELECOXIB 200 MG PO CAPS
ORAL_CAPSULE | ORAL | Status: AC
  Administered 2021-11-08: 200 mg via ORAL
  Filled 2021-11-08: qty 1

## 2021-11-08 MED ORDER — BUPIVACAINE LIPOSOME 1.3 % IJ SUSP
INTRAMUSCULAR | Status: DC | PRN
  Administered 2021-11-08: 20 mL

## 2021-11-08 MED ORDER — OXYCODONE HCL 5 MG/5ML PO SOLN: 5.0000 mg | Freq: Once | ORAL | Status: AC | PRN

## 2021-11-08 MED ORDER — GABAPENTIN 300 MG PO CAPS: 300.0000 mg | ORAL_CAPSULE | ORAL | Status: AC

## 2021-11-08 MED ORDER — HYDROCODONE-ACETAMINOPHEN 5-325 MG PO TABS: 1.0000 | ORAL_TABLET | Freq: Four times a day (QID) | ORAL | 0 refills | Status: AC | PRN

## 2021-11-08 MED ORDER — PROMETHAZINE HCL 25 MG/ML IJ SOLN: 6.2500 mg | INTRAMUSCULAR | Status: DC | PRN

## 2021-11-08 MED ORDER — MIDAZOLAM HCL 2 MG/2ML IJ SOLN
INTRAMUSCULAR | Status: AC
  Filled 2021-11-08: qty 2

## 2021-11-08 MED ORDER — SODIUM CHLORIDE 0.9 % IV SOLN: INTRAVENOUS | Status: DC

## 2021-11-08 MED ORDER — CHLORHEXIDINE GLUCONATE 0.12 % MT SOLN
OROMUCOSAL | Status: AC
  Administered 2021-11-08: 15 mL via OROMUCOSAL
  Filled 2021-11-08: qty 15

## 2021-11-08 MED ORDER — VANCOMYCIN HCL IN DEXTROSE 1-5 GM/200ML-% IV SOLN: 1000.0000 mg | Freq: Once | INTRAVENOUS | Status: DC

## 2021-11-08 MED ORDER — FENTANYL CITRATE (PF) 100 MCG/2ML IJ SOLN: 25.0000 ug | INTRAMUSCULAR | Status: DC | PRN

## 2021-11-08 MED ORDER — BUPIVACAINE-EPINEPHRINE (PF) 0.25% -1:200000 IJ SOLN
INTRAMUSCULAR | Status: AC
  Filled 2021-11-08: qty 30

## 2021-11-08 MED ORDER — DROPERIDOL 2.5 MG/ML IJ SOLN: 0.6250 mg | Freq: Once | INTRAMUSCULAR | Status: DC | PRN

## 2021-11-08 MED ORDER — BUPIVACAINE-EPINEPHRINE 0.25% -1:200000 IJ SOLN
INTRAMUSCULAR | Status: DC | PRN
  Administered 2021-11-08: 30 mL

## 2021-11-08 MED ORDER — ACETAMINOPHEN 500 MG PO TABS: 1000.0000 mg | ORAL_TABLET | ORAL | Status: AC

## 2021-11-08 MED ORDER — OXYCODONE HCL 5 MG PO TABS
5.0000 mg | ORAL_TABLET | Freq: Once | ORAL | Status: AC | PRN
  Administered 2021-11-08: 5 mg via ORAL

## 2021-11-08 MED ORDER — VANCOMYCIN HCL IN DEXTROSE 1-5 GM/200ML-% IV SOLN
INTRAVENOUS | Status: AC
  Filled 2021-11-08: qty 200

## 2021-11-08 MED ORDER — LIDOCAINE HCL (CARDIAC) PF 100 MG/5ML IV SOSY
PREFILLED_SYRINGE | INTRAVENOUS | Status: DC | PRN
  Administered 2021-11-08: 100 mg via INTRAVENOUS

## 2021-11-08 MED ORDER — FENTANYL CITRATE (PF) 100 MCG/2ML IJ SOLN
INTRAMUSCULAR | Status: DC | PRN
  Administered 2021-11-08: 50 ug via INTRAVENOUS

## 2021-11-08 MED ORDER — CHLORHEXIDINE GLUCONATE 0.12 % MT SOLN: 15.0000 mL | Freq: Once | OROMUCOSAL | Status: AC

## 2021-11-08 MED ORDER — OXYCODONE HCL 5 MG PO TABS
ORAL_TABLET | ORAL | Status: AC
  Filled 2021-11-08: qty 1

## 2021-11-08 MED ORDER — VANCOMYCIN HCL 1000 MG IV SOLR
INTRAVENOUS | Status: DC | PRN
  Administered 2021-11-08: 1000 mg via INTRAVENOUS

## 2021-11-08 MED ORDER — ORAL CARE MOUTH RINSE: 15.0000 mL | Freq: Once | OROMUCOSAL | Status: AC

## 2021-11-08 MED ORDER — PROPOFOL 10 MG/ML IV BOLUS
INTRAVENOUS | Status: DC | PRN
  Administered 2021-11-08: 200 mg via INTRAVENOUS

## 2021-11-08 MED ORDER — ACETAMINOPHEN 500 MG PO TABS
ORAL_TABLET | ORAL | Status: AC
  Administered 2021-11-08: 1000 mg via ORAL
  Filled 2021-11-08: qty 2

## 2021-11-08 MED ORDER — PHENYLEPHRINE 80 MCG/ML (10ML) SYRINGE FOR IV PUSH (FOR BLOOD PRESSURE SUPPORT)
PREFILLED_SYRINGE | INTRAVENOUS | Status: DC | PRN
  Administered 2021-11-08: 80 ug via INTRAVENOUS
  Administered 2021-11-08: 160 ug via INTRAVENOUS

## 2021-11-08 MED ORDER — ACETAMINOPHEN 10 MG/ML IV SOLN: 1000.0000 mg | Freq: Once | INTRAVENOUS | Status: DC | PRN

## 2021-11-08 MED ORDER — CELECOXIB 200 MG PO CAPS: 200.0000 mg | ORAL_CAPSULE | ORAL | Status: AC

## 2021-11-08 MED ORDER — BUPIVACAINE LIPOSOME 1.3 % IJ SUSP
INTRAMUSCULAR | Status: AC
  Filled 2021-11-08: qty 20

## 2021-11-08 MED ORDER — ONDANSETRON HCL 4 MG/2ML IJ SOLN
INTRAMUSCULAR | Status: DC | PRN
  Administered 2021-11-08: 4 mg via INTRAVENOUS

## 2021-11-08 MED ORDER — ROCURONIUM BROMIDE 100 MG/10ML IV SOLN
INTRAVENOUS | Status: DC | PRN
  Administered 2021-11-08: 60 mg via INTRAVENOUS

## 2021-11-08 MED ORDER — FENTANYL CITRATE (PF) 100 MCG/2ML IJ SOLN
INTRAMUSCULAR | Status: AC
  Filled 2021-11-08: qty 2

## 2021-11-08 MED ORDER — GABAPENTIN 300 MG PO CAPS
ORAL_CAPSULE | ORAL | Status: AC
  Administered 2021-11-08: 300 mg via ORAL
  Filled 2021-11-08: qty 1

## 2021-11-08 MED ORDER — CHLORHEXIDINE GLUCONATE CLOTH 2 % EX PADS: 6.0000 | MEDICATED_PAD | Freq: Once | CUTANEOUS | Status: DC

## 2021-11-08 MED ORDER — DEXAMETHASONE SODIUM PHOSPHATE 10 MG/ML IJ SOLN
INTRAMUSCULAR | Status: DC | PRN
  Administered 2021-11-08: 10 mg via INTRAVENOUS

## 2021-11-08 MED ORDER — MIDAZOLAM HCL 2 MG/2ML IJ SOLN
INTRAMUSCULAR | Status: DC | PRN
  Administered 2021-11-08: 2 mg via INTRAVENOUS

## 2021-11-08 MED ORDER — SUGAMMADEX SODIUM 200 MG/2ML IV SOLN
INTRAVENOUS | Status: DC | PRN
  Administered 2021-11-08: 200 mg via INTRAVENOUS

## 2021-11-08 SURGICAL SUPPLY — 42 items
ADH SKN CLS APL DERMABOND .7 (GAUZE/BANDAGES/DRESSINGS) ×1
APL PRP STRL LF DISP 70% ISPRP (MISCELLANEOUS) ×1
BLADE CLIPPER SURG (BLADE) ×1 IMPLANT
CHLORAPREP W/TINT 26 (MISCELLANEOUS) ×2 IMPLANT
DEFOGGER SCOPE WARMER CLEARIFY (MISCELLANEOUS) ×2 IMPLANT
DERMABOND ADVANCED (GAUZE/BANDAGES/DRESSINGS) ×1
DERMABOND ADVANCED .7 DNX12 (GAUZE/BANDAGES/DRESSINGS) ×1 IMPLANT
ELECT CAUTERY BLADE 6.4 (BLADE) ×2 IMPLANT
ELECT REM PT RETURN 9FT ADLT (ELECTROSURGICAL) ×2
ELECTRODE REM PT RTRN 9FT ADLT (ELECTROSURGICAL) ×1 IMPLANT
GLOVE BIO SURGEON STRL SZ7 (GLOVE) ×4 IMPLANT
GOWN STRL REUS W/ TWL LRG LVL3 (GOWN DISPOSABLE) ×2 IMPLANT
GOWN STRL REUS W/TWL LRG LVL3 (GOWN DISPOSABLE) ×4
GRASPER SUT TROCAR 14GX15 (MISCELLANEOUS) ×1 IMPLANT
KIT TURNOVER KIT A (KITS) ×2 IMPLANT
MANIFOLD NEPTUNE II (INSTRUMENTS) ×1 IMPLANT
NDL INSUFFLATION 14GA 120MM (NEEDLE) ×1 IMPLANT
NDL SAFETY ECLIPSE 18X1.5 (NEEDLE) ×1 IMPLANT
NEEDLE HYPO 18GX1.5 SHARP (NEEDLE) ×2
NEEDLE HYPO 22GX1.5 SAFETY (NEEDLE) ×2 IMPLANT
NEEDLE INSUFFLATION 14GA 120MM (NEEDLE) IMPLANT
NS IRRIG 500ML POUR BTL (IV SOLUTION) ×2 IMPLANT
PACK LAP CHOLECYSTECTOMY (MISCELLANEOUS) ×2 IMPLANT
PENCIL ELECTRO HAND CTR (MISCELLANEOUS) ×2 IMPLANT
SET TUBE SMOKE EVAC HIGH FLOW (TUBING) ×2 IMPLANT
SLEEVE ADV FIXATION 5X100MM (TROCAR) ×1 IMPLANT
SPONGE T-LAP 18X18 ~~LOC~~+RFID (SPONGE) ×2 IMPLANT
SUT ETHILON 3-0 FS-10 30 BLK (SUTURE)
SUT MNCRL 4-0 (SUTURE) ×2
SUT MNCRL 4-0 27XMFL (SUTURE) ×1
SUT VIC AB 3-0 SH 27 (SUTURE) ×2
SUT VIC AB 3-0 SH 27X BRD (SUTURE) ×1 IMPLANT
SUT VICRYL 0 AB UR-6 (SUTURE) ×4 IMPLANT
SUTURE EHLN 3-0 FS-10 30 BLK (SUTURE) IMPLANT
SUTURE MNCRL 4-0 27XMF (SUTURE) ×1 IMPLANT
SYR 20ML LL LF (SYRINGE) ×2 IMPLANT
SYR 3ML LL SCALE MARK (SYRINGE) ×2 IMPLANT
SYS KII FIOS ACCESS ABD 5X100 (TROCAR)
SYSTEM KII FIOS ACES ABD 5X100 (TROCAR) ×1 IMPLANT
TROCAR BALLN GELPORT 12X130M (ENDOMECHANICALS) ×1 IMPLANT
TROCAR XCEL NON-BLD 5MMX100MML (ENDOMECHANICALS) ×1 IMPLANT
WATER STERILE IRR 500ML POUR (IV SOLUTION) ×1 IMPLANT

## 2021-11-08 NOTE — Progress Notes (Signed)
PD catheter removed, sutures abd c/d/I Patient right chest with udall catheter HD M_W_F, had HD yesterday. Tolerated gingerale and crackers without event.

## 2021-11-08 NOTE — Op Note (Signed)
Removal of peritoneal Dialysis catheter ( Total three cuffs)   Pre-operative Diagnosis: ESRD   Post-operative Diagnosis: same     Surgeon: Caroleen Hamman, MD FACS   Anesthesia: Gen. with endotracheal tube      Findings: Catheter within pelvis NO evidence of infection   Estimated Blood Loss: 5cc              Complications: none     Procedure Details  The patient was seen again in the Holding Room. The benefits, complications, treatment options, and expected outcomes were discussed with the patient. The risks of bleeding, infection, recurrence of symptoms, failure to resolve symptoms, bowel injury, any of which could require further surgery were reviewed with the patient. The likelihood of improving the patient's symptoms with return to their baseline status is good.  The patient and/or family concurred with the proposed plan, giving informed consent.  The patient was taken to Operating Room, identified and the procedure verified. A Time Out was held and the above information confirmed.   Prior to the induction of general anesthesia, antibiotic prophylaxis was administered. VTE prophylaxis was in place. General endotracheal anesthesia was then administered and tolerated well. After the induction, the abdomen was prepped with Chloraprep and draped in the sterile fashion. The patient was positioned in the supine position.   Periumbilical incision created to the left of the midline.   The anterior rectus fascia identified and incised, the cuff was identified and dissected from adjacent structures. I was able to pull on the catheter and retrieve the distal portion intact. Another incision created left subcostal area where the emining of the cuffs were. We identified the cuffs and dissected them free from subq tissue. THe remainder of the catheter was removed intact. All skin incisions  were infiltrated with a liposomal Marcaine. Fascia was closed w a 0 vicryl. 4-0 subcuticular Monocryl was used  to close the skin. Dermabond was  applied. Sterile dressing applied to the exit site. The patient was then extubated and brought to the recovery room in stable condition. Sponge, lap, and needle counts were correct at closure and at the conclusion of the case.               Caroleen Hamman, MD, FACS

## 2021-11-08 NOTE — Anesthesia Procedure Notes (Signed)
Procedure Name: Intubation Date/Time: 11/08/2021 11:19 AM  Performed by: Lily Peer, Trashawn Oquendo, CRNAPre-anesthesia Checklist: Patient identified, Emergency Drugs available, Suction available and Patient being monitored Patient Re-evaluated:Patient Re-evaluated prior to induction Oxygen Delivery Method: Circle system utilized Preoxygenation: Pre-oxygenation with 100% oxygen Induction Type: IV induction Ventilation: Mask ventilation without difficulty Laryngoscope Size: McGraph and 4 Grade View: Grade I Tube type: Oral Number of attempts: 1 Airway Equipment and Method: Stylet and Oral airway Placement Confirmation: ETT inserted through vocal cords under direct vision, positive ETCO2 and breath sounds checked- equal and bilateral Secured at: 21 cm Tube secured with: Tape Dental Injury: Teeth and Oropharynx as per pre-operative assessment

## 2021-11-08 NOTE — Discharge Instructions (Signed)

## 2021-11-08 NOTE — Anesthesia Preprocedure Evaluation (Addendum)
Anesthesia Evaluation  Patient identified by MRN, date of birth, ID band Patient awake    Reviewed: Allergy & Precautions, NPO status , Patient's Chart, lab work & pertinent test results  History of Anesthesia Complications Negative for: history of anesthetic complications  Airway Mallampati: III  TM Distance: >3 FB Neck ROM: Full    Dental no notable dental hx. (+) Teeth Intact   Pulmonary neg pulmonary ROS, neg sleep apnea, neg COPD, Patient abstained from smoking.Not current smoker,    Pulmonary exam normal breath sounds clear to auscultation       Cardiovascular Exercise Tolerance: Good METShypertension, Pt. on medications (-) CAD and (-) Past MI Normal cardiovascular exam(-) dysrhythmias  Rhythm:Regular Rate:Normal  ECG 08/11/20: normal   Neuro/Psych Cerebral palsy negative psych ROS   GI/Hepatic GERD  Controlled and Medicated,(+)     (-) substance abuse  ,   Endo/Other  negative endocrine ROSneg diabetes  Renal/GU ESRF and DialysisRenal disease (last HD 08/15/21)     Musculoskeletal  (+) Arthritis , Osteoarthritis,    Abdominal Normal abdominal exam  (+)   Peds  Hematology  (+) Blood dyscrasia, anemia ,   Anesthesia Other Findings Past Medical History: No date: Anemia No date: Arthritis No date: Cerebral palsy (HCC) No date: DDD (degenerative disc disease), lumbar No date: Deficiency of vitamin B12 No date: Encephalitis No date: ESRD (end stage renal disease) on dialysis ([facility])     Comment:  M-W-F No date: GERD (gastroesophageal reflux disease) No date: Hemorrhoid No date: Hypercholesterolemia No date: Hyperkalemia No date: Hypertension No date: PONV (postoperative nausea and vomiting)     Comment:  n/v after 08-16-21 capd revision No date: Tremor No date: Tubular adenoma of colon No date: Vitamin D deficiency  Reproductive/Obstetrics                            Anesthesia  Physical  Anesthesia Plan  ASA: 3  Anesthesia Plan: General   Post-op Pain Management: Ofirmev IV (intra-op)*, Celebrex PO (pre-op)* and Gabapentin PO (pre-op)*   Induction: Intravenous  PONV Risk Score and Plan: 4 or greater and Ondansetron, Dexamethasone, Treatment may vary due to age or medical condition and Midazolam  Airway Management Planned: Oral ETT  Additional Equipment: None  Intra-op Plan:   Post-operative Plan: Extubation in OR  Informed Consent: I have reviewed the patients History and Physical, chart, labs and discussed the procedure including the risks, benefits and alternatives for the proposed anesthesia with the patient or authorized representative who has indicated his/her understanding and acceptance.     Dental advisory given  Plan Discussed with: CRNA  Anesthesia Plan Comments: (Discussed risks of anesthesia with patient, including PONV, sore throat, lip/dental/eye damage. Rare risks discussed as well, such as cardiorespiratory and neurological sequelae, and allergic reactions. Discussed the role of CRNA in patient's perioperative care. Patient understands.)       Anesthesia Quick Evaluation

## 2021-11-08 NOTE — Transfer of Care (Signed)
Immediate Anesthesia Transfer of Care Note  Patient: Meriam Sprague  Procedure(s) Performed: REMOVAL CONTINUOUS AMBULATORY PERITONEAL DIALYSIS  (CAPD) CATHETER  Patient Location: PACU  Anesthesia Type:General  Level of Consciousness: awake, alert  and oriented  Airway & Oxygen Therapy: Patient Spontanous Breathing and Patient connected to face mask oxygen  Post-op Assessment: Report given to RN and Post -op Vital signs reviewed and stable  Post vital signs: Reviewed and stable  Last Vitals:  Vitals Value Taken Time  BP 162/88 11/08/21 1215  Temp    Pulse 59 11/08/21 1218  Resp 14 11/08/21 1218  SpO2 100 % 11/08/21 1218  Vitals shown include unvalidated device data.  Last Pain:  Vitals:   11/08/21 1019  TempSrc: Oral         Complications: No notable events documented.

## 2021-11-08 NOTE — Anesthesia Postprocedure Evaluation (Signed)
Anesthesia Post Note  Patient: James Black  Procedure(s) Performed: REMOVAL CONTINUOUS AMBULATORY PERITONEAL DIALYSIS  (CAPD) CATHETER  Patient location during evaluation: PACU Anesthesia Type: General Level of consciousness: awake and alert Pain management: pain level controlled Vital Signs Assessment: post-procedure vital signs reviewed and stable Respiratory status: spontaneous breathing, nonlabored ventilation and respiratory function stable Cardiovascular status: blood pressure returned to baseline and stable Postop Assessment: no apparent nausea or vomiting Anesthetic complications: no   No notable events documented.   Last Vitals:  Vitals:   11/08/21 1245 11/08/21 1252  BP: (!) 167/99 (!) 174/94  Pulse: 71 66  Resp: 16 16  Temp: (!) 36.4 C (!) 36.1 C  SpO2: 97% 100%    Last Pain:  Vitals:   11/08/21 1252  TempSrc: Temporal  PainSc: 0-No pain                 [place] Ouch

## 2021-11-08 NOTE — Interval H&P Note (Signed)
History and Physical Interval Note:  11/08/2021 10:55 AM  James Black  has presented today for surgery, with the diagnosis of ESRD.  The various methods of treatment have been discussed with the patient and family. After consideration of risks, benefits and other options for treatment, the patient has consented to  Procedure(s): LAPAROSCOPIC REMOVAL CONTINUOUS AMBULATORY PERITONEAL DIALYSIS  (CAPD) CATHETER (N/A) as a surgical intervention.  The patient's history has been reviewed, patient examined, no change in status, stable for surgery.  I have reviewed the patient's chart and labs.  Questions were answered to the patient's satisfaction.     Tripp

## 2021-11-09 DIAGNOSIS — N186 End stage renal disease: Secondary | ICD-10-CM | POA: Diagnosis not present

## 2021-11-09 DIAGNOSIS — N2581 Secondary hyperparathyroidism of renal origin: Secondary | ICD-10-CM | POA: Diagnosis not present

## 2021-11-09 DIAGNOSIS — Z992 Dependence on renal dialysis: Secondary | ICD-10-CM | POA: Diagnosis not present

## 2021-11-12 DIAGNOSIS — Z992 Dependence on renal dialysis: Secondary | ICD-10-CM | POA: Diagnosis not present

## 2021-11-12 DIAGNOSIS — N2581 Secondary hyperparathyroidism of renal origin: Secondary | ICD-10-CM | POA: Diagnosis not present

## 2021-11-12 DIAGNOSIS — N186 End stage renal disease: Secondary | ICD-10-CM | POA: Diagnosis not present

## 2021-11-13 DIAGNOSIS — H401133 Primary open-angle glaucoma, bilateral, severe stage: Secondary | ICD-10-CM | POA: Diagnosis not present

## 2021-11-14 DIAGNOSIS — N186 End stage renal disease: Secondary | ICD-10-CM | POA: Diagnosis not present

## 2021-11-14 DIAGNOSIS — Z992 Dependence on renal dialysis: Secondary | ICD-10-CM | POA: Diagnosis not present

## 2021-11-14 DIAGNOSIS — N2581 Secondary hyperparathyroidism of renal origin: Secondary | ICD-10-CM | POA: Diagnosis not present

## 2021-11-16 DIAGNOSIS — N2581 Secondary hyperparathyroidism of renal origin: Secondary | ICD-10-CM | POA: Diagnosis not present

## 2021-11-16 DIAGNOSIS — Z992 Dependence on renal dialysis: Secondary | ICD-10-CM | POA: Diagnosis not present

## 2021-11-16 DIAGNOSIS — N186 End stage renal disease: Secondary | ICD-10-CM | POA: Diagnosis not present

## 2021-11-19 ENCOUNTER — Other Ambulatory Visit (INDEPENDENT_AMBULATORY_CARE_PROVIDER_SITE_OTHER): Payer: Self-pay | Admitting: Nurse Practitioner

## 2021-11-19 DIAGNOSIS — N186 End stage renal disease: Secondary | ICD-10-CM

## 2021-11-19 DIAGNOSIS — Z992 Dependence on renal dialysis: Secondary | ICD-10-CM | POA: Diagnosis not present

## 2021-11-19 DIAGNOSIS — N2581 Secondary hyperparathyroidism of renal origin: Secondary | ICD-10-CM | POA: Diagnosis not present

## 2021-11-20 ENCOUNTER — Ambulatory Visit (INDEPENDENT_AMBULATORY_CARE_PROVIDER_SITE_OTHER): Payer: Medicare HMO

## 2021-11-20 ENCOUNTER — Encounter (INDEPENDENT_AMBULATORY_CARE_PROVIDER_SITE_OTHER): Payer: Self-pay | Admitting: Nurse Practitioner

## 2021-11-20 ENCOUNTER — Ambulatory Visit (INDEPENDENT_AMBULATORY_CARE_PROVIDER_SITE_OTHER): Payer: Medicare HMO | Admitting: Nurse Practitioner

## 2021-11-20 VITALS — BP 195/104 | HR 73 | Resp 17 | Ht 65.0 in | Wt 162.0 lb

## 2021-11-20 DIAGNOSIS — N186 End stage renal disease: Secondary | ICD-10-CM | POA: Diagnosis not present

## 2021-11-20 DIAGNOSIS — E7800 Pure hypercholesterolemia, unspecified: Secondary | ICD-10-CM | POA: Diagnosis not present

## 2021-11-20 DIAGNOSIS — I10 Essential (primary) hypertension: Secondary | ICD-10-CM | POA: Diagnosis not present

## 2021-11-20 NOTE — H&P (View-Only) (Signed)
Subjective:    Patient ID: James Black, male    DOB: 1956-02-21, 66 y.o.   MRN: [id number] Chief Complaint  Patient presents with   New Patient (Initial Visit)    Vein mapping and consult    The patient is seen for evaluation for dialysis access.  The patient has previously been on peritoneal dialysis however required to transition to hemodialysis.  He is currently maintained via PermCath is not doing hemodialysis for the last week or so.  The patient is followed by nephrology.    The patient is right-handed.  The patient has been considering the various methods of dialysis and wishes to proceed with hemodialysis and therefore creation of AV access.  No recent shortening of the patient's walking distance or new symptoms consistent with claudication.  No history of rest pain symptoms. No new ulcers or wounds of the lower extremities have occurred.  The patient denies amaurosis fugax or recent TIA symptoms. There are no recent neurological changes noted. There is no history of DVT, PE or superficial thrombophlebitis. No recent episodes of angina or shortness of breath documented.    Today noninvasive studies show adequate access for a left brachial axillary AV graft.    Review of Systems  All other systems reviewed and are negative.      Objective:   Physical Exam Vitals reviewed.  HENT:     Head: Normocephalic.  Cardiovascular:     Rate and Rhythm: Normal rate.     Pulses:          Radial pulses are 1+ on the right side and 1+ on the left side.  Pulmonary:     Effort: Pulmonary effort is normal.  Skin:    General: Skin is warm and dry.  Neurological:     Mental Status: He is alert and oriented to person, place, and time.  Psychiatric:        Mood and Affect: Mood normal.        Behavior: Behavior normal.        Thought Content: Thought content normal.        Judgment: Judgment normal.     BP (!) 195/104 (BP Location: Left Arm)   Pulse 73   Resp 17   Ht 5'  5" (1.651 m)   Wt 162 lb (73.5 kg)   BMI 26.96 kg/m   Past Medical History:  Diagnosis Date   Anemia    Arthritis    Cerebral palsy (HCC)    DDD (degenerative disc disease), lumbar    Deficiency of vitamin B12    Encephalitis    ESRD (end stage renal disease) on dialysis (HCC)    M-W-F   GERD (gastroesophageal reflux disease)    Hemorrhoid    Hypercholesterolemia    Hyperkalemia    Hypertension    PONV (postoperative nausea and vomiting)    n/v after 08-16-21 capd revision   Tremor    Tubular adenoma of colon    Vitamin D deficiency     Social History   Socioeconomic History   Marital status: Single    Spouse name: Not on file   Number of children: Not on file   Years of education: Not on file   Highest education level: Not on file  Occupational History   Not on file  Tobacco Use   Smoking status: Never    Passive exposure: Never   Smokeless tobacco: Never  Vaping Use   Vaping Use: Never used  Substance  and Sexual Activity   Alcohol use: No   Drug use: No   Sexual activity: Not on file  Other Topics Concern   Not on file  Social History Narrative   Lives with niece   Social Determinants of Health   Financial Resource Strain: Not on file  Food Insecurity: Not on file  Transportation Needs: Not on file  Physical Activity: Not on file  Stress: Not on file  Social Connections: Not on file  Intimate Partner Violence: Not on file    Past Surgical History:  Procedure Laterality Date   CAPD INSERTION N/A 08/10/2020   Procedure: [place]  (CAPD) CATHETER;  Surgeon: Jules Husbands, MD;  Location: [facility] ORS;  Service: General;  Laterality: N/A;   CAPD REMOVAL N/A 11/08/2021   Procedure: REMOVAL CONTINUOUS AMBULATORY PERITONEAL DIALYSIS  (CAPD) CATHETER;  Surgeon: Jules Husbands, MD;  Location: [facility];  Service: General;  Laterality: N/A;   CAPD REVISION N/A 08/16/2021   Procedure: LAPAROSCOPIC REVISION  CONTINUOUS AMBULATORY PERITONEAL DIALYSIS  (CAPD) CATHETER;  Surgeon: Jules Husbands, MD;  Location: [facility];  Service: General;  Laterality: N/A;   COLONOSCOPY     2003, 2011   COLONOSCOPY WITH PROPOFOL N/A 05/25/2015   Procedure: COLONOSCOPY WITH PROPOFOL;  Surgeon: Manya Silvas, MD;  Location: [facility] ENDOSCOPY;  Service: Endoscopy;  Laterality: N/A;   COLONOSCOPY WITH PROPOFOL N/A 05/12/2020   Procedure: COLONOSCOPY WITH PROPOFOL;  Surgeon: Lesly Rubenstein, MD;  Location: [facility] ENDOSCOPY;  Service: Endoscopy;  Laterality: N/A;   ESOPHAGOGASTRODUODENOSCOPY     2003, 2011   ESOPHAGOGASTRODUODENOSCOPY (EGD) WITH PROPOFOL N/A 05/12/2020   Procedure: ESOPHAGOGASTRODUODENOSCOPY (EGD) WITH PROPOFOL;  Surgeon: Lesly Rubenstein, MD;  Location: [facility] ENDOSCOPY;  Service: Endoscopy;  Laterality: N/A;   FRACTURE SURGERY Left 1997   arm   XI ROBOTIC ASSISTED INGUINAL HERNIA REPAIR WITH MESH Right 08/23/2021   Procedure: XI ROBOTIC ASSISTED INGUINAL HERNIA REPAIR WITH MESH;  Surgeon: Jules Husbands, MD;  Location: [facility] ORS;  Service: General;  Laterality: Right;    History reviewed. No pertinent family history.  Allergies  Allergen Reactions   Cefuroxime Rash    TOLERATED CEFAZOLIN   Tramadol Nausea Only and Other (See Comments)    Dizziness       Latest Ref Rng & Units 11/08/2021   10:42 AM 11/01/2021   10:54 AM 08/23/2021    6:52 AM  CBC  WBC 4.0 - 10.5 K/uL  6.8    Hemoglobin 13.0 - 17.0 g/dL 12.9  11.5  8.2   Hematocrit 39.0 - 52.0 % 38.0  37.7  24.0   Platelets 150 - 400 K/uL  222        CMP     Component Value Date/Time   NA 138 11/08/2021 1042   K 4.6 11/08/2021 1042   CL 100 11/08/2021 1042   CO2 28 11/01/2021 1054   GLUCOSE 93 11/08/2021 1042   BUN 29 (H) 11/08/2021 1042   CREATININE 9.30 (H) 11/08/2021 1042   CALCIUM 8.4 (L) 11/01/2021 1054   GFRNONAA 7 (L) 11/01/2021 1054     No results found.     Assessment & Plan:   1. End stage renal disease  ([place]) Recommend:  At this time the patient does not have appropriate extremity access for dialysis  Patient should have a left brachial axillary graft created.  The risks, benefits and alternative therapies were reviewed in detail with the patient.  All questions  were answered.  The patient agrees to proceed with surgery.   The patient will follow up with me in the office after the surgery.   2. Hypercholesterolemia Continue statin as ordered and reviewed, no changes at this time   3. Benign essential hypertension Continue antihypertensive medications as already ordered, these medications have been reviewed and there are no changes at this time.  The patient does have elevated blood pressure to be notes that he did not take his medications.  He is advised to take them as soon as he returns home.   Current Outpatient Medications on File Prior to Visit  Medication Sig Dispense Refill   amLODipine (NORVASC) 5 MG tablet Take 5 mg by mouth every morning.     atorvastatin (LIPITOR) 10 MG tablet Take 10 mg by mouth every morning.     calcitRIOL (ROCALTROL) 0.25 MCG capsule Take 0.25 mcg by mouth daily.     Cholecalciferol 5000 units TABS Take 5,000 Units by mouth once a week.     dorzolamide-timolol (COSOPT) 22.3-6.8 MG/ML ophthalmic solution Place 1 drop into both eyes 2 (two) times daily.     fluticasone (FLONASE) 50 MCG/ACT nasal spray Place 2 sprays into both nostrils daily as needed for allergies or rhinitis.     furosemide (LASIX) 80 MG tablet Take 80 mg by mouth daily.     HYDROcodone-acetaminophen (NORCO/VICODIN) 5-325 MG tablet Take 1-2 tablets by mouth every 6 (six) hours as needed for moderate pain. 12 tablet 0   lactulose (CHRONULAC) 10 GM/15ML solution Take 20 g by mouth 2 (two) times daily.     losartan (COZAAR) 50 MG tablet Take 50 mg by mouth at bedtime.     metoprolol tartrate (LOPRESSOR) 25 MG tablet Take 25 mg by mouth every morning.     Netarsudil-Latanoprost (ROCKLATAN)  0.02-0.005 % SOLN Place 1 drop into both eyes every evening.     pantoprazole (PROTONIX) 20 MG tablet Take 20 mg by mouth every morning.     potassium chloride SA (KLOR-CON) 20 MEQ tablet Take 1 tablet (20 mEq total) by mouth 2 (two) times daily. 10 tablet 0   esomeprazole (NEXIUM) 40 MG capsule Take 40 mg by mouth 2 (two) times daily. (Patient not taking: Reported on 11/08/2021)     No current facility-administered medications on file prior to visit.    There are no Patient Instructions on file for this visit. No follow-ups on file.   Kris Hartmann, NP

## 2021-11-20 NOTE — Progress Notes (Addendum)
Subjective:    Patient ID: James Black, male    DOB: 1956-02-21, 66 y.o.   MRN: [id number] Chief Complaint  Patient presents with   New Patient (Initial Visit)    Vein mapping and consult    The patient is seen for evaluation for dialysis access.  The patient has previously been on peritoneal dialysis however required to transition to hemodialysis.  He is currently maintained via PermCath is not doing hemodialysis for the last week or so.  The patient is followed by nephrology.    The patient is right-handed.  The patient has been considering the various methods of dialysis and wishes to proceed with hemodialysis and therefore creation of AV access.  No recent shortening of the patient's walking distance or new symptoms consistent with claudication.  No history of rest pain symptoms. No new ulcers or wounds of the lower extremities have occurred.  The patient denies amaurosis fugax or recent TIA symptoms. There are no recent neurological changes noted. There is no history of DVT, PE or superficial thrombophlebitis. No recent episodes of angina or shortness of breath documented.    Today noninvasive studies show adequate access for a left brachial axillary AV graft.    Review of Systems  All other systems reviewed and are negative.      Objective:   Physical Exam Vitals reviewed.  HENT:     Head: Normocephalic.  Cardiovascular:     Rate and Rhythm: Normal rate.     Pulses:          Radial pulses are 1+ on the right side and 1+ on the left side.  Pulmonary:     Effort: Pulmonary effort is normal.  Skin:    General: Skin is warm and dry.  Neurological:     Mental Status: He is alert and oriented to person, place, and time.  Psychiatric:        Mood and Affect: Mood normal.        Behavior: Behavior normal.        Thought Content: Thought content normal.        Judgment: Judgment normal.     BP (!) 195/104 (BP Location: Left Arm)   Pulse 73   Resp 17   Ht 5'  5" (1.651 m)   Wt 162 lb (73.5 kg)   BMI 26.96 kg/m   Past Medical History:  Diagnosis Date   Anemia    Arthritis    Cerebral palsy (HCC)    DDD (degenerative disc disease), lumbar    Deficiency of vitamin B12    Encephalitis    ESRD (end stage renal disease) on dialysis (HCC)    M-W-F   GERD (gastroesophageal reflux disease)    Hemorrhoid    Hypercholesterolemia    Hyperkalemia    Hypertension    PONV (postoperative nausea and vomiting)    n/v after 08-16-21 capd revision   Tremor    Tubular adenoma of colon    Vitamin D deficiency     Social History   Socioeconomic History   Marital status: Single    Spouse name: Not on file   Number of children: Not on file   Years of education: Not on file   Highest education level: Not on file  Occupational History   Not on file  Tobacco Use   Smoking status: Never    Passive exposure: Never   Smokeless tobacco: Never  Vaping Use   Vaping Use: Never used  Substance  and Sexual Activity   Alcohol use: No   Drug use: No   Sexual activity: Not on file  Other Topics Concern   Not on file  Social History Narrative   Lives with niece   Social Determinants of Health   Financial Resource Strain: Not on file  Food Insecurity: Not on file  Transportation Needs: Not on file  Physical Activity: Not on file  Stress: Not on file  Social Connections: Not on file  Intimate Partner Violence: Not on file    Past Surgical History:  Procedure Laterality Date   CAPD INSERTION N/A 08/10/2020   Procedure: [place]  (CAPD) CATHETER;  Surgeon: Jules Husbands, MD;  Location: [facility] ORS;  Service: General;  Laterality: N/A;   CAPD REMOVAL N/A 11/08/2021   Procedure: REMOVAL CONTINUOUS AMBULATORY PERITONEAL DIALYSIS  (CAPD) CATHETER;  Surgeon: Jules Husbands, MD;  Location: [facility];  Service: General;  Laterality: N/A;   CAPD REVISION N/A 08/16/2021   Procedure: LAPAROSCOPIC REVISION  CONTINUOUS AMBULATORY PERITONEAL DIALYSIS  (CAPD) CATHETER;  Surgeon: Jules Husbands, MD;  Location: [facility] OR[facility];  Service: General;  Laterality: N/A;   COLONOSCOPY     2003, 2011   COLONOSCOPY WITH PROPOFOL N/A 05/25/2015   Procedure: COLONOSCOPY WITH PROPOFOL;  Surgeon: Manya Silvas, MD;  Location: [facility] ENDOSCOPY;  Service: Endoscopy;  Laterality: N/A;   COLONOSCOPY WITH PROPOFOL N/A 05/12/2020   Procedure: COLONOSCOPY WITH PROPOFOL;  Surgeon: Lesly Rubenstein, MD;  Location: [facility] ENDOSCOPY;  Service: Endoscopy;  Laterality: N/A;   ESOPHAGOGASTRODUODENOSCOPY     2003, 2011   ESOPHAGOGASTRODUODENOSCOPY (EGD) WITH PROPOFOL N/A 05/12/2020   Procedure: ESOPHAGOGASTRODUODENOSCOPY (EGD) WITH PROPOFOL;  Surgeon: Lesly Rubenstein, MD;  Location: [facility] ENDOSCOPY;  Service: Endoscopy;  Laterality: N/A;   FRACTURE SURGERY Left 1997   arm   XI ROBOTIC ASSISTED INGUINAL HERNIA REPAIR WITH MESH Right 08/23/2021   Procedure: XI ROBOTIC ASSISTED INGUINAL HERNIA REPAIR WITH MESH;  Surgeon: Jules Husbands, MD;  Location: [facility] ORS;  Service: General;  Laterality: Right;    History reviewed. No pertinent family history.  Allergies  Allergen Reactions   Cefuroxime Rash    TOLERATED CEFAZOLIN   Tramadol Nausea Only and Other (See Comments)    Dizziness       Latest Ref Rng & Units 11/08/2021   10:42 AM 11/01/2021   10:54 AM 08/23/2021    6:52 AM  CBC  WBC 4.0 - 10.5 K/uL  6.8    Hemoglobin 13.0 - 17.0 g/dL 12.9  11.5  8.2   Hematocrit 39.0 - 52.0 % 38.0  37.7  24.0   Platelets 150 - 400 K/uL  222        CMP     Component Value Date/Time   NA 138 11/08/2021 1042   K 4.6 11/08/2021 1042   CL 100 11/08/2021 1042   CO2 28 11/01/2021 1054   GLUCOSE 93 11/08/2021 1042   BUN 29 (H) 11/08/2021 1042   CREATININE 9.30 (H) 11/08/2021 1042   CALCIUM 8.4 (L) 11/01/2021 1054   GFRNONAA 7 (L) 11/01/2021 1054     No results found.     Assessment & Plan:   1. End stage renal disease  ([place]) Recommend:  At this time the patient does not have appropriate extremity access for dialysis  Patient should have a left brachial axillary graft created.  The risks, benefits and alternative therapies were reviewed in detail with the patient.  All questions  were answered.  The patient agrees to proceed with surgery.   The patient will follow up with me in the office after the surgery.   2. Hypercholesterolemia Continue statin as ordered and reviewed, no changes at this time   3. Benign essential hypertension Continue antihypertensive medications as already ordered, these medications have been reviewed and there are no changes at this time.  The patient does have elevated blood pressure to be notes that he did not take his medications.  He is advised to take them as soon as he returns home.   Current Outpatient Medications on File Prior to Visit  Medication Sig Dispense Refill   amLODipine (NORVASC) 5 MG tablet Take 5 mg by mouth every morning.     atorvastatin (LIPITOR) 10 MG tablet Take 10 mg by mouth every morning.     calcitRIOL (ROCALTROL) 0.25 MCG capsule Take 0.25 mcg by mouth daily.     Cholecalciferol 5000 units TABS Take 5,000 Units by mouth once a week.     dorzolamide-timolol (COSOPT) 22.3-6.8 MG/ML ophthalmic solution Place 1 drop into both eyes 2 (two) times daily.     fluticasone (FLONASE) 50 MCG/ACT nasal spray Place 2 sprays into both nostrils daily as needed for allergies or rhinitis.     furosemide (LASIX) 80 MG tablet Take 80 mg by mouth daily.     HYDROcodone-acetaminophen (NORCO/VICODIN) 5-325 MG tablet Take 1-2 tablets by mouth every 6 (six) hours as needed for moderate pain. 12 tablet 0   lactulose (CHRONULAC) 10 GM/15ML solution Take 20 g by mouth 2 (two) times daily.     losartan (COZAAR) 50 MG tablet Take 50 mg by mouth at bedtime.     metoprolol tartrate (LOPRESSOR) 25 MG tablet Take 25 mg by mouth every morning.     Netarsudil-Latanoprost (ROCKLATAN)  0.02-0.005 % SOLN Place 1 drop into both eyes every evening.     pantoprazole (PROTONIX) 20 MG tablet Take 20 mg by mouth every morning.     potassium chloride SA (KLOR-CON) 20 MEQ tablet Take 1 tablet (20 mEq total) by mouth 2 (two) times daily. 10 tablet 0   esomeprazole (NEXIUM) 40 MG capsule Take 40 mg by mouth 2 (two) times daily. (Patient not taking: Reported on 11/08/2021)     No current facility-administered medications on file prior to visit.    There are no Patient Instructions on file for this visit. No follow-ups on file.   Kris Hartmann, NP

## 2021-11-21 ENCOUNTER — Telehealth (INDEPENDENT_AMBULATORY_CARE_PROVIDER_SITE_OTHER): Payer: Self-pay

## 2021-11-21 DIAGNOSIS — N2581 Secondary hyperparathyroidism of renal origin: Secondary | ICD-10-CM | POA: Diagnosis not present

## 2021-11-21 DIAGNOSIS — Z992 Dependence on renal dialysis: Secondary | ICD-10-CM | POA: Diagnosis not present

## 2021-11-21 DIAGNOSIS — N186 End stage renal disease: Secondary | ICD-10-CM | POA: Diagnosis not present

## 2021-11-21 NOTE — Telephone Encounter (Signed)
Spoke with the patient's niece and he is scheduled with Dr. Lucky Cowboy on 12/06/21 for a left brachial axillary graft at the [facility]. Pre-op phone call is on 11/29/21 between 1-5 pm. Pre-surgical instructions were discussed and will be mailed.

## 2021-11-23 DIAGNOSIS — Z992 Dependence on renal dialysis: Secondary | ICD-10-CM | POA: Diagnosis not present

## 2021-11-23 DIAGNOSIS — N186 End stage renal disease: Secondary | ICD-10-CM | POA: Diagnosis not present

## 2021-11-23 DIAGNOSIS — N2581 Secondary hyperparathyroidism of renal origin: Secondary | ICD-10-CM | POA: Diagnosis not present

## 2021-11-24 DIAGNOSIS — N2581 Secondary hyperparathyroidism of renal origin: Secondary | ICD-10-CM | POA: Diagnosis not present

## 2021-11-24 DIAGNOSIS — N186 End stage renal disease: Secondary | ICD-10-CM | POA: Diagnosis not present

## 2021-11-24 DIAGNOSIS — Z992 Dependence on renal dialysis: Secondary | ICD-10-CM | POA: Diagnosis not present

## 2021-11-26 DIAGNOSIS — N2581 Secondary hyperparathyroidism of renal origin: Secondary | ICD-10-CM | POA: Diagnosis not present

## 2021-11-26 DIAGNOSIS — Z992 Dependence on renal dialysis: Secondary | ICD-10-CM | POA: Diagnosis not present

## 2021-11-26 DIAGNOSIS — N186 End stage renal disease: Secondary | ICD-10-CM | POA: Diagnosis not present

## 2021-11-28 DIAGNOSIS — Z992 Dependence on renal dialysis: Secondary | ICD-10-CM | POA: Diagnosis not present

## 2021-11-28 DIAGNOSIS — N186 End stage renal disease: Secondary | ICD-10-CM | POA: Diagnosis not present

## 2021-11-28 DIAGNOSIS — N2581 Secondary hyperparathyroidism of renal origin: Secondary | ICD-10-CM | POA: Diagnosis not present

## 2021-11-29 ENCOUNTER — Encounter
Admission: RE | Admit: 2021-11-29 | Discharge: 2021-11-29 | Disposition: A | Discharge status: Home / Self Care | Payer: Medicare HMO | Source: Ambulatory Visit | Attending: Vascular Surgery | Admitting: Vascular Surgery

## 2021-11-29 ENCOUNTER — Other Ambulatory Visit (INDEPENDENT_AMBULATORY_CARE_PROVIDER_SITE_OTHER): Payer: Self-pay | Admitting: Nurse Practitioner

## 2021-11-29 ENCOUNTER — Other Ambulatory Visit: Payer: Self-pay

## 2021-11-29 DIAGNOSIS — Z01812 Encounter for preprocedural laboratory examination: Secondary | ICD-10-CM

## 2021-11-29 DIAGNOSIS — N186 End stage renal disease: Secondary | ICD-10-CM

## 2021-11-29 DIAGNOSIS — D631 Anemia in chronic kidney disease: Secondary | ICD-10-CM

## 2021-11-29 NOTE — Patient Instructions (Addendum)
Your procedure is scheduled on: 12/06/21 - Thursday Report to the Registration Desk on the 1st floor of the [facility]. To find out your arrival time, please call [phone] between 1PM - 3PM on: 12/05/21 - Wednesday If your arrival time is 6:00 am, do not arrive prior to that time as the [facility] entrance doors do not open until 6:00 am.  REMEMBER: Instructions that are not followed completely may result in serious medical risk, up to and including death; or upon the discretion of your surgeon and anesthesiologist your surgery may need to be rescheduled.  Do not eat food or drink any fluids after midnight the night before surgery.  No gum chewing, lozengers or hard candies.   TAKE ONLY THESE MEDICATIONS THE MORNING OF SURGERY WITH A SIP OF WATER:   - pantoprazole (PROTONIX) 20 MG tablet, (take one the night before and one on the morning of surgery - helps to prevent nausea after surgery.) - amLODipine (NORVASC) 5 MG tablet - atorvastatin (LIPITOR) 10 MG tablet - dorzolamide-timolol (COSOPT)  - metoprolol tartrate (LOPRESSOR) 25 MG  - potassium chloride SA (KLOR-CON) 20 MEQ tablet  One week prior to surgery: Stop Anti-inflammatories (NSAIDS) such as Advil, Aleve, Ibuprofen, Motrin, Naproxen, Naprosyn and Aspirin based products such as Excedrin, Goodys Powder, BC Powder.  Stop ANY OVER THE COUNTER supplements until after surgery.  No Alcohol for 24 hours before or after surgery.  No Smoking including e-cigarettes for 24 hours prior to surgery.  No chewable tobacco products for at least 6 hours prior to surgery.  No nicotine patches on the day of surgery.  Do not use any "recreational" drugs for at least a week prior to your surgery.  Please be advised that the combination of cocaine and anesthesia may have negative outcomes, up to and including death. If you test positive for cocaine, your surgery will be cancelled.  On the morning of surgery brush your teeth with  toothpaste and water, you may rinse your mouth with mouthwash if you wish. Do not swallow any toothpaste or mouthwash.  Do not wear jewelry, make-up, hairpins, clips or nail polish.  Do not wear lotions, powders, or perfumes.   Do not shave body from the neck down 48 hours prior to surgery just in case you cut yourself which could leave a site for infection.  Also, freshly shaved skin may become irritated if using the CHG soap.  Contact lenses, hearing aids and dentures may not be worn into surgery.  Do not bring valuables to the hospital. [facility] is not responsible for any missing/lost belongings or valuables.   Notify your doctor if there is any change in your medical condition (cold, fever, infection).  Wear comfortable clothing (specific to your surgery type) to the hospital.  After surgery, you can help prevent lung complications by doing breathing exercises.  Take deep breaths and cough every 1-2 hours. Your doctor may order a device called an Incentive Spirometer to help you take deep breaths. When coughing or sneezing, hold a pillow firmly against your incision with both hands. This is called "splinting." Doing this helps protect your incision. It also decreases belly discomfort.  If you are being admitted to the hospital overnight, leave your suitcase in the car. After surgery it may be brought to your room.  If you are being discharged the day of surgery, you will not be allowed to drive home. You will need a responsible adult (18 years or older) to drive you home  and stay with you that night.   If you are taking public transportation, you will need to have a responsible adult (18 years or older) with you. Please confirm with your physician that it is acceptable to use public transportation.   Please call the [facility] at [phone] if you have any questions about these instructions.  Surgery Visitation Policy:  Patients undergoing a surgery or  procedure may have two family members or support persons with them as long as the person is not COVID-19 positive or experiencing its symptoms.   Inpatient Visitation:    Visiting hours are 7 a.m. to 8 p.m. Up to four visitors are allowed at one time in a patient room, including children. The visitors may rotate out with other people during the day. One designated support person (adult) may remain overnight.

## 2021-11-30 DIAGNOSIS — Z131 Encounter for screening for diabetes mellitus: Secondary | ICD-10-CM | POA: Diagnosis not present

## 2021-11-30 DIAGNOSIS — Z992 Dependence on renal dialysis: Secondary | ICD-10-CM | POA: Diagnosis not present

## 2021-11-30 DIAGNOSIS — N186 End stage renal disease: Secondary | ICD-10-CM | POA: Diagnosis not present

## 2021-11-30 DIAGNOSIS — N2581 Secondary hyperparathyroidism of renal origin: Secondary | ICD-10-CM | POA: Diagnosis not present

## 2021-12-03 DIAGNOSIS — N2581 Secondary hyperparathyroidism of renal origin: Secondary | ICD-10-CM | POA: Diagnosis not present

## 2021-12-03 DIAGNOSIS — N186 End stage renal disease: Secondary | ICD-10-CM | POA: Diagnosis not present

## 2021-12-03 DIAGNOSIS — Z992 Dependence on renal dialysis: Secondary | ICD-10-CM | POA: Diagnosis not present

## 2021-12-05 DIAGNOSIS — N186 End stage renal disease: Secondary | ICD-10-CM | POA: Diagnosis not present

## 2021-12-05 DIAGNOSIS — Z992 Dependence on renal dialysis: Secondary | ICD-10-CM | POA: Diagnosis not present

## 2021-12-05 DIAGNOSIS — N2581 Secondary hyperparathyroidism of renal origin: Secondary | ICD-10-CM | POA: Diagnosis not present

## 2021-12-06 ENCOUNTER — Other Ambulatory Visit: Payer: Self-pay

## 2021-12-06 ENCOUNTER — Ambulatory Visit: Payer: Medicare HMO | Admitting: Anesthesiology

## 2021-12-06 ENCOUNTER — Encounter
Admission: RE | Disposition: A | Discharge status: Home / Self Care | Payer: Self-pay | Source: Home / Self Care | Attending: Vascular Surgery

## 2021-12-06 ENCOUNTER — Ambulatory Visit
Admission: RE | Admit: 2021-12-06 | Discharge: 2021-12-06 | Disposition: A | Discharge status: Home / Self Care | Payer: Medicare HMO | Attending: Vascular Surgery | Admitting: Vascular Surgery

## 2021-12-06 ENCOUNTER — Encounter: Payer: Self-pay | Admitting: Vascular Surgery

## 2021-12-06 DIAGNOSIS — E1122 Type 2 diabetes mellitus with diabetic chronic kidney disease: Secondary | ICD-10-CM | POA: Diagnosis not present

## 2021-12-06 DIAGNOSIS — E7800 Pure hypercholesterolemia, unspecified: Secondary | ICD-10-CM | POA: Insufficient documentation

## 2021-12-06 DIAGNOSIS — I120 Hypertensive chronic kidney disease with stage 5 chronic kidney disease or end stage renal disease: Secondary | ICD-10-CM | POA: Insufficient documentation

## 2021-12-06 DIAGNOSIS — N186 End stage renal disease: Secondary | ICD-10-CM | POA: Diagnosis not present

## 2021-12-06 DIAGNOSIS — K219 Gastro-esophageal reflux disease without esophagitis: Secondary | ICD-10-CM | POA: Insufficient documentation

## 2021-12-06 DIAGNOSIS — D631 Anemia in chronic kidney disease: Secondary | ICD-10-CM

## 2021-12-06 DIAGNOSIS — Z992 Dependence on renal dialysis: Secondary | ICD-10-CM | POA: Diagnosis not present

## 2021-12-06 DIAGNOSIS — Z79899 Other long term (current) drug therapy: Secondary | ICD-10-CM | POA: Diagnosis not present

## 2021-12-06 DIAGNOSIS — Z01810 Encounter for preprocedural cardiovascular examination: Secondary | ICD-10-CM | POA: Diagnosis not present

## 2021-12-06 DIAGNOSIS — Z01812 Encounter for preprocedural laboratory examination: Secondary | ICD-10-CM

## 2021-12-06 HISTORY — PX: AV FISTULA PLACEMENT: SHX1204

## 2021-12-06 LAB — POTASSIUM: Potassium: 3.9 mmol/L (ref 3.5–5.1)

## 2021-12-06 LAB — HEMOGLOBIN: Hemoglobin: 12.8 g/dL — ABNORMAL LOW (ref 13.0–17.0)

## 2021-12-06 LAB — ABO/RH: ABO/RH(D): O POS

## 2021-12-06 LAB — PROTIME-INR
INR: 1.1 (ref 0.8–1.2)
Prothrombin Time: 13.8 seconds (ref 11.4–15.2)

## 2021-12-06 LAB — TYPE AND SCREEN
ABO/RH(D): O POS
Antibody Screen: NEGATIVE

## 2021-12-06 SURGERY — INSERTION OF ARTERIOVENOUS (AV) GORE-TEX GRAFT ARM
Anesthesia: General | Laterality: Left

## 2021-12-06 MED ORDER — LABETALOL HCL 5 MG/ML IV SOLN: 10.0000 mg | Freq: Once | INTRAVENOUS | Status: AC

## 2021-12-06 MED ORDER — CHLORHEXIDINE GLUCONATE 0.12 % MT SOLN
15.0000 mL | Freq: Once | OROMUCOSAL | Status: AC
  Administered 2021-12-06: 15 mL via OROMUCOSAL

## 2021-12-06 MED ORDER — EPHEDRINE SULFATE (PRESSORS) 50 MG/ML IJ SOLN
INTRAMUSCULAR | Status: DC | PRN
  Administered 2021-12-06: 5 mg via INTRAVENOUS

## 2021-12-06 MED ORDER — LIDOCAINE HCL (PF) 2 % IJ SOLN
INTRAMUSCULAR | Status: AC
  Filled 2021-12-06: qty 5

## 2021-12-06 MED ORDER — HEPARIN SODIUM (PORCINE) 5000 UNIT/ML IJ SOLN
INTRAMUSCULAR | Status: AC
  Filled 2021-12-06: qty 1

## 2021-12-06 MED ORDER — HYDROMORPHONE HCL 1 MG/ML IJ SOLN: 1.0000 mg | Freq: Once | INTRAMUSCULAR | Status: DC | PRN

## 2021-12-06 MED ORDER — PHENYLEPHRINE HCL (PRESSORS) 10 MG/ML IV SOLN
INTRAVENOUS | Status: DC | PRN
  Administered 2021-12-06 (×2): 160 ug via INTRAVENOUS

## 2021-12-06 MED ORDER — SUCCINYLCHOLINE CHLORIDE 200 MG/10ML IV SOSY
PREFILLED_SYRINGE | INTRAVENOUS | Status: DC | PRN
  Administered 2021-12-06: 120 mg via INTRAVENOUS

## 2021-12-06 MED ORDER — HEPARIN 5000 UNITS IN NS 1000 ML (FLUSH)
INTRAMUSCULAR | Status: DC | PRN
  Administered 2021-12-06: 200 mL via INTRAMUSCULAR

## 2021-12-06 MED ORDER — "VISTASEAL 4 ML SINGLE DOSE KIT "
PACK | CUTANEOUS | Status: DC | PRN
  Administered 2021-12-06: 4 mL via TOPICAL

## 2021-12-06 MED ORDER — ROCURONIUM BROMIDE 100 MG/10ML IV SOLN
INTRAVENOUS | Status: DC | PRN
  Administered 2021-12-06: 50 mg via INTRAVENOUS

## 2021-12-06 MED ORDER — SUGAMMADEX SODIUM 200 MG/2ML IV SOLN
INTRAVENOUS | Status: DC | PRN
  Administered 2021-12-06: 200 mg via INTRAVENOUS

## 2021-12-06 MED ORDER — VANCOMYCIN HCL IN DEXTROSE 1-5 GM/200ML-% IV SOLN
1000.0000 mg | INTRAVENOUS | Status: AC
  Administered 2021-12-06: 1000 mg via INTRAVENOUS

## 2021-12-06 MED ORDER — SODIUM CHLORIDE 0.9 % IV SOLN: INTRAVENOUS | Status: DC

## 2021-12-06 MED ORDER — FENTANYL CITRATE (PF) 100 MCG/2ML IJ SOLN
INTRAMUSCULAR | Status: DC | PRN
Start: 2021-12-06 — End: 2021-12-06
  Administered 2021-12-06: 50 ug via EPIDURAL

## 2021-12-06 MED ORDER — FENTANYL CITRATE (PF) 100 MCG/2ML IJ SOLN: 25.0000 ug | INTRAMUSCULAR | Status: DC | PRN

## 2021-12-06 MED ORDER — VANCOMYCIN HCL IN DEXTROSE 1-5 GM/200ML-% IV SOLN
INTRAVENOUS | Status: AC
  Filled 2021-12-06: qty 200

## 2021-12-06 MED ORDER — FENTANYL CITRATE (PF) 100 MCG/2ML IJ SOLN
INTRAMUSCULAR | Status: AC
  Filled 2021-12-06: qty 2

## 2021-12-06 MED ORDER — FENTANYL CITRATE (PF) 100 MCG/2ML IJ SOLN
INTRAMUSCULAR | Status: AC
  Administered 2021-12-06: 25 ug via INTRAVENOUS
  Filled 2021-12-06: qty 2

## 2021-12-06 MED ORDER — HEPARIN SODIUM (PORCINE) 5000 UNIT/ML IJ SOLN
INTRAMUSCULAR | Status: DC | PRN
  Administered 2021-12-06: 3000 [IU] via SUBCUTANEOUS

## 2021-12-06 MED ORDER — CHLORHEXIDINE GLUCONATE 0.12 % MT SOLN
OROMUCOSAL | Status: AC
  Filled 2021-12-06: qty 15

## 2021-12-06 MED ORDER — CHLORHEXIDINE GLUCONATE CLOTH 2 % EX PADS
6.0000 | MEDICATED_PAD | Freq: Once | CUTANEOUS | Status: AC
  Administered 2021-12-06: 6 via TOPICAL

## 2021-12-06 MED ORDER — FENTANYL CITRATE (PF) 100 MCG/2ML IJ SOLN
INTRAMUSCULAR | Status: DC | PRN
  Administered 2021-12-06 (×3): 50 ug via INTRAVENOUS

## 2021-12-06 MED ORDER — ONDANSETRON HCL 4 MG/2ML IJ SOLN
INTRAMUSCULAR | Status: DC | PRN
  Administered 2021-12-06: 4 mg via INTRAVENOUS

## 2021-12-06 MED ORDER — PHENYLEPHRINE HCL-NACL 20-0.9 MG/250ML-% IV SOLN
INTRAVENOUS | Status: DC | PRN
  Administered 2021-12-06: 50 ug/min via INTRAVENOUS

## 2021-12-06 MED ORDER — PROPOFOL 10 MG/ML IV BOLUS
INTRAVENOUS | Status: AC
  Filled 2021-12-06: qty 20

## 2021-12-06 MED ORDER — ORAL CARE MOUTH RINSE: 15.0000 mL | Freq: Once | OROMUCOSAL | Status: AC

## 2021-12-06 MED ORDER — HEMOSTATIC AGENTS (NO CHARGE) OPTIME
TOPICAL | Status: DC | PRN
  Administered 2021-12-06: 1 via TOPICAL

## 2021-12-06 MED ORDER — ACETAMINOPHEN 10 MG/ML IV SOLN
INTRAVENOUS | Status: AC
  Filled 2021-12-06: qty 100

## 2021-12-06 MED ORDER — PROPOFOL 10 MG/ML IV BOLUS
INTRAVENOUS | Status: DC | PRN
  Administered 2021-12-06: 50 mg via INTRAVENOUS
  Administered 2021-12-06: 150 mg via INTRAVENOUS

## 2021-12-06 MED ORDER — LIDOCAINE HCL (CARDIAC) PF 100 MG/5ML IV SOSY
PREFILLED_SYRINGE | INTRAVENOUS | Status: DC | PRN
  Administered 2021-12-06: 100 mg via INTRAVENOUS

## 2021-12-06 MED ORDER — ONDANSETRON HCL 4 MG/2ML IJ SOLN
4.0000 mg | Freq: Once | INTRAMUSCULAR | Status: DC | PRN
Start: 2021-12-06 — End: 2021-12-06

## 2021-12-06 MED ORDER — LABETALOL HCL 5 MG/ML IV SOLN
INTRAVENOUS | Status: AC
  Administered 2021-12-06: 10 mg via INTRAVENOUS
  Filled 2021-12-06: qty 4

## 2021-12-06 MED ORDER — ACETAMINOPHEN 10 MG/ML IV SOLN
INTRAVENOUS | Status: DC | PRN
  Administered 2021-12-06: 1000 mg via INTRAVENOUS

## 2021-12-06 MED ORDER — ONDANSETRON HCL 4 MG/2ML IJ SOLN: 4.0000 mg | Freq: Four times a day (QID) | INTRAMUSCULAR | Status: DC | PRN

## 2021-12-06 MED ORDER — DEXAMETHASONE SODIUM PHOSPHATE 10 MG/ML IJ SOLN
INTRAMUSCULAR | Status: DC | PRN
  Administered 2021-12-06: 10 mg via INTRAVENOUS

## 2021-12-06 SURGICAL SUPPLY — 54 items
ADH SKN CLS APL DERMABOND .7 (GAUZE/BANDAGES/DRESSINGS) ×2
APL PRP STRL LF DISP 70% ISPRP (MISCELLANEOUS) ×1
BAG DECANTER FOR FLEXI CONT (MISCELLANEOUS) ×2 IMPLANT
BLADE SURG SZ11 CARB STEEL (BLADE) ×2 IMPLANT
BOOT SUTURE AID YELLOW STND (SUTURE) ×2 IMPLANT
BRUSH SCRUB EZ  4% CHG (MISCELLANEOUS) ×1
BRUSH SCRUB EZ 4% CHG (MISCELLANEOUS) ×1 IMPLANT
CHLORAPREP W/TINT 26 (MISCELLANEOUS) ×2 IMPLANT
CLIP SPRNG 6 S-JAW DBL (CLIP) ×1 IMPLANT
CLIP SPRNG 6MM S-JAW DBL (CLIP) ×2
DERMABOND ADVANCED (GAUZE/BANDAGES/DRESSINGS) ×2
DERMABOND ADVANCED .7 DNX12 (GAUZE/BANDAGES/DRESSINGS) ×1 IMPLANT
ELECT CAUTERY BLADE 6.4 (BLADE) ×2 IMPLANT
ELECT REM PT RETURN 9FT ADLT (ELECTROSURGICAL) ×2
ELECTRODE REM PT RTRN 9FT ADLT (ELECTROSURGICAL) ×1 IMPLANT
GLOVE BIO SURGEON STRL SZ7 (GLOVE) ×2 IMPLANT
GOWN STRL REUS W/ TWL LRG LVL3 (GOWN DISPOSABLE) ×1 IMPLANT
GOWN STRL REUS W/ TWL XL LVL3 (GOWN DISPOSABLE) ×1 IMPLANT
GOWN STRL REUS W/TWL LRG LVL3 (GOWN DISPOSABLE) ×2
GOWN STRL REUS W/TWL XL LVL3 (GOWN DISPOSABLE) ×2
GRAFT PROPATEN STD WALL 6X40 (Vascular Products) ×1 IMPLANT
HEMOSTAT SURGICEL 2X3 (HEMOSTASIS) ×2 IMPLANT
IV NS 500ML (IV SOLUTION) ×2
IV NS 500ML BAXH (IV SOLUTION) ×1 IMPLANT
KIT TURNOVER KIT A (KITS) ×2 IMPLANT
LABEL OR SOLS (LABEL) ×2 IMPLANT
LOOP RED MAXI  1X406MM (MISCELLANEOUS) ×1
LOOP VESSEL MAXI 1X406 RED (MISCELLANEOUS) ×1 IMPLANT
LOOP VESSEL MINI 0.8X406 BLUE (MISCELLANEOUS) ×1 IMPLANT
LOOPS BLUE MINI 0.8X406MM (MISCELLANEOUS) ×1
MANIFOLD NEPTUNE II (INSTRUMENTS) ×2 IMPLANT
NDL FILTER BLUNT 18X1 1/2 (NEEDLE) ×1 IMPLANT
NEEDLE FILTER BLUNT 18X 1/2SAF (NEEDLE) ×1
NEEDLE FILTER BLUNT 18X1 1/2 (NEEDLE) ×1 IMPLANT
PACK EXTREMITY ARMC (MISCELLANEOUS) ×2 IMPLANT
PAD PREP 24X41 OB/GYN DISP (PERSONAL CARE ITEMS) ×2 IMPLANT
SOLUTION CELL SAVER (CLIP) ×1 IMPLANT
SPIKE FLUID TRANSFER (MISCELLANEOUS) ×2 IMPLANT
STOCKINETTE 48X4 2 PLY STRL (GAUZE/BANDAGES/DRESSINGS) ×1 IMPLANT
STOCKINETTE STRL 4IN 9604848 (GAUZE/BANDAGES/DRESSINGS) ×2 IMPLANT
SUT GORETEX CV-6TTC-13 36IN (SUTURE) ×2 IMPLANT
SUT MNCRL AB 4-0 PS2 18 (SUTURE) ×2 IMPLANT
SUT PROLENE 6 0 BV (SUTURE) ×8 IMPLANT
SUT SILK 0 SH 30 (SUTURE) ×2 IMPLANT
SUT SILK 2 0 (SUTURE) ×2
SUT SILK 2-0 18XBRD TIE 12 (SUTURE) ×1 IMPLANT
SUT SILK 3 0 (SUTURE) ×2
SUT SILK 3-0 18XBRD TIE 12 (SUTURE) ×1 IMPLANT
SUT SILK 4 0 (SUTURE) ×2
SUT SILK 4-0 18XBRD TIE 12 (SUTURE) ×1 IMPLANT
SUT VIC AB 3-0 SH 27 (SUTURE) ×2
SUT VIC AB 3-0 SH 27X BRD (SUTURE) ×1 IMPLANT
SYR 20ML LL LF (SYRINGE) ×2 IMPLANT
SYR 3ML LL SCALE MARK (SYRINGE) ×2 IMPLANT

## 2021-12-06 NOTE — Anesthesia Preprocedure Evaluation (Signed)
Anesthesia Evaluation  Patient identified by MRN, date of birth, ID band Patient awake    Reviewed: Allergy & Precautions, NPO status , Patient's Chart, lab work & pertinent test results  History of Anesthesia Complications Negative for: history of anesthetic complications  Airway Mallampati: III  TM Distance: >3 FB Neck ROM: Full    Dental no notable dental hx. (+) Teeth Intact   Pulmonary neg pulmonary ROS, neg shortness of breath, neg sleep apnea, neg COPD, neg recent URI, Patient abstained from smoking.Not current smoker,    Pulmonary exam normal breath sounds clear to auscultation       Cardiovascular Exercise Tolerance: Good METShypertension, Pt. on medications (-) angina(-) CAD and (-) Past MI Normal cardiovascular exam(-) dysrhythmias  Rhythm:Regular Rate:Normal  ECG 08/11/20: normal   Neuro/Psych Cerebral palsy negative neurological ROS  negative psych ROS   GI/Hepatic GERD  Controlled and Medicated,(+)     (-) substance abuse  ,   Endo/Other  negative endocrine ROSneg diabetes  Renal/GU ESRF and DialysisRenal disease (last HD 08/15/21)     Musculoskeletal  (+) Arthritis , Osteoarthritis,    Abdominal Normal abdominal exam  (+)   Peds  Hematology  (+) Blood dyscrasia, anemia ,   Anesthesia Other Findings Past Medical History: No date: Anemia No date: Arthritis No date: Cerebral palsy (HCC) No date: DDD (degenerative disc disease), lumbar No date: Deficiency of vitamin B12 No date: Encephalitis No date: ESRD (end stage renal disease) on dialysis (HCC)     Comment:  M-W-F No date: GERD (gastroesophageal reflux disease) No date: Hemorrhoid No date: Hypercholesterolemia No date: Hyperkalemia No date: Hypertension No date: PONV (postoperative nausea and vomiting)     Comment:  n/v after 08-16-21 capd revision No date: Tremor No date: Tubular adenoma of colon No date: Vitamin D deficiency   Reproductive/Obstetrics                             Anesthesia Physical  Anesthesia Plan  ASA: 3  Anesthesia Plan: General   Post-op Pain Management: Ofirmev IV (intra-op)*, Celebrex PO (pre-op)* and Gabapentin PO (pre-op)*   Induction: Intravenous  PONV Risk Score and Plan: 4 or greater and Ondansetron, Dexamethasone, Treatment may vary due to age or medical condition and Midazolam  Airway Management Planned: LMA  Additional Equipment: None  Intra-op Plan:   Post-operative Plan: Extubation in OR  Informed Consent: I have reviewed the patients History and Physical, chart, labs and discussed the procedure including the risks, benefits and alternatives for the proposed anesthesia with the patient or authorized representative who has indicated his/her understanding and acceptance.     Dental advisory given  Plan Discussed with: CRNA  Anesthesia Plan Comments: (Discussed risks of anesthesia with patient, including PONV, sore throat, lip/dental/eye damage. Rare risks discussed as well, such as cardiorespiratory and neurological sequelae, and allergic reactions. Discussed the role of CRNA in patient's perioperative care. Patient understands.)        Anesthesia Quick Evaluation

## 2021-12-06 NOTE — Interval H&P Note (Signed)
History and Physical Interval Note:  12/06/2021 2:48 PM  James Black  has presented today for surgery, with the diagnosis of ESRD.  The various methods of treatment have been discussed with the patient and family. After consideration of risks, benefits and other options for treatment, the patient has consented to  Procedure(s): INSERTION OF ARTERIOVENOUS (AV) GORE-TEX GRAFT ARM (BRACHIAL AXILLARY) (Left) as a surgical intervention.  The patient's history has been reviewed, patient examined, no change in status, stable for surgery.  I have reviewed the patient's chart and labs.  Questions were answered to the patient's satisfaction.     [name]

## 2021-12-06 NOTE — Op Note (Signed)
[facility]  OPERATIVE NOTE   PROCEDURE:  Left upper arm brachial artery to axillary vein arteriovenous graft  PRE-OPERATIVE DIAGNOSIS: 1. end stage renal disease    POST-OPERATIVE DIAGNOSIS: same  SURGEON: Imara Standiford  ASSISTANT(S): none  ANESTHESIA: general  ESTIMATED BLOOD LOSS: 20 cc  FINDING(S): none  SPECIMEN(S):  None  INDICATIONS:   James Black is a 66 y.o. male who presents with end stage renal disease and need for access.  Risk, benefits, and alternatives to access surgery were discussed.  The patient is aware the risks include but are not limited to: bleeding, infection, steal syndrome, nerve damage, ischemic monomelic neuropathy, failure to mature, and need for additional procedures.  The patient is aware of the risks and elects to proceed forward.  DESCRIPTION: After full informed written consent was obtained from the patient, the patient was brought back to the operating room and placed supine upon the operating table.  The patient was given IV antibiotics prior to proceeding.  After obtaining adequate sedation, the patient was prepped and draped in standard fashion for a left arm access procedure.  I turned my attention first to the antecubitum.   I made an incision over the brachial artery, and dissected down through the subcutaneous tissue to the fascia carefully and was able to dissect out the brachial artery.  The artery was about patent and of adequate size to support a graft.  It was controlled proximally and distally with vessel loops and then I turned my attention to the high bicipital groove in the axilla.  I made an incision and dissected down through the subcutaneous tissue and fascia until I reached the axillary vein.  It was patent and adequate size for graft creation.  I then dissected this vein proximally and distal and prepared it for control with Bulldog clamps.  I took a Dietitian and dissected from the antecubital up  to the axillary incision.  Then I delivered the 6 mm Propaten pTFE graft, through this metal tunneler and then pulled out the metal tunneler leaving the graft in place and making sure the line was up for orientation.  I then gave the patient 3000 units of heparin to gain some anticoagulation.  After waiting 3 minutes, I placed the brachial artery under tension proximally and distally with vessel loops, made an arteriotomy and extended it with a Potts scissor.  I sewed the graft to this arteriotomy with a running stitch of CV-6 suture.  At this point, then I completed the anastomosis in the usual fashion.  I released the vessel loops on the inflow and allowed the artery to decompress through the graft. There was good pulsatile bleeding through this graft.  I clamped the graft near its arterial anastomosis and sucked out all the blood in the graft and loaded the graft with heparinized saline.  At this point, I pulled the graft to appropriate length and reset my exposure of the axillary vein.  Then, I controlled the vein with Bulldog clamps.  An anterior wall venotomy was created with an 11 blade and extended with Potts scissors.  I then spatulated the graft to facilitate an end-to- side anastomosis matching the arteriotomy.  In the process of spatulating, I cut the graft to appropriate length for this anastomosis.  This graft was sewn to the vein in an end-to-side configuration with a running CV-6 suture.  Prior to completing this anastomosis, I allowed the vein to back bleed and then I also  allowed the artery to bleed in an antegrade fashion.  I completed this anastomosis in the usual fashion, and then irrigated out the high bicipital exposure and then placed Surgicel and Evicel.  I then turned my attention back to the antecubitum.  There was pulsatile flow in the artery beyond the graft.   At this point, I washed out the antecubital incision. Surgicel and Evicel were then placed. There was no more active bleeding.   The subcutaneous tissue was reapproximated with a running stitch of 3-0 Vicryl.  The skin was then reapproximated with a running subcuticular 4-0 Monocryl.  The skin was then cleaned, dried, and Dermabond used to reinforce the skin closure.  We then turned our attention to the axillary incision.  The subcutaneous tissue was repaired with running stitch of 3-0 Vicryl.  The skin was then reapproximated with running subcuticular 4-0 Monocryl.  The skin was then cleaned, dried, and then the skin closure was reinforced with Dermabond.  The patient was then awakened from anesthesia and taken to the recovery room in stable condition having tolerated the procedure well.    COMPLICATIONS: None  CONDITION: Stable   [name] 12/06/2021 4:41 PM   This note was created with Dragon Medical transcription system. Any errors in dictation are purely unintentional.

## 2021-12-06 NOTE — Anesthesia Procedure Notes (Addendum)
Procedure Name: Intubation Date/Time: 12/06/2021 3:09 PM  Performed by: Lowry Bowl, CRNAPre-anesthesia Checklist: Patient identified, Emergency Drugs available, Suction available and Patient being monitored Patient Re-evaluated:Patient Re-evaluated prior to induction Oxygen Delivery Method: Circle system utilized Preoxygenation: Pre-oxygenation with 100% oxygen Induction Type: IV induction Ventilation: Mask ventilation without difficulty Laryngoscope Size: McGraph and 4 Grade View: Grade I Tube type: Oral Tube size: 7.0 mm Number of attempts: 1 Airway Equipment and Method: Stylet and Oral airway Placement Confirmation: ETT inserted through vocal cords under direct vision, positive ETCO2 and breath sounds checked- equal and bilateral Secured at: 21 cm Tube secured with: Tape Dental Injury: Teeth and Oropharynx as per pre-operative assessment  Comments: Placed by Orland Mustard SRNA

## 2021-12-06 NOTE — Anesthesia Procedure Notes (Signed)
Procedure Name: Intubation Date/Time: 12/06/2021 3:06 PM  Performed by: Lowry Bowl, CRNAPre-anesthesia Checklist: Patient identified, Emergency Drugs available, Suction available and Patient being monitored Patient Re-evaluated:Patient Re-evaluated prior to induction Oxygen Delivery Method: Circle system utilized Preoxygenation: Pre-oxygenation with 100% oxygen Induction Type: IV induction LMA: LMA inserted LMA Size: 4.0 Number of attempts: 1 Placement Confirmation: positive ETCO2 and breath sounds checked- equal and bilateral Tube secured with: Tape Dental Injury: Teeth and Oropharynx as per pre-operative assessment  Comments: Placed by Tonna Corner

## 2021-12-06 NOTE — Discharge Instructions (Signed)
AMBULATORY SURGERY  ?DISCHARGE INSTRUCTIONS ? ? ?The drugs that you were given will stay in your system until tomorrow so for the next 24 hours you should not: ? ?Drive an automobile ?Make any legal decisions ?Drink any alcoholic beverage ? ? ?You may resume regular meals tomorrow.  Today it is better to start with liquids and gradually work up to solid foods. ? ?You may eat anything you prefer, but it is better to start with liquids, then soup and crackers, and gradually work up to solid foods. ? ? ?Please notify your doctor immediately if you have any unusual bleeding, trouble breathing, redness and pain at the surgery site, drainage, fever, or pain not relieved by medication. ? ? ? ?Additional Instructions: ? ? ? ?Please contact your physician with any problems or Same Day Surgery at [phone], Monday through Friday 6 am to 4 pm, or [facility] [place] Main number at [phone].  ?

## 2021-12-06 NOTE — Anesthesia Postprocedure Evaluation (Signed)
Anesthesia Post Note  Patient: AVELINO HERREN  Procedure(s) Performed: INSERTION OF ARTERIOVENOUS (AV) GORE-TEX GRAFT ARM (BRACHIAL AXILLARY) (Left)  Patient location during evaluation: PACU Anesthesia Type: General Level of consciousness: awake and alert Pain management: pain level controlled Vital Signs Assessment: post-procedure vital signs reviewed and stable Respiratory status: spontaneous breathing, nonlabored ventilation, respiratory function stable and patient connected to nasal cannula oxygen Cardiovascular status: blood pressure returned to baseline and stable Postop Assessment: no apparent nausea or vomiting Anesthetic complications: no   No notable events documented.   Last Vitals:  Vitals:   12/06/21 1719 12/06/21 1735  BP: (!) 142/84 (!) 159/93  Pulse: 80 80  Resp: 13   Temp:    SpO2: 96%     Last Pain:  Vitals:   12/06/21 1719  TempSrc:   PainSc: 3                  Molli Barrows

## 2021-12-06 NOTE — Transfer of Care (Signed)
Immediate Anesthesia Transfer of Care Note  Patient: James Black  Procedure(s) Performed: INSERTION OF ARTERIOVENOUS (AV) GORE-TEX GRAFT ARM (BRACHIAL AXILLARY) (Left)  Patient Location: PACU  Anesthesia Type:General  Level of Consciousness: awake, drowsy and patient cooperative  Airway & Oxygen Therapy: Patient Spontanous Breathing and Patient connected to face mask oxygen  Post-op Assessment: Report given to RN and Post -op Vital signs reviewed and stable  Post vital signs: Reviewed and stable  Last Vitals:  Vitals Value Taken Time  BP 160/98 12/06/21 1637  Temp    Pulse 101 12/06/21 1640  Resp 21 12/06/21 1640  SpO2 100 % 12/06/21 1640  Vitals shown include unvalidated device data.  Last Pain:  Vitals:   12/06/21 1358  TempSrc: Oral  PainSc: 0-No pain      Patients Stated Pain Goal: 0 (95/18/84 1660)  Complications: No notable events documented.

## 2021-12-07 ENCOUNTER — Encounter: Payer: Self-pay | Admitting: Vascular Surgery

## 2021-12-07 DIAGNOSIS — Z992 Dependence on renal dialysis: Secondary | ICD-10-CM | POA: Diagnosis not present

## 2021-12-07 DIAGNOSIS — N186 End stage renal disease: Secondary | ICD-10-CM | POA: Diagnosis not present

## 2021-12-07 DIAGNOSIS — N2581 Secondary hyperparathyroidism of renal origin: Secondary | ICD-10-CM | POA: Diagnosis not present

## 2021-12-10 DIAGNOSIS — Z992 Dependence on renal dialysis: Secondary | ICD-10-CM | POA: Diagnosis not present

## 2021-12-10 DIAGNOSIS — N186 End stage renal disease: Secondary | ICD-10-CM | POA: Diagnosis not present

## 2021-12-10 DIAGNOSIS — N2581 Secondary hyperparathyroidism of renal origin: Secondary | ICD-10-CM | POA: Diagnosis not present

## 2021-12-11 ENCOUNTER — Inpatient Hospital Stay
Admission: EM | Admit: 2021-12-11 | Discharge: 2021-12-16 | DRG: 177 | Disposition: A | Discharge status: Home / Self Care | Payer: Medicare HMO | Attending: Internal Medicine | Admitting: Internal Medicine

## 2021-12-11 ENCOUNTER — Emergency Department: Payer: Medicare HMO

## 2021-12-11 ENCOUNTER — Other Ambulatory Visit: Payer: Self-pay

## 2021-12-11 ENCOUNTER — Encounter: Payer: Self-pay | Admitting: Medical Oncology

## 2021-12-11 DIAGNOSIS — I248 Other forms of acute ischemic heart disease: Secondary | ICD-10-CM | POA: Diagnosis not present

## 2021-12-11 DIAGNOSIS — E663 Overweight: Secondary | ICD-10-CM | POA: Diagnosis present

## 2021-12-11 DIAGNOSIS — N186 End stage renal disease: Secondary | ICD-10-CM | POA: Diagnosis present

## 2021-12-11 DIAGNOSIS — I700 Atherosclerosis of aorta: Secondary | ICD-10-CM | POA: Diagnosis not present

## 2021-12-11 DIAGNOSIS — E871 Hypo-osmolality and hyponatremia: Secondary | ICD-10-CM | POA: Diagnosis not present

## 2021-12-11 DIAGNOSIS — Z86010 Personal history of colonic polyps: Secondary | ICD-10-CM | POA: Diagnosis not present

## 2021-12-11 DIAGNOSIS — R0602 Shortness of breath: Secondary | ICD-10-CM | POA: Diagnosis not present

## 2021-12-11 DIAGNOSIS — K219 Gastro-esophageal reflux disease without esophagitis: Secondary | ICD-10-CM | POA: Diagnosis present

## 2021-12-11 DIAGNOSIS — I158 Other secondary hypertension: Secondary | ICD-10-CM | POA: Diagnosis present

## 2021-12-11 DIAGNOSIS — E7800 Pure hypercholesterolemia, unspecified: Secondary | ICD-10-CM | POA: Diagnosis present

## 2021-12-11 DIAGNOSIS — Z885 Allergy status to narcotic agent status: Secondary | ICD-10-CM

## 2021-12-11 DIAGNOSIS — J811 Chronic pulmonary edema: Secondary | ICD-10-CM | POA: Diagnosis not present

## 2021-12-11 DIAGNOSIS — M1990 Unspecified osteoarthritis, unspecified site: Secondary | ICD-10-CM | POA: Diagnosis present

## 2021-12-11 DIAGNOSIS — N2581 Secondary hyperparathyroidism of renal origin: Secondary | ICD-10-CM | POA: Diagnosis not present

## 2021-12-11 DIAGNOSIS — Z881 Allergy status to other antibiotic agents status: Secondary | ICD-10-CM

## 2021-12-11 DIAGNOSIS — Z8661 Personal history of infections of the central nervous system: Secondary | ICD-10-CM

## 2021-12-11 DIAGNOSIS — Z79899 Other long term (current) drug therapy: Secondary | ICD-10-CM

## 2021-12-11 DIAGNOSIS — I120 Hypertensive chronic kidney disease with stage 5 chronic kidney disease or end stage renal disease: Secondary | ICD-10-CM | POA: Diagnosis not present

## 2021-12-11 DIAGNOSIS — J90 Pleural effusion, not elsewhere classified: Secondary | ICD-10-CM | POA: Diagnosis not present

## 2021-12-11 DIAGNOSIS — R059 Cough, unspecified: Secondary | ICD-10-CM | POA: Diagnosis not present

## 2021-12-11 DIAGNOSIS — D631 Anemia in chronic kidney disease: Secondary | ICD-10-CM

## 2021-12-11 DIAGNOSIS — R778 Other specified abnormalities of plasma proteins: Secondary | ICD-10-CM | POA: Diagnosis not present

## 2021-12-11 DIAGNOSIS — I2699 Other pulmonary embolism without acute cor pulmonale: Secondary | ICD-10-CM | POA: Diagnosis not present

## 2021-12-11 DIAGNOSIS — I509 Heart failure, unspecified: Secondary | ICD-10-CM | POA: Diagnosis present

## 2021-12-11 DIAGNOSIS — R000 Tachycardia, unspecified: Secondary | ICD-10-CM | POA: Diagnosis not present

## 2021-12-11 DIAGNOSIS — G809 Cerebral palsy, unspecified: Secondary | ICD-10-CM | POA: Diagnosis present

## 2021-12-11 DIAGNOSIS — I110 Hypertensive heart disease with heart failure: Secondary | ICD-10-CM | POA: Diagnosis not present

## 2021-12-11 DIAGNOSIS — E559 Vitamin D deficiency, unspecified: Secondary | ICD-10-CM | POA: Diagnosis present

## 2021-12-11 DIAGNOSIS — I132 Hypertensive heart and chronic kidney disease with heart failure and with stage 5 chronic kidney disease, or end stage renal disease: Secondary | ICD-10-CM | POA: Diagnosis present

## 2021-12-11 DIAGNOSIS — I10 Essential (primary) hypertension: Secondary | ICD-10-CM | POA: Diagnosis not present

## 2021-12-11 DIAGNOSIS — Z992 Dependence on renal dialysis: Secondary | ICD-10-CM | POA: Diagnosis not present

## 2021-12-11 DIAGNOSIS — Z452 Encounter for adjustment and management of vascular access device: Secondary | ICD-10-CM | POA: Diagnosis not present

## 2021-12-11 DIAGNOSIS — I214 Non-ST elevation (NSTEMI) myocardial infarction: Secondary | ICD-10-CM | POA: Diagnosis present

## 2021-12-11 DIAGNOSIS — I2510 Atherosclerotic heart disease of native coronary artery without angina pectoris: Secondary | ICD-10-CM | POA: Diagnosis present

## 2021-12-11 DIAGNOSIS — I2694 Multiple subsegmental pulmonary emboli without acute cor pulmonale: Secondary | ICD-10-CM | POA: Diagnosis not present

## 2021-12-11 DIAGNOSIS — I2609 Other pulmonary embolism with acute cor pulmonale: Secondary | ICD-10-CM | POA: Diagnosis not present

## 2021-12-11 DIAGNOSIS — R7989 Other specified abnormal findings of blood chemistry: Secondary | ICD-10-CM | POA: Diagnosis present

## 2021-12-11 DIAGNOSIS — I491 Atrial premature depolarization: Secondary | ICD-10-CM | POA: Diagnosis not present

## 2021-12-11 DIAGNOSIS — Z6827 Body mass index (BMI) 27.0-27.9, adult: Secondary | ICD-10-CM

## 2021-12-11 DIAGNOSIS — U071 COVID-19: Secondary | ICD-10-CM | POA: Diagnosis present

## 2021-12-11 LAB — BASIC METABOLIC PANEL
Anion gap: 10 (ref 5–15)
BUN: 37 mg/dL — ABNORMAL HIGH (ref 8–23)
CO2: 22 mmol/L (ref 22–32)
Calcium: 8.1 mg/dL — ABNORMAL LOW (ref 8.9–10.3)
Chloride: 104 mmol/L (ref 98–111)
Creatinine, Ser: 9.34 mg/dL — ABNORMAL HIGH (ref 0.61–1.24)
GFR, Estimated: 6 mL/min — ABNORMAL LOW (ref >60)
Glucose, Bld: 111 mg/dL — ABNORMAL HIGH (ref 70–99)
Potassium: 4.4 mmol/L (ref 3.5–5.1)
Sodium: 136 mmol/L (ref 135–145)

## 2021-12-11 LAB — CBC
HCT: 38.4 % — ABNORMAL LOW (ref 39.0–52.0)
Hemoglobin: 12 g/dL — ABNORMAL LOW (ref 13.0–17.0)
MCH: 27.9 pg (ref 26.0–34.0)
MCHC: 31.3 g/dL (ref 30.0–36.0)
MCV: 89.3 fL (ref 80.0–100.0)
Platelets: 215 10*3/uL (ref 150–400)
RBC: 4.3 MIL/uL (ref 4.22–5.81)
RDW: 16.2 % — ABNORMAL HIGH (ref 11.5–15.5)
WBC: 9.8 10*3/uL (ref 4.0–10.5)
nRBC: 0 % (ref 0.0–0.2)

## 2021-12-11 LAB — TROPONIN I (HIGH SENSITIVITY)
Troponin I (High Sensitivity): 64 ng/L — ABNORMAL HIGH (ref <18)
Troponin I (High Sensitivity): 122 ng/L (ref <18)
Troponin I (High Sensitivity): 223 ng/L (ref <18)
Troponin I (High Sensitivity): 249 ng/L (ref <18)
Troponin I (High Sensitivity): 287 ng/L (ref <18)
Troponin I (High Sensitivity): 284 ng/L (ref <18)

## 2021-12-11 LAB — PROTIME-INR
INR: 1.1 (ref 0.8–1.2)
Prothrombin Time: 14.3 seconds (ref 11.4–15.2)

## 2021-12-11 LAB — SARS CORONAVIRUS 2 BY RT PCR: SARS Coronavirus 2 by RT PCR: POSITIVE — AB

## 2021-12-11 LAB — D-DIMER, QUANTITATIVE: D-Dimer, Quant: 2.97 ug/mL-FEU — ABNORMAL HIGH (ref 0.00–0.50)

## 2021-12-11 LAB — GLUCOSE, CAPILLARY: Glucose-Capillary: 134 mg/dL — ABNORMAL HIGH (ref 70–99)

## 2021-12-11 LAB — HIV ANTIBODY (ROUTINE TESTING W REFLEX): HIV Screen 4th Generation wRfx: NONREACTIVE

## 2021-12-11 LAB — APTT: aPTT: 37 seconds — ABNORMAL HIGH (ref 24–36)

## 2021-12-11 LAB — HEPARIN LEVEL (UNFRACTIONATED): Heparin Unfractionated: 0.82 IU/mL — ABNORMAL HIGH (ref 0.30–0.70)

## 2021-12-11 LAB — MRSA NEXT GEN BY PCR, NASAL: MRSA by PCR Next Gen: NOT DETECTED

## 2021-12-11 LAB — BRAIN NATRIURETIC PEPTIDE: B Natriuretic Peptide: 1962.2 pg/mL — ABNORMAL HIGH (ref 0.0–100.0)

## 2021-12-11 MED ORDER — SENNOSIDES-DOCUSATE SODIUM 8.6-50 MG PO TABS: 1.0000 | ORAL_TABLET | Freq: Every evening | ORAL | Status: DC | PRN

## 2021-12-11 MED ORDER — HYDROCODONE-ACETAMINOPHEN 5-325 MG PO TABS: 1.0000 | ORAL_TABLET | Freq: Four times a day (QID) | ORAL | Status: DC | PRN

## 2021-12-11 MED ORDER — IOHEXOL 350 MG/ML SOLN
75.0000 mL | Freq: Once | INTRAVENOUS | Status: AC | PRN
Start: 2021-12-11 — End: 2021-12-11
  Administered 2021-12-11: 75 mL via INTRAVENOUS

## 2021-12-11 MED ORDER — HEPARIN BOLUS VIA INFUSION
5000.0000 [IU] | Freq: Once | INTRAVENOUS | Status: AC
  Administered 2021-12-11: 5000 [IU] via INTRAVENOUS
  Filled 2021-12-11: qty 5000

## 2021-12-11 MED ORDER — CHLORHEXIDINE GLUCONATE CLOTH 2 % EX PADS
6.0000 | MEDICATED_PAD | Freq: Every day | CUTANEOUS | Status: DC
  Administered 2021-12-12 – 2021-12-15 (×4): 6 via TOPICAL

## 2021-12-11 MED ORDER — FLUTICASONE PROPIONATE 50 MCG/ACT NA SUSP
2.0000 | Freq: Every day | NASAL | Status: DC | PRN
Start: 2021-12-11 — End: 2021-12-16
  Filled 2021-12-11: qty 16

## 2021-12-11 MED ORDER — AMLODIPINE BESYLATE 5 MG PO TABS
5.0000 mg | ORAL_TABLET | Freq: Every day | ORAL | Status: DC
  Administered 2021-12-11 – 2021-12-16 (×6): 5 mg via ORAL
  Filled 2021-12-11 (×6): qty 1

## 2021-12-11 MED ORDER — LOSARTAN POTASSIUM 50 MG PO TABS
50.0000 mg | ORAL_TABLET | Freq: Every day | ORAL | Status: DC
Start: 2021-12-11 — End: 2021-12-16
  Administered 2021-12-12 – 2021-12-15 (×4): 50 mg via ORAL
  Filled 2021-12-11 (×5): qty 1

## 2021-12-11 MED ORDER — MELATONIN 5 MG PO TABS
5.0000 mg | ORAL_TABLET | Freq: Every evening | ORAL | Status: DC | PRN
Start: 2021-12-11 — End: 2021-12-16
  Administered 2021-12-13 – 2021-12-14 (×2): 5 mg via ORAL
  Filled 2021-12-11 (×2): qty 1

## 2021-12-11 MED ORDER — GUAIFENESIN ER 600 MG PO TB12
600.0000 mg | ORAL_TABLET | Freq: Two times a day (BID) | ORAL | Status: DC | PRN
Start: 2021-12-11 — End: 2021-12-12

## 2021-12-11 MED ORDER — ONDANSETRON HCL 4 MG PO TABS: 4.0000 mg | ORAL_TABLET | Freq: Four times a day (QID) | ORAL | Status: AC | PRN

## 2021-12-11 MED ORDER — NETARSUDIL-LATANOPROST 0.02-0.005 % OP SOLN
1.0000 [drp] | Freq: Every evening | OPHTHALMIC | Status: DC
Start: 2021-12-11 — End: 2021-12-13

## 2021-12-11 MED ORDER — ATORVASTATIN CALCIUM 10 MG PO TABS
10.0000 mg | ORAL_TABLET | ORAL | Status: DC
  Administered 2021-12-12 – 2021-12-16 (×5): 10 mg via ORAL
  Filled 2021-12-11 (×5): qty 1

## 2021-12-11 MED ORDER — ONDANSETRON HCL 4 MG/2ML IJ SOLN
4.0000 mg | Freq: Four times a day (QID) | INTRAMUSCULAR | Status: AC | PRN
  Administered 2021-12-11 – 2021-12-15 (×2): 4 mg via INTRAVENOUS
  Filled 2021-12-11 (×2): qty 2

## 2021-12-11 MED ORDER — POLYETHYLENE GLYCOL 3350 17 G PO PACK: 17.0000 g | PACK | Freq: Two times a day (BID) | ORAL | Status: DC | PRN

## 2021-12-11 MED ORDER — FUROSEMIDE 10 MG/ML IJ SOLN
40.0000 mg | Freq: Every day | INTRAMUSCULAR | Status: AC
  Administered 2021-12-12: 40 mg via INTRAVENOUS
  Filled 2021-12-11: qty 4

## 2021-12-11 MED ORDER — HYDRALAZINE HCL 20 MG/ML IJ SOLN
5.0000 mg | Freq: Four times a day (QID) | INTRAMUSCULAR | Status: DC | PRN
  Administered 2021-12-11 – 2021-12-12 (×2): 5 mg via INTRAVENOUS
  Filled 2021-12-11 (×2): qty 1

## 2021-12-11 MED ORDER — VITAMIN D 25 MCG (1000 UNIT) PO TABS: 5000.0000 [IU] | ORAL_TABLET | ORAL | Status: DC

## 2021-12-11 MED ORDER — PANTOPRAZOLE SODIUM 20 MG PO TBEC
20.0000 mg | DELAYED_RELEASE_TABLET | Freq: Every day | ORAL | Status: DC
  Administered 2021-12-11 – 2021-12-16 (×6): 20 mg via ORAL
  Filled 2021-12-11 (×6): qty 1

## 2021-12-11 MED ORDER — PANTOPRAZOLE SODIUM 40 MG PO TBEC: 80.0000 mg | DELAYED_RELEASE_TABLET | Freq: Every day | ORAL | Status: DC

## 2021-12-11 MED ORDER — FUROSEMIDE 10 MG/ML IJ SOLN
80.0000 mg | Freq: Once | INTRAMUSCULAR | Status: AC
  Administered 2021-12-11: 80 mg via INTRAVENOUS
  Filled 2021-12-11: qty 8

## 2021-12-11 MED ORDER — HEPARIN (PORCINE) 25000 UT/250ML-% IV SOLN
1150.0000 [IU]/h | INTRAVENOUS | Status: DC
  Administered 2021-12-11: 1300 [IU]/h via INTRAVENOUS
  Administered 2021-12-12: 1150 [IU]/h via INTRAVENOUS
  Filled 2021-12-11 (×2): qty 250

## 2021-12-11 MED ORDER — ASPIRIN 81 MG PO CHEW
324.0000 mg | CHEWABLE_TABLET | Freq: Once | ORAL | Status: AC
  Administered 2021-12-11: 324 mg via ORAL
  Filled 2021-12-11: qty 4

## 2021-12-11 MED ORDER — DORZOLAMIDE HCL-TIMOLOL MAL 2-0.5 % OP SOLN
1.0000 [drp] | Freq: Two times a day (BID) | OPHTHALMIC | Status: DC
  Administered 2021-12-11 – 2021-12-16 (×10): 1 [drp] via OPHTHALMIC
  Filled 2021-12-11: qty 10

## 2021-12-11 MED ORDER — ACETAMINOPHEN 325 MG PO TABS
650.0000 mg | ORAL_TABLET | Freq: Four times a day (QID) | ORAL | Status: AC | PRN
Start: 2021-12-11 — End: 2021-12-16

## 2021-12-11 MED ORDER — MORPHINE SULFATE (PF) 2 MG/ML IV SOLN: 2.0000 mg | INTRAVENOUS | Status: DC | PRN

## 2021-12-11 MED ORDER — METOPROLOL TARTRATE 25 MG PO TABS
25.0000 mg | ORAL_TABLET | Freq: Every day | ORAL | Status: DC
  Administered 2021-12-11 – 2021-12-16 (×6): 25 mg via ORAL
  Filled 2021-12-11 (×6): qty 1

## 2021-12-11 MED ORDER — MOLNUPIRAVIR EUA 200MG CAPSULE
4.0000 | ORAL_CAPSULE | Freq: Two times a day (BID) | ORAL | Status: DC
  Administered 2021-12-12 – 2021-12-16 (×9): 800 mg via ORAL
  Filled 2021-12-11: qty 4

## 2021-12-11 MED ORDER — ACETAMINOPHEN 650 MG RE SUPP: 650.0000 mg | Freq: Four times a day (QID) | RECTAL | Status: AC | PRN

## 2021-12-11 MED ORDER — LACTULOSE 10 GM/15ML PO SOLN
20.0000 g | Freq: Two times a day (BID) | ORAL | Status: DC
  Administered 2021-12-11 – 2021-12-15 (×7): 20 g via ORAL
  Filled 2021-12-11 (×9): qty 30

## 2021-12-11 MED ORDER — HYDROCOD POLI-CHLORPHE POLI ER 10-8 MG/5ML PO SUER
5.0000 mL | Freq: Every evening | ORAL | Status: DC | PRN
  Administered 2021-12-11: 5 mL via ORAL
  Filled 2021-12-11: qty 5

## 2021-12-11 NOTE — H&P (Addendum)
History and Physical   James Black DVV:[id number] DOB: [birth year] DOA: 12/11/2021  PCP: Sofie Hartigan, MD  Outpatient Specialists: Dr. Lucky Cowboy, vascular Patient coming from: Home via EMS  I have personally briefly reviewed patient's old medical records in [place].  Chief Concern: Dyspnea with exertion  HPI: [name] is a 66 year old male with history of hyperlipidemia, hypertension, GERD, end-stage renal disease on hemodialysis, who presents emergency department for chief concerns of dyspnea with exertion.  Initial vitals in the emergency department showed temperature of 98.5, respiration rate of 20, heart rate of 104, blood pressure 176/105, SPO2 of 98% on 2 L nasal cannula  Serum sodium 136, potassium 4.4, chloride 104, bicarb 22, BUN of 37, serum creatinine of 9.34, GFR of 6, nonfasting blood glucose 111, WBC 9.8, hemoglobin 12, platelets of 215.  BNP was elevated at 1962.2. High sensitive troponin was 64 and increased to 122. D-dimer was elevated at 2.97.  CTA of the chest for PE: Was read as 2 small segmental pulmonary emboli, 1 in the anterior segmental branch of the left upper lobe pulmonary artery, and the other in the posterior basal segment right lower lobe branch of the pulmonary artery.  No lobar or greater pulmonary embolus identified.  Borderline cardiomegaly with secondary pulmonary lobular interstitial accentuation, small bilateral pleural effusions and some patchy groundglass opacities favoring acute pulmonary edema. Aortic atherosclerosis.  Left anterior descending coronary artery atherosclerosis.  Borderline low position of the right IJ dialysis catheter tip at the junction of the right atrium and the IVC  ED treatment: Aspirin 324 mg p.o. one-time dose, furosemide 80 mg IV one-time dose  At bedside he is able to tell me his full name, his age, the current calendar year, and he knows he is in the hospital.  He was able to identify his niece at  bedside.  He denies fever, new cough, nausea, vomiting, chest pain, shortness of breath at bedside. He reports the shortness of breath started this morning when he started going on his normal walk this AM. He reports that he walks about 1 mile per day. He denies trauma to her person.   He reports that while in the bed in the ED, he does not feel short of breath or chest pain.  Social history: He lives with his niece, James Black. He denies history of tobacco, etoh, recreational drug use. 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   ROS: Constitutional: no weight change, no fever ENT/Mouth: no sore throat, no rhinorrhea Eyes: no eye pain, no vision changes Cardiovascular: no chest pain, no dyspnea,  no edema, no palpitations Respiratory: no cough, no sputum, no wheezing Gastrointestinal: no nausea, no vomiting, no diarrhea, no constipation Genitourinary: no urinary incontinence, no dysuria, no hematuria Musculoskeletal: no arthralgias, no myalgias Skin: no skin lesions, no pruritus, Neuro: + weakness, no loss of consciousness, no syncope Psych: no anxiety, no depression, no decrease appetite Heme/Lymph: no bruising, no bleeding  ED Course: Discussed with emergency medicine provider, patient requiring hospitalization for chief concerns of bilateral pulmonary embolism.  Assessment/Plan  Principal Problem:   Bilateral pulmonary embolism (HCC) Active Problems:   Benign essential hypertension   Gastro-esophageal reflux disease without esophagitis   Hypercholesterolemia   End stage renal disease (HCC)   Anemia due to chronic kidney disease, on chronic dialysis (HCC)   NSTEMI (non-ST elevated myocardial infarction) ([place])   COVID-19 virus infection   Assessment and Plan:  * Bilateral pulmonary embolism ([place]) -  Presumed secondary to COVID-19 infection - Heparin bolus and GTT ordered per pharmacy - Low clinical suspicion for cardiac ACS involvement as patient denies current  shortness of breath, and chest pain and negative ischemic EKG changes - We will continue to follow high sensitive troponin to ensure downtrending - Discussed with radiology, CTA was negative for right heart strain - However given presentation with bilateral PE and mild sinus tachycardia, I have ordered complete echo - During my physical exam and evaluation of patient, we did a trial off nasal cannula, and his SPO2 remained at 100% on room air - Admit to stepdown, inpatient  COVID-19 virus infection - Patient tested COVID positive - Molnupiravir 800 mg PO, BID ordered for 5 day course - Continue airborne and contact precautions  NSTEMI (non-ST elevated myocardial infarction) ([place]) - I suspect this is currently type II/demand ischemia in setting of bilateral pulmonary embolism - Complete echo ordered - We will continue to follow high sensitive troponin to ensure downtrending - If high since troponin continues to increase, we will consult cardiology for cardiac evaluation - Continue heparin GTT  Anemia due to chronic kidney disease, on chronic dialysis ([place]) - At baseline, CBC in the a.m.  End stage renal disease ([place]) - On hemodialysis MWF - Routine consultation request for nephrology for resumption of hemodialysis via secure chat and Epic order to Dr. Holley Raring - 12/06/21: Insertion of arteriovenous Gore-Tex graft into the left brachial axillary for hemodialysis access  Hypercholesterolemia - Patient takes atorvastatin 10 mg daily  Gastro-esophageal reflux disease without esophagitis - PPI  Benign essential hypertension - Patient takes amlodipine 5 mg daily, losartan 50 mg daily, metoprolol tartrate 25 mg daily resumed - Hydralazine 5 mg IV every 6 hours as needed for SBP greater than 180  Chart reviewed.   DVT prophylaxis: Heparin GGT Code Status: Full code Diet: N.p.o. except for sips of meds Family Communication: Updated niece at bedside with patient's permission Disposition  Plan: Pending clinical course anticipate more than 2 night stay Consults called: Nephrology Admission status: Stepdown, NP  Past Medical History:  Diagnosis Date   Anemia    Arthritis    Cerebral palsy ([place])    DDD (degenerative disc disease), lumbar    Deficiency of vitamin B12    Encephalitis    ESRD (end stage renal disease) on dialysis ([place])    M-W-F   GERD (gastroesophageal reflux disease)    Hemorrhoid    Hypercholesterolemia    Hyperkalemia    Hypertension    PONV (postoperative nausea and vomiting)    n/v after 08-16-21 capd revision   Tremor    Tubular adenoma of colon    Vitamin D deficiency    Past Surgical History:  Procedure Laterality Date   AV FISTULA PLACEMENT Left 12/06/2021   Procedure: INSERTION OF ARTERIOVENOUS (AV) GORE-TEX GRAFT ARM (BRACHIAL AXILLARY);  Surgeon: Algernon Huxley, MD;  Location: [facility] ORS;  Service: Vascular;  Laterality: Left;   CAPD INSERTION N/A 08/10/2020   Procedure: LAPAROSCOPIC INSERTION CONTINUOUS AMBULATORY PERITONEAL DIALYSIS  (CAPD) CATHETER;  Surgeon: Jules Husbands, MD;  Location: [facility] ORS;  Service: General;  Laterality: N/A;   CAPD REMOVAL N/A 11/08/2021   Procedure: REMOVAL CONTINUOUS AMBULATORY PERITONEAL DIALYSIS  (CAPD) CATHETER;  Surgeon: Jules Husbands, MD;  Location: [facility] ORS;  Service: General;  Laterality: N/A;   CAPD REVISION N/A 08/16/2021   Procedure: LAPAROSCOPIC REVISION CONTINUOUS AMBULATORY PERITONEAL DIALYSIS  (CAPD) CATHETER;  Surgeon: Jules Husbands, MD;  Location: [facility] ORS;  Service: General;  Laterality: N/A;   COLONOSCOPY     2003, 2011   COLONOSCOPY WITH PROPOFOL N/A 05/25/2015   Procedure: COLONOSCOPY WITH PROPOFOL;  Surgeon: Manya Silvas, MD;  Location: [facility] ENDOSCOPY;  Service: Endoscopy;  Laterality: N/A;   COLONOSCOPY WITH PROPOFOL N/A 05/12/2020   Procedure: COLONOSCOPY WITH PROPOFOL;  Surgeon: Lesly Rubenstein, MD;  Location: [facility] ENDOSCOPY;  Service: Endoscopy;  Laterality: N/A;    ESOPHAGOGASTRODUODENOSCOPY     2003, 2011   ESOPHAGOGASTRODUODENOSCOPY (EGD) WITH PROPOFOL N/A 05/12/2020   Procedure: ESOPHAGOGASTRODUODENOSCOPY (EGD) WITH PROPOFOL;  Surgeon: Lesly Rubenstein, MD;  Location: [facility] ENDOSCOPY;  Service: Endoscopy;  Laterality: N/A;   FRACTURE SURGERY Left 1997   arm   XI ROBOTIC ASSISTED INGUINAL HERNIA REPAIR WITH MESH Right 08/23/2021   Procedure: XI ROBOTIC ASSISTED INGUINAL HERNIA REPAIR WITH MESH;  Surgeon: Jules Husbands, MD;  Location: [facility] ORS;  Service: General;  Laterality: Right;   Social History:  reports that he has never smoked. He has never been exposed to tobacco smoke. He has never used smokeless tobacco. He reports that he does not drink alcohol and does not use drugs.  Allergies  Allergen Reactions   Cefuroxime Rash    TOLERATED CEFAZOLIN   Tramadol Nausea Only and Other (See Comments)    Dizziness   History reviewed. No pertinent family history. Family history: Family history reviewed and not pertinent.  Prior to Admission medications   Medication Sig Start Date End Date Taking? Authorizing Provider  acetaminophen (TYLENOL) 325 MG tablet Take 650 mg by mouth every 6 (six) hours as needed for mild pain.    [provider]  amLODipine (NORVASC) 5 MG tablet Take 5 mg by mouth every morning.    [provider]  atorvastatin (LIPITOR) 10 MG tablet Take 10 mg by mouth every morning.    [provider]  calcitRIOL (ROCALTROL) 0.25 MCG capsule Take 0.25 mcg by mouth daily.    [provider]  Cholecalciferol 5000 units TABS Take 5,000 Units by mouth once a week.    [provider]  dorzolamide-timolol (COSOPT) 22.3-6.8 MG/ML ophthalmic solution Place 1 drop into both eyes 2 (two) times daily.    [provider]  esomeprazole (NEXIUM) 40 MG capsule Take 40 mg by mouth 2 (two) times daily. Patient not taking: Reported on 11/08/2021    [provider]  fluticasone (FLONASE) 50  MCG/ACT nasal spray Place 2 sprays into both nostrils daily as needed for allergies or rhinitis.    [provider]  furosemide (LASIX) 80 MG tablet Take 80 mg by mouth daily.    [provider]  HYDROcodone-acetaminophen (NORCO/VICODIN) 5-325 MG tablet Take 1-2 tablets by mouth every 6 (six) hours as needed for moderate pain. 11/08/21   Pabon, [place], MD  lactulose (CHRONULAC) 10 GM/15ML solution Take 20 g by mouth 2 (two) times daily. 08/15/21   [provider]  losartan (COZAAR) 50 MG tablet Take 50 mg by mouth at bedtime.    [provider]  metoprolol tartrate (LOPRESSOR) 25 MG tablet Take 25 mg by mouth every morning. 06/19/20   [provider]  Netarsudil-Latanoprost (ROCKLATAN) 0.02-0.005 % SOLN Place 1 drop into both eyes every evening.    [provider]  pantoprazole (PROTONIX) 20 MG tablet Take 20 mg by mouth every morning. 06/25/21   [provider]  potassium chloride SA (KLOR-CON) 20 MEQ tablet Take 1 tablet (20 mEq total) by mouth 2 (two) times daily.  08/04/20   Jules Husbands, MD   Physical Exam: Vitals:   12/11/21 1815 12/11/21 1830 12/11/21 1845 12/11/21 1900  BP: (!) 190/115 (!) 179/107 (!) 186/116 (!) 200/130  Pulse: (!) 109 (!) 108 (!) 114 (!) 113  Resp: (!) '21 20 20 20  '$ Temp:      TempSrc:      SpO2: 96% 95% 93% 97%  Weight:      Height:       Constitutional: appears age-appropriate, NAD, calm, comfortable Eyes: PERRL, lids and conjunctivae normal ENMT: Mucous membranes are moist. Posterior pharynx clear of any exudate or lesions. Age-appropriate dentition. Hearing appropriate Neck: normal, supple, no masses, no thyromegaly Respiratory: clear to auscultation bilaterally, no wheezing, no crackles. Normal respiratory effort. No accessory muscle use.  Cardiovascular: Regular rate and rhythm, no murmurs / rubs / gallops. No extremity edema. 2+ pedal pulses. No carotid bruits.  Abdomen: no tenderness, no masses  palpated, no hepatosplenomegaly. Bowel sounds positive.  Musculoskeletal: no clubbing / cyanosis. No joint deformity upper and lower extremities. Good ROM, no contractures, no atrophy. Normal muscle tone.  Skin: no rashes, lesions, ulcers. No induration Neurologic: Sensation intact. Strength 5/5 in all 4.  Psychiatric: Normal judgment and insight. Alert and oriented x 3. Normal mood.   EKG: independently reviewed, showing sinus tachycardia with rate of 105, QTc 455  Chest x-ray on Admission: I personally reviewed and I agree with radiologist reading as below.  CT Angio Chest PE W and/or Wo Contrast  Result Date: 12/11/2021 CLINICAL DATA:  Elevated D-dimer level.  Dialysis patient. EXAM: CT ANGIOGRAPHY CHEST WITH CONTRAST TECHNIQUE: Multidetector CT imaging of the chest was performed using the standard protocol during bolus administration of intravenous contrast. Multiplanar CT image reconstructions and MIPs were obtained to evaluate the vascular anatomy. RADIATION DOSE REDUCTION: This exam was performed according to the departmental dose-optimization program which includes automated exposure control, adjustment of the mA and/or kV according to patient size and/or use of iterative reconstruction technique. CONTRAST:  42m OMNIPAQUE IOHEXOL 350 MG/ML SOLN COMPARISON:  Chest radiograph 12/11/2021 FINDINGS: Cardiovascular: Acute pulmonary embolus in the anterior segmental branch of the left upper lobe pulmonary artery on image 121 series 5. Acute pulmonary embolus in the posterior basal segment right lower lobe pulmonary artery on image 239 series 5. No lobar or larger clot identified. Atherosclerotic calcifications in the descending thoracic aorta and left anterior descending coronary artery. Borderline cardiomegaly. The right IJ dialysis catheter tip is borderline low in position, at the junction of the right atrium and IVC. Mediastinum/Nodes: Unremarkable Lungs/Pleura: Small bilateral pleural effusions.  There is some mild scattered bilateral ground-glass opacities. Secondary pulmonary lobular interstitial accentuation is present in both lungs. Appearance suggests pulmonary edema. Upper Abdomen: Unremarkable Musculoskeletal: Thoracic spondylosis. Review of the MIP images confirms the above findings. IMPRESSION: 1. Two small segmental pulmonary emboli, one in the anterior segmental branch of the left upper lobe pulmonary artery, and the other in the posterior basal segment right lower lobe branch of the pulmonary artery. No lobar or greater pulmonary embolus identified. 2. Borderline cardiomegaly with secondary pulmonary lobular interstitial accentuation, small bilateral pleural effusions, and some patchy ground-glass opacities favoring acute pulmonary edema. 3. Aortic Atherosclerosis (ICD10-I70.0). Left anterior descending coronary artery atherosclerosis. 4. Borderline low position of the right IJ dialysis catheter, tip at the junction of the right atrium and the IVC. Critical Value/emergent results were called by telephone at the time of interpretation on 12/11/2021 at 11:54 am to provider Dr. PConni Slipper,  who verbally acknowledged these results. Electronically Signed   By: Van Clines M.D.   On: 12/11/2021 11:56   DG Chest 2 View  Result Date: 12/11/2021 CLINICAL DATA:  sob EXAM: CHEST - 2 VIEW COMPARISON:  None Available. FINDINGS: Diffuse interstitial opacities. Pulmonary vascular congestion. Mild enlargement the cardiac silhouette. Small pleural effusions. No visible pneumothorax. Right IJ large-bore central venous catheter with the tip projecting at the right atrium. IMPRESSION: Mild cardiomegaly, pulmonary vascular congestion, interstitial pulmonary edema, and small pleural effusions. Electronically Signed   By: Margaretha Sheffield M.D.   On: 12/11/2021 08:07    Labs on Admission: I have personally reviewed following labs  CBC: Recent Labs  Lab 12/06/21 1350 12/11/21 0726  WBC  --  9.8   HGB 12.8* 12.0*  HCT  --  38.4*  MCV  --  89.3  PLT  --  672   Basic Metabolic Panel: Recent Labs  Lab 12/06/21 1350 12/11/21 0726  NA  --  136  K 3.9 4.4  CL  --  104  CO2  --  22  GLUCOSE  --  111*  BUN  --  37*  CREATININE  --  9.34*  CALCIUM  --  8.1*   GFR: Estimated Creatinine Clearance: 7.5 mL/min (A) (by C-G formula based on SCr of 9.34 mg/dL (H)).  Coagulation Profile: Recent Labs  Lab 12/06/21 1350 12/11/21 0850  INR 1.1 1.1   CRITICAL CARE Performed by: Briant Cedar Toneka Fullen  Total critical care time: 35 minutes  Critical care time was exclusive of separately billable procedures and treating other patients.  Critical care was necessary to treat or prevent imminent or life-threatening deterioration.  Critical care was time spent personally by me on the following activities: development of treatment plan with patient and/or surrogate as well as nursing, discussions with consultants, evaluation of patient's response to treatment, examination of patient, obtaining history from patient or surrogate, ordering and performing treatments and interventions, ordering and review of laboratory studies, ordering and review of radiographic studies, pulse oximetry and re-evaluation of patient's condition.  Dr. Tobie Poet [facility]  If 7PM-7AM, please contact overnight-coverage provider If 7AM-7PM, please contact day coverage provider [email or web address]  12/11/2021, 7:48 PM

## 2021-12-11 NOTE — ED Provider Notes (Signed)
[facility] Provider Note    Event Date/Time   First MD Initiated Contact with Patient 12/11/21 0827     (approximate)   History   Shortness of Breath   HPI  BECKY BERBERIAN is a 66 y.o. male who comes in with his family.  They report he has been having a dry cough since she had his shunt redone.  this was about a week ago.  Cough is nonproductive.  He is a little short of breath.  Patient not running a fever.  He had hemodialysis yesterday.  He does not have any chest pain or tightness.      Physical Exam   Triage Vital Signs: ED Triage Vitals  Enc Vitals Group     BP 12/11/21 0720 (!) 179/105     Pulse Rate 12/11/21 0720 (!) 104     Resp 12/11/21 0720 20     Temp 12/11/21 0720 98.5 F (36.9 C)     Temp Source 12/11/21 0720 Oral     SpO2 12/11/21 0720 98 %     Weight 12/11/21 0723 174 lb 2.6 oz (79 kg)     Height 12/11/21 0723 '5\' 5"'$  (1.651 m)     Head Circumference --      Peak Flow --      Pain Score 12/11/21 0723 0     Pain Loc --      Pain Edu? --      Excl. in [place]? --     Most recent vital signs: Vitals:   12/11/21 1100 12/11/21 1123  BP: (!) 172/101   Pulse: (!) 101   Resp: (!) 22   Temp:  98.6 F (37 C)  SpO2: 100%      General: Awake, alert at baseline mental status he says he is little bit short of breath and is breathing little fast. CV:  Good peripheral perfusion.  Heart regular rate and rhythm no audible murmurs Resp:  Normal effort.  Lungs some crackles in the bases Abd:  No distention.  Soft and nontender Extremities: Minimal trace edema bilaterally   ED Results / Procedures / Treatments   Labs (all labs ordered are listed, but only abnormal results are displayed) Labs Reviewed  CBC - Abnormal; Notable for the following components:      Result Value   Hemoglobin 12.0 (*)    HCT 38.4 (*)    RDW 16.2 (*)    All other components within normal limits  BASIC METABOLIC PANEL - Abnormal; Notable for the following  components:   Glucose, Bld 111 (*)    BUN 37 (*)    Creatinine, Ser 9.34 (*)    Calcium 8.1 (*)    GFR, Estimated 6 (*)    All other components within normal limits  BRAIN NATRIURETIC PEPTIDE - Abnormal; Notable for the following components:   B Natriuretic Peptide 1,962.2 (*)    All other components within normal limits  D-DIMER, QUANTITATIVE - Abnormal; Notable for the following components:   D-Dimer, Quant 2.97 (*)    All other components within normal limits  TROPONIN I (HIGH SENSITIVITY) - Abnormal; Notable for the following components:   Troponin I (High Sensitivity) 64 (*)    All other components within normal limits  TROPONIN I (HIGH SENSITIVITY) - Abnormal; Notable for the following components:   Troponin I (High Sensitivity) 122 (*)    All other components within normal limits  PROTIME-INR  APTT  TROPONIN I (HIGH SENSITIVITY)  EKG EKG read and interpreted by me shows sinus tachycardia at a rate of 105 normal axis essentially normal EKG.    RADIOLOGY Chest x-ray read by radiology reviewed and interpreted by me shows CHF.   PROCEDURES:  Critical Care performed: Critical care time 45 minutes.  This includes evaluating the patient several times talking to his brother and I think it was his niece.  Also I spoke with the radiologist and the renal doctor and speaking with the hospitalist.  Patient has multiple pulmonary emboli and a rising troponin and congestive heart failure.  Have given him some Lasix for the CHF.  Procedures   MEDICATIONS ORDERED IN ED: Medications  furosemide (LASIX) injection 80 mg (80 mg Intravenous Given 12/11/21 0957)  aspirin chewable tablet 324 mg (324 mg Oral Given 12/11/21 1121)  iohexol (OMNIPAQUE) 350 MG/ML injection 75 mL (75 mLs Intravenous Contrast Given 12/11/21 1132)     IMPRESSION / MDM / ASSESSMENT AND PLAN / ED COURSE  I reviewed the triage vital signs and the nursing notes. ----------------------------------------- 9:56  AM on 12/11/2021 ----------------------------------------- Patient's D-dimer and BNP are elevated.  I discussed with Dr. Zollie Scale.  We will give him some dye and get a CT angio to make sure his not having a PE.  He is somewhat tachycardic and has a dry cough.  If possible we will try to do this dialysis tomorrow and schedule if not we can do it here today.  We will talk to the family about that as well.  Second troponin is being drawn now.    Patient's presentation is most consistent with acute presentation with potential threat to life or bodily function. The patient is on the cardiac monitor to evaluate for evidence of arrhythmia and/or significant heart rate changes.  Nothing was seen but sinus tachycardia    FINAL CLINICAL IMPRESSION(S) / ED DIAGNOSES   Final diagnoses:  Multiple subsegmental pulmonary emboli without acute cor pulmonale (HCC)  Congestive heart failure, unspecified HF chronicity, unspecified heart failure type ([place])  Elevated troponin     Rx / DC Orders   ED Discharge Orders     None        Note:  This document was prepared using Dragon voice recognition software and may include unintentional dictation errors.   Nena Polio, MD 12/11/21 1214

## 2021-12-11 NOTE — ED Triage Notes (Signed)
Pt from home via [facility] with reports that he began this am having sob with exertion. Pt denies pain. Reports MWF HD, had treatment yesterday. Pt was 92% RA, placed on 2LNC- 98%.

## 2021-12-11 NOTE — Assessment & Plan Note (Addendum)
Secondary to volume overload from needing dialysis.  Continued on his Norvasc, ARB and beta-blocker.  As needed hydralazine.  Blood pressures better today ranging from the 140s to 150s.

## 2021-12-11 NOTE — ED Notes (Signed)
Pt to ED for SOB with exertion that started this morning. Pt was placed on 2L oxygen in triage. 100% on 2L currently. ST on monitor. Denies chest pain.

## 2021-12-11 NOTE — Assessment & Plan Note (Addendum)
Status post - 12/06/21: Insertion of arteriovenous Gore-Tex graft into the left brachial axillary for hemodialysis access Seen by nephrology with dialysis as per his regular schedule.  Dialysis today.

## 2021-12-11 NOTE — Assessment & Plan Note (Signed)
Stable.  Hemoglobin at 12.2 on 7/19.

## 2021-12-11 NOTE — Assessment & Plan Note (Signed)
-   Patient takes atorvastatin 10 mg daily

## 2021-12-11 NOTE — Hospital Course (Addendum)
66 year old male with history of hyperlipidemia, hypertension, GERD, end-stage renal disease on hemodialysis, who presented to the emergency department on 7/18 for dyspnea on exertion.  Work-up revealed 2 small pulmonary emboli, as well as malignant hypertension with systolic blood pressures ranging from the 160s to 200.  BNP elevated at 1900.  COVID test positive.  Initial troponin at 64, however repeat troponins further elevated in the 100s and 200s.  Admitted to the hospitalist service and nephrology consulted for dialysis.  Critical care consulted on 7/19 for central line placement for lab draw.  Patient underwent dialysis on 7/19 and on 7/22.  Blood pressures have since improved.

## 2021-12-11 NOTE — Progress Notes (Signed)
[place] for initiation and monitoring of heparin infusion Indication: pulmonary embolus  Allergies  Allergen Reactions   Cefuroxime Rash    TOLERATED CEFAZOLIN   Tramadol Nausea Only and Other (See Comments)    Dizziness    Patient Measurements: Height: '5\' 5"'$  (165.1 cm) Weight: 79 kg (174 lb 2.6 oz) IBW/kg (Calculated) : 61.5 Heparin Dosing Weight: 77.5 kg  Vital Signs: Temp: 98.6 F (37 C) (07/18 1123) Temp Source: Oral (07/18 1123) BP: 172/101 (07/18 1100) Pulse Rate: 101 (07/18 1100)  Labs: Recent Labs    12/11/21 0726 12/11/21 0850  HGB 12.0*  --   HCT 38.4*  --   PLT 215  --   APTT  --  37*  LABPROT  --  14.3  INR  --  1.1  CREATININE 9.34*  --   TROPONINIHS 64* 122*    Estimated Creatinine Clearance: 7.3 mL/min (A) (by C-G formula based on SCr of 9.34 mg/dL (H)).   Medical History: Past Medical History:  Diagnosis Date   Anemia    Arthritis    Cerebral palsy (HCC)    DDD (degenerative disc disease), lumbar    Deficiency of vitamin B12    Encephalitis    ESRD (end stage renal disease) on dialysis (HCC)    M-W-F   GERD (gastroesophageal reflux disease)    Hemorrhoid    Hypercholesterolemia    Hyperkalemia    Hypertension    PONV (postoperative nausea and vomiting)    n/v after 08-16-21 capd revision   Tremor    Tubular adenoma of colon    Vitamin D deficiency      Assessment: 66 year old male w/ PMH of HLD, HTN, GERD, ESRD on HD who presents emergency department for chief concerns of dyspnea with exertion. CT angio revealed two small segmental pulmonary emboli. A review of the chart reveals no chronic anticoagulation prior to admission   Goal of Therapy:  Heparin level 0.3-0.7 units/ml Monitor platelets by anticoagulation protocol: Yes   Plan:  Give 5000 units bolus x 1 Start heparin infusion at 1300 units/hr Check anti-Xa level in 8 hours and daily while on heparin Continue to monitor H&H and  platelets  Dallie Piles 12/11/2021,12:35 PM

## 2021-12-11 NOTE — Assessment & Plan Note (Signed)
Likely some demand ischemia secondary to his volume overload from dialysis as well as PE.  Complete echocardiogram pending.  On heparin drip.

## 2021-12-11 NOTE — ED Notes (Signed)
Pt is still in room 18 and was moved back on the board. Dr Cinda Quest informed of second troponin result of 122 up from 64. IV team consult was placed earlier for 20g IV access for ordered CTA.

## 2021-12-11 NOTE — Assessment & Plan Note (Addendum)
Likely secondary to COVID-19 infection.  No evidence of heart strain.  Complete echocardiogram ordered.  Currently on heparin, changed over to Eliquis.

## 2021-12-11 NOTE — Assessment & Plan Note (Signed)
PPI ?

## 2021-12-11 NOTE — ED Notes (Signed)
Troponin 1.22

## 2021-12-11 NOTE — Progress Notes (Signed)
[place] for initiation and monitoring of heparin infusion Indication: pulmonary embolus  Allergies  Allergen Reactions   Cefuroxime Rash    TOLERATED CEFAZOLIN   Tramadol Nausea Only and Other (See Comments)    Dizziness    Patient Measurements: Height: '5\' 6"'$  (167.6 cm) Weight: 78.2 kg (172 lb 6.4 oz) IBW/kg (Calculated) : 63.8 Heparin Dosing Weight: 77.5 kg  Vital Signs: Temp: 98.2 F (36.8 C) (07/18 2000) Temp Source: Oral (07/18 1732) BP: 181/102 (07/18 2000) Pulse Rate: 103 (07/18 2000)  Labs: Recent Labs    12/11/21 0726 12/11/21 0850 12/11/21 1325 12/11/21 1500 12/11/21 1852  HGB 12.0*  --   --   --   --   HCT 38.4*  --   --   --   --   PLT 215  --   --   --   --   APTT  --  37*  --   --   --   LABPROT  --  14.3  --   --   --   INR  --  1.1  --   --   --   HEPARINUNFRC  --   --   --   --  0.82*  CREATININE 9.34*  --   --   --   --   TROPONINIHS 64* 122* 223* 249* 287*     Estimated Creatinine Clearance: 7.5 mL/min (A) (by C-G formula based on SCr of 9.34 mg/dL (H)).   Medical History: Past Medical History:  Diagnosis Date   Anemia    Arthritis    Cerebral palsy (HCC)    DDD (degenerative disc disease), lumbar    Deficiency of vitamin B12    Encephalitis    ESRD (end stage renal disease) on dialysis (HCC)    M-W-F   GERD (gastroesophageal reflux disease)    Hemorrhoid    Hypercholesterolemia    Hyperkalemia    Hypertension    PONV (postoperative nausea and vomiting)    n/v after 08-16-21 capd revision   Tremor    Tubular adenoma of colon    Vitamin D deficiency      Assessment: 66 year old male w/ PMH of HLD, HTN, GERD, ESRD on HD who presents emergency department for chief concerns of dyspnea with exertion. CT angio revealed two small segmental pulmonary emboli. A review of the chart reveals no chronic anticoagulation prior to admission   Goal of Therapy:  Heparin level 0.3-0.7 units/ml Monitor  platelets by anticoagulation protocol: Yes   Plan:  Decrease heparin infusion to 1150 units/hr Check anti-Xa level in 8 hours and daily while on heparin Continue to monitor H&H and platelets  Darrick Penna 12/11/2021,9:16 PM

## 2021-12-11 NOTE — Assessment & Plan Note (Addendum)
Started on molnupiravir twice daily x5 days which he completed by day of discharge.  Airborne precautions.  Likely causing blood clot, but patient himself is not hypoxic.

## 2021-12-12 ENCOUNTER — Inpatient Hospital Stay
Admit: 2021-12-12 | Discharge: 2021-12-12 | Disposition: A | Discharge status: Home / Self Care | Payer: Medicare HMO | Attending: Internal Medicine | Admitting: Internal Medicine

## 2021-12-12 ENCOUNTER — Inpatient Hospital Stay: Payer: Medicare HMO

## 2021-12-12 DIAGNOSIS — I10 Essential (primary) hypertension: Secondary | ICD-10-CM | POA: Diagnosis not present

## 2021-12-12 DIAGNOSIS — R778 Other specified abnormalities of plasma proteins: Secondary | ICD-10-CM

## 2021-12-12 DIAGNOSIS — I2694 Multiple subsegmental pulmonary emboli without acute cor pulmonale: Secondary | ICD-10-CM

## 2021-12-12 DIAGNOSIS — E663 Overweight: Secondary | ICD-10-CM

## 2021-12-12 DIAGNOSIS — I2699 Other pulmonary embolism without acute cor pulmonale: Secondary | ICD-10-CM | POA: Diagnosis not present

## 2021-12-12 LAB — HEPATITIS B CORE ANTIBODY, TOTAL: Hep B Core Total Ab: NONREACTIVE

## 2021-12-12 LAB — BASIC METABOLIC PANEL
Anion gap: 11 (ref 5–15)
BUN: 54 mg/dL — ABNORMAL HIGH (ref 8–23)
CO2: 22 mmol/L (ref 22–32)
Calcium: 8.2 mg/dL — ABNORMAL LOW (ref 8.9–10.3)
Chloride: 103 mmol/L (ref 98–111)
Creatinine, Ser: 11.77 mg/dL — ABNORMAL HIGH (ref 0.61–1.24)
GFR, Estimated: 4 mL/min — ABNORMAL LOW (ref >60)
Glucose, Bld: 91 mg/dL (ref 70–99)
Potassium: 4.7 mmol/L (ref 3.5–5.1)
Sodium: 136 mmol/L (ref 135–145)

## 2021-12-12 LAB — CBC
HCT: 39.2 % (ref 39.0–52.0)
Hemoglobin: 12.2 g/dL — ABNORMAL LOW (ref 13.0–17.0)
MCH: 27.7 pg (ref 26.0–34.0)
MCHC: 31.1 g/dL (ref 30.0–36.0)
MCV: 88.9 fL (ref 80.0–100.0)
Platelets: 217 10*3/uL (ref 150–400)
RBC: 4.41 MIL/uL (ref 4.22–5.81)
RDW: 16.6 % — ABNORMAL HIGH (ref 11.5–15.5)
WBC: 8.5 10*3/uL (ref 4.0–10.5)
nRBC: 0 % (ref 0.0–0.2)

## 2021-12-12 LAB — ECHOCARDIOGRAM COMPLETE
AR max vel: 2.9 cm2
AV Area VTI: 3.71 cm2
AV Area mean vel: 2.86 cm2
AV Mean grad: 2 mmHg
AV Peak grad: 4.2 mmHg
Ao pk vel: 1.03 m/s
Area-P 1/2: 6.37 cm2
Height: 66 in
S' Lateral: 3.4 cm
Weight: 2758.4 oz

## 2021-12-12 LAB — HEPARIN LEVEL (UNFRACTIONATED)
Heparin Unfractionated: 0.12 IU/mL — ABNORMAL LOW (ref 0.30–0.70)
Heparin Unfractionated: 0.58 IU/mL (ref 0.30–0.70)

## 2021-12-12 LAB — HEPATITIS C ANTIBODY: HCV Ab: NONREACTIVE

## 2021-12-12 LAB — HEPATITIS B SURFACE ANTIGEN: Hepatitis B Surface Ag: NONREACTIVE

## 2021-12-12 LAB — HEPATITIS B SURFACE ANTIBODY,QUALITATIVE: Hep B S Ab: NONREACTIVE

## 2021-12-12 MED ORDER — ALTEPLASE 2 MG IJ SOLR: 2.0000 mg | Freq: Once | INTRAMUSCULAR | Status: DC | PRN

## 2021-12-12 MED ORDER — HEPARIN SODIUM (PORCINE) 1000 UNIT/ML DIALYSIS
1000.0000 [IU] | INTRAMUSCULAR | Status: DC | PRN
Start: 2021-12-12 — End: 2021-12-16

## 2021-12-12 MED ORDER — ANTICOAGULANT SODIUM CITRATE 4% (200MG/5ML) IV SOLN: 5.0000 mL | Status: DC | PRN

## 2021-12-12 MED ORDER — FENTANYL CITRATE PF 50 MCG/ML IJ SOSY
25.0000 ug | PREFILLED_SYRINGE | INTRAMUSCULAR | Status: AC
  Administered 2021-12-12: 25 ug via INTRAVENOUS
  Filled 2021-12-12: qty 1

## 2021-12-12 MED ORDER — HEPARIN (PORCINE) 25000 UT/250ML-% IV SOLN
1200.0000 [IU]/h | INTRAVENOUS | Status: DC
Start: 2021-12-12 — End: 2021-12-13
  Administered 2021-12-12 – 2021-12-13 (×2): 1150 [IU]/h via INTRAVENOUS
  Filled 2021-12-12: qty 250

## 2021-12-12 MED ORDER — LIDOCAINE-PRILOCAINE 2.5-2.5 % EX CREA: 1.0000 | TOPICAL_CREAM | CUTANEOUS | Status: DC | PRN

## 2021-12-12 MED ORDER — PENTAFLUOROPROP-TETRAFLUOROETH EX AERO
1.0000 | INHALATION_SPRAY | CUTANEOUS | Status: DC | PRN
Start: 2021-12-12 — End: 2021-12-16

## 2021-12-12 MED ORDER — GUAIFENESIN ER 600 MG PO TB12: 600.0000 mg | ORAL_TABLET | Freq: Two times a day (BID) | ORAL | Status: AC | PRN

## 2021-12-12 MED ORDER — LIDOCAINE HCL (PF) 1 % IJ SOLN: 5.0000 mL | INTRAMUSCULAR | Status: DC | PRN

## 2021-12-12 MED ORDER — GUAIFENESIN-DM 100-10 MG/5ML PO SYRP
5.0000 mL | ORAL_SOLUTION | ORAL | Status: DC | PRN
  Administered 2021-12-12 – 2021-12-14 (×4): 5 mL via ORAL
  Filled 2021-12-12 (×2): qty 10
  Filled 2021-12-12: qty 5
  Filled 2021-12-12: qty 10

## 2021-12-12 NOTE — Progress Notes (Signed)
*  PRELIMINARY RESULTS* Echocardiogram 2D Echocardiogram has been performed.  [name] 12/12/2021, 10:22 AM

## 2021-12-12 NOTE — Plan of Care (Signed)

## 2021-12-12 NOTE — Progress Notes (Addendum)
[place] for initiation and monitoring of heparin infusion Indication: pulmonary embolus  Allergies  Allergen Reactions   Cefuroxime Rash    TOLERATED CEFAZOLIN   Tramadol Nausea Only and Other (See Comments)    Dizziness    Patient Measurements: Height: '5\' 6"'$  (167.6 cm) Weight: 78.2 kg (172 lb 6.4 oz) IBW/kg (Calculated) : 63.8 Heparin Dosing Weight: 77.5 kg  Vital Signs: Temp: 98.3 F (36.8 C) (07/19 1927) Temp Source: Axillary (07/19 1730) BP: 152/91 (07/19 1927) Pulse Rate: 95 (07/19 1927)  Labs: Recent Labs    12/11/21 0726 12/11/21 0850 12/11/21 1325 12/11/21 1500 12/11/21 1852 12/11/21 2129 12/12/21 1133 12/12/21 2016  HGB 12.0*  --   --   --   --   --  12.2*  --   HCT 38.4*  --   --   --   --   --  39.2  --   PLT 215  --   --   --   --   --  217  --   APTT  --  37*  --   --   --   --   --   --   LABPROT  --  14.3  --   --   --   --   --   --   INR  --  1.1  --   --   --   --   --   --   HEPARINUNFRC  --   --   --   --  0.82*  --  0.12* 0.58  CREATININE 9.34*  --   --   --   --   --  11.77*  --   TROPONINIHS 64* 122*   < > 249* 287* 284*  --   --    < > = values in this interval not displayed.     Estimated Creatinine Clearance: 5.9 mL/min (A) (by C-G formula based on SCr of 11.77 mg/dL (H)).   Medical History: Past Medical History:  Diagnosis Date   Anemia    Arthritis    Cerebral palsy (HCC)    DDD (degenerative disc disease), lumbar    Deficiency of vitamin B12    Encephalitis    ESRD (end stage renal disease) on dialysis (HCC)    M-W-F   GERD (gastroesophageal reflux disease)    Hemorrhoid    Hypercholesterolemia    Hyperkalemia    Hypertension    PONV (postoperative nausea and vomiting)    n/v after 08-16-21 capd revision   Tremor    Tubular adenoma of colon    Vitamin D deficiency   Heparin Dosing Weight: 77.5 kg   Assessment: 66 year old male w/ PMH of HLD, HTN, GERD, ESRD on HD who  presents emergency department for chief concerns of dyspnea with exertion. CT angio revealed two small segmental pulmonary emboli. A review of the chart reveals no chronic anticoagulation prior to admission   Date Time aPTT/HL Rate/Comment 0718 1852  0.82  1300>1150 un/hr 0719 0900 --  Unable to get blood draw. Heparin stopped for CVC placement 0719 1230 --  Heparin resumed at prior rate, no initial bolus d/t new line.  0720 2016 0.58  Continue 1150 un/hr/ Therapeutic x1  Baseline Labs: aPTT - 37s INR - 1.1 Hgb - 12>12.2 (stable/LLN) Plts - 215 (stable/WNL)   Goal of Therapy:  Heparin level 0.3-0.7 units/ml Monitor platelets by anticoagulation protocol: Yes   Plan:  HL therapeutic x1  Continue heparin infusion at 1150 units/hr.  Check anti-Xa level in 8 hours and daily while on heparin Continue to monitor H&H and platelets  Darrick Penna 12/12/2021,9:38 PM

## 2021-12-12 NOTE — Procedures (Signed)
Central Venous Catheter Insertion Procedure Note [facility] [id number] [birth year]  Procedure: Insertion of Central Venous Catheter Indications: Assessment of intravascular volume, Drug and/or fluid administration, and Frequent blood sampling  Procedure Details Consent: Risks of procedure as well as the alternatives and risks of each were explained to the (patient/caregiver).  Consent for procedure obtained. Time Out: Verified patient identification, verified procedure, site/side was marked, verified correct patient position, special equipment/implants available, medications/allergies/relevent history reviewed, required imaging and test results available.  Performed  Maximum sterile technique was used including antiseptics, cap, gloves, gown, hand hygiene, mask, and sheet. Skin prep: Chlorhexidine; local anesthetic administered A antimicrobial bonded/coated triple lumen catheter was placed in the left internal jugular vein using the Seldinger technique.  Evaluation Blood flow good Complications: No apparent complications Patient did tolerate procedure well. Chest X-ray ordered to verify placement.  CXR: pending.   Line secured at the 20 cm mark. BIOPATCH applied to the insertion site.   Darel Hong, AGACNP-BC [facility] Prefer epic messenger for cross cover needs If after hours, please call E-link  Bradly Bienenstock 12/12/2021, 11:37 AM  .

## 2021-12-12 NOTE — Progress Notes (Signed)
[facility] Progress Note  Patient: James Black    TTS:[id number]  DOA: 12/11/2021    Date of Service: the patient was seen and examined on 12/12/2021  Brief hospital course: 66 year old male with history of hyperlipidemia, hypertension, GERD, end-stage renal disease on hemodialysis, who presented to the emergency department on 7/18 for dyspnea on exertion.  Work-up revealed 2 small pulmonary emboli, as well as malignant hypertension with systolic blood pressures ranging from the 160s to 200.  BNP elevated at 1900.  COVID test positive.  Initial troponin at 64, however repeat troponins further elevated in the 100s and 200s.  Admitted to the hospitalist service and nephrology consulted for dialysis.  Critical care consulted on 7/19 for central line placement for lab draw.  Patient underwent dialysis on 7/19.  Assessment and Plan: Assessment and Plan: * Bilateral pulmonary embolism ([place]) Likely secondary to COVID-19 infection.  No evidence of heart strain.  Complete echocardiogram ordered.  Patient on 2 L nasal cannula, but no evidence of hypoxia and more for COVID infection.  Currently on heparin and will look to changing over to oral anticoagulation  Malignant hypertension Secondary to volume overload from needing dialysis.  Continued on his Norvasc, ARB and beta-blocker.  As needed hydralazine.  COVID-19 virus infection Started on molnupiravir twice daily x5 days.  Airborne precautions.  Likely causing blood clot, but patient himself is not hypoxic  End stage renal disease ([place]) Status post - 12/06/21: Insertion of arteriovenous Gore-Tex graft into the left brachial axillary for hemodialysis access Seen by nephrology with dialysis as per his regular schedule today, next dialysis for Friday  Elevated troponin Likely some demand ischemia secondary to his volume overload from dialysis as well as PE.  Complete echocardiogram pending.  On heparin drip.  Anemia due to chronic kidney  disease, on chronic dialysis ([place]) Stable.  Hemoglobin at 12.2 on 7/19.  Hypercholesterolemia - Patient takes atorvastatin 10 mg daily  Gastro-esophageal reflux disease without esophagitis - PPI  Overweight (BMI 25.0-29.9) Meets criteria BMI greater than 25       Body mass index is 27.83 kg/m.        Consultants: Nephrology Critical care  Procedures: Central line placement 7/19 for IV access for labs Hemodialysis 7/19 Echocardiogram 7/19 noting grade 1 diastolic dysfunction and preserved ejection fraction  Antimicrobials: Molnupiravir day 2/5  Code Status: Full code   Subjective: Fatigued  Objective: Noted elevated blood pressures Vitals:   12/12/21 1555 12/12/21 1610  BP: (!) 166/135 (!) 186/108  Pulse:    Resp:    Temp:    SpO2:      Intake/Output Summary (Last 24 hours) at 12/12/2021 1612 Last data filed at 12/12/2021 1600 Gross per 24 hour  Intake 334.53 ml  Output 301 ml  Net 33.53 ml   Filed Weights   12/11/21 1800 12/12/21 0402 12/12/21 1325  Weight: 78.2 kg 78.2 kg 78.2 kg   Body mass index is 27.83 kg/m.  Exam:  General: Alert and oriented x3, no acute distress HEENT: Normocephalic and atraumatic, mucous membranes are moist Cardiovascular: Regular rate and rhythm, S1-S2 Respiratory: Clear to auscultation bilaterally Abdomen: Soft, nontender, nondistended, positive bowel sounds Musculoskeletal: No clubbing or cyanosis, trace pitting edema Skin: No skin breaks, tears or lesions Psychiatry: Appropriate, no evidence of psychoses Neurology: No focal deficits  Data Reviewed: Stable troponins.  Hemoglobin 12.2.  Disposition:  Status is: Inpatient Remains inpatient appropriate because: Stabilization of blood pressure    Anticipated discharge date: 7/20 or 7/21  Family Communication: Left message for family DVT Prophylaxis: Place TED hose Start: 12/11/21 1229    Author: Annita Brod ,MD 12/12/2021 4:12 PM  To reach  On-call, see care teams to locate the attending and reach out via [email or web address]. Between 7PM-7AM, please contact night-coverage If you still have difficulty reaching the attending provider, please page the [facility] (Director on Call) for [facility] on amion for assistance.

## 2021-12-12 NOTE — Progress Notes (Signed)
HD start at 1427. Lines reversed and BFR decreased to 300 d/t VP alarms. Gwyneth Revels, NP notified. Pt alert, no c/o, sbp>180, other vss. Pt has dry, non productive cough causing vp alarms on machine as well. Hep B labs pending, status unknown. Will continue to monitor, patient safety maintained.

## 2021-12-12 NOTE — Progress Notes (Signed)
[facility]  ROUNDING NOTE   Subjective:   James Black is a 65 year old male with past medical history including hypertension, GERD, hyperlipidemia, and end-stage renal disease on hemodialysis.  Patient presents to the emergency department with shortness of breath with activity.  Patient has been admitted for Elevated troponin [R77.8] Bilateral pulmonary embolism (HCC) [I26.99] Congestive heart failure, unspecified HF chronicity, unspecified heart failure type ([place]) [I50.9] Multiple subsegmental pulmonary emboli without acute cor pulmonale ([place]) [I26.94]  Patient is known to our practice and receives outpatient dialysis treatments at [facility] on a MWF schedule, supervised by Dr. Candiss Norse.  Patient received last treatment on Monday at outpatient clinic.  Patient seen and evaluated at bedside in ICU.  No family present at this time.  Patient states progressive shortness of breath over the past few days.  Patient states no oxygen requirement at home at baseline.  Oxygen saturation 92% on ED arrival, patient placed on 2 L nasal cannula.  Also states poor appetite, denies nausea or vomiting.  Denies diarrhea.  Denies chest pain or abdominal pain.  States he has a cough that is nonproductive.  No known fever or chills.  Labs on arrival unremarkable for renal patient.  Chest x-ray shows pulmonary vascular congestion, interstitial pulmonary edema, and small pleural effusion.  Respiratory panel positive for COVID-19.  We have been consulted to manage dialysis needs during this admission.   Objective:  Vital signs in last 24 hours:  Temp:  [97.7 F (36.5 C)-98.6 F (37 C)] 98.1 F (36.7 C) (07/19 1300) Pulse Rate:  [89-122] 95 (07/19 1630) Resp:  [13-23] 15 (07/19 1630) BP: (142-200)/(93-135) 188/111 (07/19 1640) SpO2:  [84 %-100 %] 97 % (07/19 1630) Weight:  [78.2 kg] 78.2 kg (07/19 1325)  Weight change:  Filed Weights   12/11/21 1800 12/12/21 0402 12/12/21 1325  Weight:  78.2 kg 78.2 kg 78.2 kg    Intake/Output: I/O last 3 completed shifts: In: 264.2 [I.V.:264.2] Out: 101 [Urine:100; Emesis/NG output:1]   Intake/Output this shift:  Total I/O In: 70.3 [I.V.:70.3] Out: 200 [Urine:200]  Physical Exam: General: Ill-appearing  Head: Normocephalic, atraumatic. Moist oral mucosal membranes  Eyes: Anicteric  Neck: Supple  Lungs:  Diminished in bases, normal effort, [place] O2  Heart: Regular rate and rhythm  Abdomen:  Soft, nontender, obese  Extremities: 1+ peripheral edema.  Neurologic: Nonfocal, moving all four extremities  Skin: No lesions  Access: Left aVF    Basic Metabolic Panel: Recent Labs  Lab 12/06/21 1350 12/11/21 0726 12/12/21 1133  NA  --  136 136  K 3.9 4.4 4.7  CL  --  104 103  CO2  --  22 22  GLUCOSE  --  111* 91  BUN  --  37* 54*  CREATININE  --  9.34* 11.77*  CALCIUM  --  8.1* 8.2*    Liver Function Tests: No results for input(s): "AST", "ALT", "ALKPHOS", "BILITOT", "PROT", "ALBUMIN" in the last 168 hours. No results for input(s): "LIPASE", "AMYLASE" in the last 168 hours. No results for input(s): "AMMONIA" in the last 168 hours.  CBC: Recent Labs  Lab 12/06/21 1350 12/11/21 0726 12/12/21 1133  WBC  --  9.8 8.5  HGB 12.8* 12.0* 12.2*  HCT  --  38.4* 39.2  MCV  --  89.3 88.9  PLT  --  215 217    Cardiac Enzymes: No results for input(s): "CKTOTAL", "CKMB", "CKMBINDEX", "TROPONINI" in the last 168 hours.  BNP: Invalid input(s): "POCBNP"  CBG: Recent Labs  Lab 12/11/21 1724  GLUCAP 134*    Microbiology: Results for orders placed or performed during the hospital encounter of 12/11/21  MRSA Next Gen by PCR, Nasal     Status: None   Collection Time: 12/11/21  5:44 PM   Specimen: Nasal Mucosa; Nasal Swab  Result Value Ref Range Status   MRSA by PCR Next Gen NOT DETECTED NOT DETECTED Final    Comment: (NOTE) The GeneXpert MRSA Assay (FDA approved for NASAL specimens only), is one component of a  comprehensive MRSA colonization surveillance program. It is not intended to diagnose MRSA infection nor to guide or monitor treatment for MRSA infections. Test performance is not FDA approved in patients less than 53 years old. Performed at [facility], [address] [place], [place] [address]   SARS Coronavirus 2 by RT PCR (hospital order, performed in [facility] hospital lab) *cepheid single result test* Nasal Mucosa     Status: Abnormal   Collection Time: 12/11/21  5:44 PM   Specimen: Nasal Mucosa; Nasal Swab  Result Value Ref Range Status   SARS Coronavirus 2 by RT PCR POSITIVE (A) NEGATIVE Final    Comment: (NOTE) SARS-CoV-2 target nucleic acids are DETECTED  SARS-CoV-2 RNA is generally detectable in upper respiratory specimens  during the acute phase of infection.  Positive results are indicative  of the presence of the identified virus, but do not rule out bacterial infection or co-infection with other pathogens not detected by the test.  Clinical correlation with patient history and  other diagnostic information is necessary to determine patient infection status.  The expected result is negative.  Fact Sheet for Patients:   https://www.patel.info/   Fact Sheet for Healthcare Providers:   https://hall.com/    This test is not yet approved or cleared by the [place] FDA and  has been authorized for detection and/or diagnosis of SARS-CoV-2 by FDA under an Emergency Use Authorization (EUA).  This EUA will remain in effect (meaning this test can be used) for the duration of  the COVID-19 declaration under Section 564(b)(1)  of the Act, 21 U.S.C. section 360-bbb-3(b)(1), unless the authorization is terminated or revoked sooner.   Performed at [facility], [address] [place], [place] [address]     Coagulation Studies: Recent Labs    12/11/21 0850  LABPROT 14.3  INR 1.1    Urinalysis: No  results for input(s): "COLORURINE", "LABSPEC", "PHURINE", "GLUCOSEU", "HGBUR", "BILIRUBINUR", "KETONESUR", "PROTEINUR", "UROBILINOGEN", "NITRITE", "LEUKOCYTESUR" in the last 72 hours.  Invalid input(s): "APPERANCEUR"    Imaging: ECHOCARDIOGRAM COMPLETE  Result Date: 12/12/2021    ECHOCARDIOGRAM REPORT   Patient Name:   James Black Date of Exam: 12/12/2021 Medical Rec #:  [id number]      Height:       66.0 in Accession #:    [id number]     Weight:       172.4 lb Date of Birth:  08-Sep-1955       BSA:          1.877 m Patient Age:    72 years       BP:           172/108 mmHg Patient Gender: M              HR:           96 bpm. Exam Location:  [facility] Procedure: 2D Echo, Cardiac Doppler and Color Doppler Indications:     Pulmonary Embolus I26.09  History:  Patient has no prior history of Echocardiogram examinations.                  Risk Factors:Hypertension.  Sonographer:     [name] Referring Phys:  [id number] AMY N COX Diagnosing Phys: Yolonda Kida MD  Sonographer Comments: Suboptimal apical window. IMPRESSIONS  1. Left ventricular ejection fraction, by estimation, is 50 to 55%. The left ventricle has low normal function. The left ventricle has no regional wall motion abnormalities. There is mild left ventricular hypertrophy. Left ventricular diastolic parameters are consistent with Grade I diastolic dysfunction (impaired relaxation).  2. Right ventricular systolic function is normal. The right ventricular size is normal.  3. The mitral valve is normal in structure. Trivial mitral valve regurgitation.  4. The aortic valve is normal in structure. Aortic valve regurgitation is not visualized. FINDINGS  Left Ventricle: Left ventricular ejection fraction, by estimation, is 50 to 55%. The left ventricle has low normal function. The left ventricle has no regional wall motion abnormalities. The left ventricular internal cavity size was normal in size. There is mild left ventricular hypertrophy. Left  ventricular diastolic parameters are consistent with Grade I diastolic dysfunction (impaired relaxation). Right Ventricle: The right ventricular size is normal. No increase in right ventricular wall thickness. Right ventricular systolic function is normal. Left Atrium: Left atrial size was normal in size. Right Atrium: Right atrial size was normal in size. Pericardium: There is no evidence of pericardial effusion. Mitral Valve: The mitral valve is normal in structure. Trivial mitral valve regurgitation. Tricuspid Valve: The tricuspid valve is normal in structure. Tricuspid valve regurgitation is mild. Aortic Valve: The aortic valve is normal in structure. Aortic valve regurgitation is not visualized. Aortic valve mean gradient measures 2.0 mmHg. Aortic valve peak gradient measures 4.2 mmHg. Aortic valve area, by VTI measures 3.71 cm. Pulmonic Valve: The pulmonic valve was normal in structure. Pulmonic valve regurgitation is not visualized. Aorta: The ascending aorta was not well visualized. IAS/Shunts: No atrial level shunt detected by color flow Doppler.  LEFT VENTRICLE PLAX 2D LVIDd:         4.70 cm   Diastology LVIDs:         3.40 cm   LV e' medial:    4.79 cm/s LV PW:         1.20 cm   LV E/e' medial:  12.3 LV IVS:        1.20 cm   LV e' lateral:   7.62 cm/s LVOT diam:     2.20 cm   LV E/e' lateral: 7.8 LV SV:         60 LV SV Index:   32 LVOT Area:     3.80 cm  RIGHT VENTRICLE RV Basal diam:  3.80 cm RV S prime:     11.70 cm/s TAPSE (M-mode): 2.1 cm LEFT ATRIUM             Index        RIGHT ATRIUM           Index LA diam:        3.80 cm 2.02 cm/m   RA Area:     18.80 cm LA Vol (A2C):   58.9 ml 31.37 ml/m  RA Volume:   55.90 ml  29.77 ml/m LA Vol (A4C):   53.3 ml 28.39 ml/m LA Biplane Vol: 57.4 ml 30.57 ml/m  AORTIC VALVE AV Area (Vmax):    2.90 cm AV Area (Vmean):   2.86 cm AV  Area (VTI):     3.71 cm AV Vmax:           102.50 cm/s AV Vmean:          66.800 cm/s AV VTI:            0.161 m AV Peak  Grad:      4.2 mmHg AV Mean Grad:      2.0 mmHg LVOT Vmax:         78.30 cm/s LVOT Vmean:        50.200 cm/s LVOT VTI:          0.157 m LVOT/AV VTI ratio: 0.98  AORTA Ao Root diam: 3.70 cm MITRAL VALVE               TRICUSPID VALVE MV Area (PHT): 6.37 cm    TR Peak grad:   18.8 mmHg MV Decel Time: 119 msec    TR Vmax:        217.00 cm/s MV E velocity: 59.10 cm/s MV A velocity: 69.00 cm/s  SHUNTS MV E/A ratio:  0.86        Systemic VTI:  0.16 m                            Systemic Diam: 2.20 cm Yolonda Kida MD Electronically signed by Yolonda Kida MD Signature Date/Time: 12/12/2021/12:52:15 PM    Final    DG Chest Port 1 View  Result Date: 12/12/2021 CLINICAL DATA:  Central line placement EXAM: PORTABLE CHEST 1 VIEW COMPARISON:  12/11/2021 FINDINGS: New left IJ approach central line tip overlies the central SVC. Right IJ approach dialysis catheter is partially imaged. The tip overlies the superior right atrium. No pneumothorax. Decreased pulmonary edema. No significant pleural effusion. Similar cardiomediastinal contours. IMPRESSION: New left IJ central line tip overlies SVC. No pneumothorax. Decreased pulmonary edema. Electronically Signed   By: Macy Mis M.D.   On: 12/12/2021 12:00   CT Angio Chest PE W and/or Wo Contrast  Result Date: 12/11/2021 CLINICAL DATA:  Elevated D-dimer level.  Dialysis patient. EXAM: CT ANGIOGRAPHY CHEST WITH CONTRAST TECHNIQUE: Multidetector CT imaging of the chest was performed using the standard protocol during bolus administration of intravenous contrast. Multiplanar CT image reconstructions and MIPs were obtained to evaluate the vascular anatomy. RADIATION DOSE REDUCTION: This exam was performed according to the departmental dose-optimization program which includes automated exposure control, adjustment of the mA and/or kV according to patient size and/or use of iterative reconstruction technique. CONTRAST:  59m OMNIPAQUE IOHEXOL 350 MG/ML SOLN COMPARISON:   Chest radiograph 12/11/2021 FINDINGS: Cardiovascular: Acute pulmonary embolus in the anterior segmental branch of the left upper lobe pulmonary artery on image 121 series 5. Acute pulmonary embolus in the posterior basal segment right lower lobe pulmonary artery on image 239 series 5. No lobar or larger clot identified. Atherosclerotic calcifications in the descending thoracic aorta and left anterior descending coronary artery. Borderline cardiomegaly. The right IJ dialysis catheter tip is borderline low in position, at the junction of the right atrium and IVC. Mediastinum/Nodes: Unremarkable Lungs/Pleura: Small bilateral pleural effusions. There is some mild scattered bilateral ground-glass opacities. Secondary pulmonary lobular interstitial accentuation is present in both lungs. Appearance suggests pulmonary edema. Upper Abdomen: Unremarkable Musculoskeletal: Thoracic spondylosis. Review of the MIP images confirms the above findings. IMPRESSION: 1. Two small segmental pulmonary emboli, one in the anterior segmental branch of the left upper lobe pulmonary artery, and the other in  the posterior basal segment right lower lobe branch of the pulmonary artery. No lobar or greater pulmonary embolus identified. 2. Borderline cardiomegaly with secondary pulmonary lobular interstitial accentuation, small bilateral pleural effusions, and some patchy ground-glass opacities favoring acute pulmonary edema. 3. Aortic Atherosclerosis (ICD10-I70.0). Left anterior descending coronary artery atherosclerosis. 4. Borderline low position of the right IJ dialysis catheter, tip at the junction of the right atrium and the IVC. Critical Value/emergent results were called by telephone at the time of interpretation on 12/11/2021 at 11:54 am to provider Dr. Conni Slipper , who verbally acknowledged these results. Electronically Signed   By: Van Clines M.D.   On: 12/11/2021 11:56   DG Chest 2 View  Result Date: 12/11/2021 CLINICAL  DATA:  sob EXAM: CHEST - 2 VIEW COMPARISON:  None Available. FINDINGS: Diffuse interstitial opacities. Pulmonary vascular congestion. Mild enlargement the cardiac silhouette. Small pleural effusions. No visible pneumothorax. Right IJ large-bore central venous catheter with the tip projecting at the right atrium. IMPRESSION: Mild cardiomegaly, pulmonary vascular congestion, interstitial pulmonary edema, and small pleural effusions. Electronically Signed   By: Margaretha Sheffield M.D.   On: 12/11/2021 08:07     Medications:    anticoagulant sodium citrate     heparin 1,150 Units/hr (12/12/21 1600)    amLODipine  5 mg Oral Daily   atorvastatin  10 mg Oral BH-q7a   Chlorhexidine Gluconate Cloth  6 each Topical Daily   [START ON 12/17/2021] cholecalciferol  5,000 Units Oral Weekly   dorzolamide-timolol  1 drop Both Eyes BID   lactulose  20 g Oral BID   losartan  50 mg Oral QHS   metoprolol tartrate  25 mg Oral Daily   molnupiravir EUA  4 capsule Oral BID   Netarsudil-Latanoprost  1 drop Both Eyes QPM   pantoprazole  20 mg Oral Daily   acetaminophen **OR** acetaminophen, alteplase, anticoagulant sodium citrate, fluticasone, guaiFENesin, guaiFENesin-dextromethorphan, heparin, hydrALAZINE, HYDROcodone-acetaminophen, lidocaine (PF), lidocaine-prilocaine, melatonin, morphine injection, ondansetron **OR** ondansetron (ZOFRAN) IV, pentafluoroprop-tetrafluoroeth, polyethylene glycol, senna-docusate  Assessment/ Plan:  Mr. BRYCEN BEAN is a 66 y.o.  male with past medical history including hypertension, GERD, hyperlipidemia, and end-stage renal disease on hemodialysis.  Patient presents to the emergency department with shortness of breath with activity.  Patient has been admitted for Elevated troponin [R77.8] Bilateral pulmonary embolism (HCC) [I26.99] Congestive heart failure, unspecified HF chronicity, unspecified heart failure type ([place]) [I50.9] Multiple subsegmental pulmonary emboli without acute cor  pulmonale (HCC) [I26.94]  [facility]/MWF/left aVF  End-stage renal disease requiring hemodialysis.  Will maintain outpatient schedule if possible.  Patient received full treatment at outpatient clinic on Monday.  Patient will receive regularly scheduled dialysis treatment today, UF goal 1 L as tolerated.  Next treatment scheduled for Friday.  2. Anemia of chronic kidney disease Lab Results  Component Value Date   HGB 12.2 (L) 12/12/2021    Hemoglobin within desired range.  We will continue to monitor.  Patient receives [facility] outpatient.  3. Secondary Hyperparathyroidism: Lab Results  Component Value Date   CALCIUM 8.2 (L) 12/12/2021   CAION 1.05 (L) 11/08/2021    Calcium within acceptable range.  We will continue to monitor bone minerals during this admission.  Continue cholecalciferol.  4.  Hypertension with chronic kidney disease.  Home regimen includes amlodipine, furosemide, losartan and metoprolol.  Currently receiving these medications.  5.  Bilateral pulmonary embolisms.  Patient currently on heparin drip managed by primary team    LOS: 1 [place] 7/19/20234:42 PM

## 2021-12-12 NOTE — Progress Notes (Signed)
[place] for initiation and monitoring of heparin infusion Indication: pulmonary embolus  Allergies  Allergen Reactions   Cefuroxime Rash    TOLERATED CEFAZOLIN   Tramadol Nausea Only and Other (See Comments)    Dizziness    Patient Measurements: Height: '5\' 6"'$  (167.6 cm) Weight: 78.2 kg (172 lb 6.4 oz) IBW/kg (Calculated) : 63.8 Heparin Dosing Weight: 77.5 kg  Vital Signs: Temp: 98.6 F (37 C) (07/19 0400) BP: 182/105 (07/19 1145) Pulse Rate: 91 (07/19 1145)  Labs: Recent Labs    12/11/21 0726 12/11/21 0850 12/11/21 1325 12/11/21 1500 12/11/21 1852 12/11/21 2129 12/12/21 1133  HGB 12.0*  --   --   --   --   --  12.2*  HCT 38.4*  --   --   --   --   --  39.2  PLT 215  --   --   --   --   --  217  APTT  --  37*  --   --   --   --   --   LABPROT  --  14.3  --   --   --   --   --   INR  --  1.1  --   --   --   --   --   HEPARINUNFRC  --   --   --   --  0.82*  --  0.12*  CREATININE 9.34*  --   --   --   --   --  11.77*  TROPONINIHS 64* 122*   < > 249* 287* 284*  --    < > = values in this interval not displayed.     Estimated Creatinine Clearance: 5.9 mL/min (A) (by C-G formula based on SCr of 11.77 mg/dL (H)).   Medical History: Past Medical History:  Diagnosis Date   Anemia    Arthritis    Cerebral palsy (HCC)    DDD (degenerative disc disease), lumbar    Deficiency of vitamin B12    Encephalitis    ESRD (end stage renal disease) on dialysis (HCC)    M-W-F   GERD (gastroesophageal reflux disease)    Hemorrhoid    Hypercholesterolemia    Hyperkalemia    Hypertension    PONV (postoperative nausea and vomiting)    n/v after 08-16-21 capd revision   Tremor    Tubular adenoma of colon    Vitamin D deficiency   Heparin Dosing Weight: 77.5 kg   Assessment: 66 year old male w/ PMH of HLD, HTN, GERD, ESRD on HD who presents emergency department for chief concerns of dyspnea with exertion. CT angio revealed two small  segmental pulmonary emboli. A review of the chart reveals no chronic anticoagulation prior to admission   Date Time aPTT/HL Rate/Comment 0718 1852  0.82  1300>1150 un/hr 0719 0900 --  Unable to get blood draw. Heparin stopped for CVC placement 0719 1230 --  Heparin resumed at prior rate, no initial bolus d/t new line.   Baseline Labs: aPTT - 37s INR - 1.1 Hgb - 12>12.2 (stable/LLN) Plts - 215 (stable/WNL)   Goal of Therapy:  Heparin level 0.3-0.7 units/ml Monitor platelets by anticoagulation protocol: Yes   Plan:  Resume heparin infusion at 1150 units/hr at 7/19 1230.  Will skip initial bolus d/t recent CVC placement. Check anti-Xa level in 8 hours and daily while on heparin Continue to monitor H&H and platelets  Lorna Dibble 12/12/2021,12:12 PM

## 2021-12-12 NOTE — Progress Notes (Signed)
HD end at 1730. 53.9L processed and 1L removed during 3 hr tx. Pt alert, no c/o, and report to primary RN. Persistent, dry cough remains. Catheter limbs locked with Heparin 1000u, 1.65m each.

## 2021-12-12 NOTE — Evaluation (Signed)
Clinical/Bedside Swallow Evaluation Patient Details  Name: James Black MRN: [id number] Date of Birth: [birth year]  Today's Date: 12/12/2021 Time: SLP Start Time (ACUTE ONLY): 3220 SLP Stop Time (ACUTE ONLY): 2542 SLP Time Calculation (min) (ACUTE ONLY): 50 min  Past Medical History:  Past Medical History:  Diagnosis Date   Anemia    Arthritis    Cerebral palsy ([place])    DDD (degenerative disc disease), lumbar    Deficiency of vitamin B12    Encephalitis    ESRD (end stage renal disease) on dialysis ([place])    M-W-F   GERD (gastroesophageal reflux disease)    Hemorrhoid    Hypercholesterolemia    Hyperkalemia    Hypertension    PONV (postoperative nausea and vomiting)    n/v after 08-16-21 capd revision   Tremor    Tubular adenoma of colon    Vitamin D deficiency    Past Surgical History:  Past Surgical History:  Procedure Laterality Date   AV FISTULA PLACEMENT Left 12/06/2021   Procedure: INSERTION OF ARTERIOVENOUS (AV) GORE-TEX GRAFT ARM (BRACHIAL AXILLARY);  Surgeon: Algernon Huxley, MD;  Location: [facility] ORS;  Service: Vascular;  Laterality: Left;   CAPD INSERTION N/A 08/10/2020   Procedure: LAPAROSCOPIC INSERTION CONTINUOUS AMBULATORY PERITONEAL DIALYSIS  (CAPD) CATHETER;  Surgeon: Jules Husbands, MD;  Location: [facility] ORS;  Service: General;  Laterality: N/A;   CAPD REMOVAL N/A 11/08/2021   Procedure: REMOVAL CONTINUOUS AMBULATORY PERITONEAL DIALYSIS  (CAPD) CATHETER;  Surgeon: Jules Husbands, MD;  Location: [facility] ORS;  Service: General;  Laterality: N/A;   CAPD REVISION N/A 08/16/2021   Procedure: LAPAROSCOPIC REVISION CONTINUOUS AMBULATORY PERITONEAL DIALYSIS  (CAPD) CATHETER;  Surgeon: Jules Husbands, MD;  Location: [facility] ORS;  Service: General;  Laterality: N/A;   COLONOSCOPY     2003, 2011   COLONOSCOPY WITH PROPOFOL N/A 05/25/2015   Procedure: COLONOSCOPY WITH PROPOFOL;  Surgeon: Manya Silvas, MD;  Location: [facility] ENDOSCOPY;  Service: Endoscopy;  Laterality: N/A;    COLONOSCOPY WITH PROPOFOL N/A 05/12/2020   Procedure: COLONOSCOPY WITH PROPOFOL;  Surgeon: Lesly Rubenstein, MD;  Location: [facility] ENDOSCOPY;  Service: Endoscopy;  Laterality: N/A;   ESOPHAGOGASTRODUODENOSCOPY     2003, 2011   ESOPHAGOGASTRODUODENOSCOPY (EGD) WITH PROPOFOL N/A 05/12/2020   Procedure: ESOPHAGOGASTRODUODENOSCOPY (EGD) WITH PROPOFOL;  Surgeon: Lesly Rubenstein, MD;  Location: [facility] ENDOSCOPY;  Service: Endoscopy;  Laterality: N/A;   FRACTURE SURGERY Left 1997   arm   XI ROBOTIC ASSISTED INGUINAL HERNIA REPAIR WITH MESH Right 08/23/2021   Procedure: XI ROBOTIC ASSISTED INGUINAL HERNIA REPAIR WITH MESH;  Surgeon: Jules Husbands, MD;  Location: [facility] ORS;  Service: General;  Laterality: Right;   HPI:  Pt is a 66 year old male with history of Cerebral Palsy, hyperlipidemia, hypertension, GERD, end-stage renal disease on hemodialysis, who presents emergency department for chief concerns of dyspnea with exertion. In the ED, At bedside he is able to tell me his full name, his age, the current calendar year, and he knows he is in the hospital.  He was able to identify his niece at bedside.   CXR at admit: Mild cardiomegaly, pulmonary vascular congestion, interstitial  pulmonary edema, and small pleural effusions -- decreased per CXR today.    Assessment / Plan / Recommendation  Clinical Impression   Pt seen for BSE today. HD initiating session in room during. Pt was alert, awake, and verbally responsive to direct questions re: him. Pt stated he "does not like to eat after 5pm". NSG  reported pt had "gagging episodes" last night; hacking coughing also. Unsure if this was impacted by coughing d/t COVID+; being given po's at night. Pt denied any swallowing problems stating he "ate sandwiches fine at home".   Pt appears to present w/ adequate oropharyngeal phase swallow function w/ No oropharyngeal phase dysphagia noted, No neuromuscular deficits noted. Pt consumed po trials w/ No overt, clinical  s/s of aspiration during po trials. Pt appears at reduced risk for aspiration following general aspiration precautions and supported w/ sitting up in bed for eating/drinking.   During po trials, pt consumed all consistencies w/ no overt coughing, decline in vocal quality, or change in respiratory presentation during/post trials. O2 sats 98%. Pt took quite small sip via Cup; denied difficulty or discomfort when swallowing. Oral phase appeared [facility] w/ timely bolus management, mastication, and control of bolus propulsion for A-P transfer for swallowing. Oral clearing achieved w/ all trial consistencies. OM Exam appeared [facility] w/ no unilateral weakness noted. Speech Clear though low volume. Pt fed self w/ setup support.   Recommend a Regular consistency diet w/ well-Cut meats, moistened foods; Thin liquids VIA CUP - pt does not use straws at home. Recommend general aspiration precautions, GERD/REFLUX precautions. Tray setup at meals including sitting up. Pills 1 at a time w/ water vs WHOLE in puree for ease/safer swallowing IF any difficulty swallowing pills. Education given on Pills in Puree if needed; food consistencies and easy to eat options; foods of preference; general aspiration precautions. NSG to reconsult if any new needs arise. NSG agreed. SLP Visit Diagnosis: Dysphagia, unspecified (R13.10)    Aspiration Risk   (reduced following general aspiration precs.)    Diet Recommendation   Regular consistency diet w/ well-Cut meats, moistened foods; Thin liquids VIA CUP - pt does not use straws at home. Recommend general aspiration precautions, GERD/REFLUX precautions. Tray setup at meals including sitting up.   Medication Administration: Whole meds with liquid (vs Whole in a puree IF needed for ease of swallowing)    Other  Recommendations Recommended Consults:  (n/a) Oral Care Recommendations: Oral care BID;Oral care before and after PO;Patient independent with oral care (support pt) Other  Recommendations:  (n/a)    Recommendations for follow up therapy are one component of a multi-disciplinary discharge planning process, led by the attending physician.  Recommendations may be updated based on patient status, additional functional criteria and insurance authorization.  Follow up Recommendations No SLP follow up      Assistance Recommended at Discharge Set up Supervision/Assistance  Functional Status Assessment Patient has had a recent decline in their functional status and demonstrates the ability to make significant improvements in function in a reasonable and predictable amount of time.  Frequency and Duration  (n/a)   (n/a)       Prognosis Prognosis for Safe Diet Advancement: Good Barriers to Reach Goals: Severity of deficits;Time post onset (unsure of full Cognitive status)      Swallow Study   General Date of Onset: 12/11/21 HPI: Pt is a 66 year old male with history of Cerebral Palsy, hyperlipidemia, hypertension, GERD, end-stage renal disease on hemodialysis, who presents emergency department for chief concerns of dyspnea with exertion. In the ED, At bedside he is able to tell me his full name, his age, the current calendar year, and he knows he is in the hospital.  He was able to identify his niece at bedside.   CXR at admit: Mild cardiomegaly, pulmonary vascular congestion, interstitial  pulmonary edema, and small pleural effusions --  decreased per CXR today. Type of Study: Bedside Swallow Evaluation Previous Swallow Assessment: none Diet Prior to this Study: NPO Temperature Spikes Noted: No (wbc 8.5) Respiratory Status: Nasal cannula (2L) History of Recent Intubation: No Behavior/Cognition: Alert;Cooperative;Pleasant mood (cerebral palsy at baseline) Oral Cavity Assessment: Within Functional Limits Oral Care Completed by SLP: Yes Oral Cavity - Dentition: Adequate natural dentition Vision: Functional for self-feeding Self-Feeding Abilities: Able to feed  self;Needs set up Patient Positioning: Upright in bed (supported positioning) Baseline Vocal Quality: Low vocal intensity Volitional Cough: Strong Volitional Swallow: Able to elicit    Oral/Motor/Sensory Function Overall Oral Motor/Sensory Function: Within functional limits   Ice Chips Ice chips: Within functional limits Presentation: Spoon (fed; 3 trials)   Thin Liquid Thin Liquid: Within functional limits Presentation: Cup;Self Fed (~5-6 ozs total) Other Comments: water, juice    Nectar Thick Nectar Thick Liquid: Not tested   Honey Thick Honey Thick Liquid: Not tested   Puree Puree: Within functional limits Presentation: Spoon;Self Fed (8 trials)   Solid     Solid: Within functional limits Presentation: Self Fed (5-6 tirals) Other Comments: graham crackers        Orinda Kenner, MS, CCC-SLP Speech Language Pathologist Rehab Services; [place] [phone] (ascom) Lianni Kanaan 12/12/2021,4:40 PM

## 2021-12-12 NOTE — Assessment & Plan Note (Signed)
Meets criteria BMI greater than 25 

## 2021-12-12 NOTE — Progress Notes (Signed)
HD end 

## 2021-12-12 NOTE — Progress Notes (Signed)
Unable to start pt treatment d/t water alarms on machine. Pt rinsed back successfully, catheter flushed, will change HD machine and restart treatment. Pt alert, no c/o, vss.

## 2021-12-12 NOTE — Progress Notes (Addendum)
0705 patient on heparin gtt 3 attempts no success in getting labs to adjust drip called dialysis in hope to get his treatment asap to obtain some labs. Informed team and pharmacy, waiting orders at this time. 0800 unable to give AM meds waiting on speech to clear patient for PO intake patient alert x4 able to make needs known. 1100 consent for HD and CVC obtained 1130 CVC placed pt tolerated well, labs sent 1230 Heparin restarted per orders, KVO connected to CVC per protocol

## 2021-12-13 DIAGNOSIS — I2699 Other pulmonary embolism without acute cor pulmonale: Secondary | ICD-10-CM | POA: Diagnosis not present

## 2021-12-13 DIAGNOSIS — N186 End stage renal disease: Secondary | ICD-10-CM | POA: Diagnosis not present

## 2021-12-13 DIAGNOSIS — I2694 Multiple subsegmental pulmonary emboli without acute cor pulmonale: Secondary | ICD-10-CM | POA: Diagnosis not present

## 2021-12-13 DIAGNOSIS — U071 COVID-19: Secondary | ICD-10-CM

## 2021-12-13 DIAGNOSIS — Z992 Dependence on renal dialysis: Secondary | ICD-10-CM

## 2021-12-13 DIAGNOSIS — D631 Anemia in chronic kidney disease: Secondary | ICD-10-CM

## 2021-12-13 LAB — HEPARIN LEVEL (UNFRACTIONATED): Heparin Unfractionated: 0.33 IU/mL (ref 0.30–0.70)

## 2021-12-13 LAB — CBC
HCT: 36.9 % — ABNORMAL LOW (ref 39.0–52.0)
Hemoglobin: 11.6 g/dL — ABNORMAL LOW (ref 13.0–17.0)
MCH: 28 pg (ref 26.0–34.0)
MCHC: 31.4 g/dL (ref 30.0–36.0)
MCV: 88.9 fL (ref 80.0–100.0)
Platelets: 207 10*3/uL (ref 150–400)
RBC: 4.15 MIL/uL — ABNORMAL LOW (ref 4.22–5.81)
RDW: 16.3 % — ABNORMAL HIGH (ref 11.5–15.5)
WBC: 8.4 10*3/uL (ref 4.0–10.5)
nRBC: 0 % (ref 0.0–0.2)

## 2021-12-13 LAB — TROPONIN I (HIGH SENSITIVITY): Troponin I (High Sensitivity): 116 ng/L (ref <18)

## 2021-12-13 LAB — HEPATITIS B SURFACE ANTIBODY, QUANTITATIVE: Hep B S AB Quant (Post): <3.1 m[IU]/mL — ABNORMAL LOW (ref >9.9)

## 2021-12-13 MED ORDER — APIXABAN 5 MG PO TABS: 5.0000 mg | ORAL_TABLET | Freq: Two times a day (BID) | ORAL | Status: DC

## 2021-12-13 MED ORDER — APIXABAN 5 MG PO TABS
10.0000 mg | ORAL_TABLET | Freq: Two times a day (BID) | ORAL | Status: DC
  Administered 2021-12-13 – 2021-12-16 (×7): 10 mg via ORAL
  Filled 2021-12-13 (×7): qty 2

## 2021-12-13 MED ORDER — HYDRALAZINE HCL 25 MG PO TABS
25.0000 mg | ORAL_TABLET | Freq: Three times a day (TID) | ORAL | Status: DC
  Administered 2021-12-13 – 2021-12-16 (×9): 25 mg via ORAL
  Filled 2021-12-13 (×10): qty 1

## 2021-12-13 NOTE — Care Management Important Message (Signed)
Important Message  Patient Details  Name: James Black MRN: [id number] Date of Birth: 29-Sep-1955   Medicare Important Message Given:  N/A - LOS <3 / Initial given by admissions     Dannette Barbara 12/13/2021, 2:28 PM

## 2021-12-13 NOTE — Progress Notes (Signed)
[facility]  ROUNDING NOTE   Subjective:   James Black is a 66 year old male with past medical history including hypertension, GERD, hyperlipidemia, and end-stage renal disease on hemodialysis.  Patient presents to the emergency department with shortness of breath with activity.  Patient has been admitted for Elevated troponin [R77.8] Bilateral pulmonary embolism (HCC) [I26.99] Congestive heart failure, unspecified HF chronicity, unspecified heart failure type ([place]) [I50.9] Multiple subsegmental pulmonary emboli without acute cor pulmonale ([place]) [I26.94]  Patient is known to our practice and receives outpatient dialysis treatments at [facility] on a MWF schedule, supervised by Dr. Candiss Norse.   Update Patient seen sitting up in bed Tolerating breakfast without nausea or vomiting 1 L nasal cannula, denies cough No lower extremity edema Dialysis tolerated well yesterday   Objective:  Vital signs in last 24 hours:  Temp:  [97.8 F (36.6 C)-98.3 F (36.8 C)] 98.1 F (36.7 C) (07/20 1201) Pulse Rate:  [82-110] 89 (07/20 1201) Resp:  [13-22] 17 (07/20 1201) BP: (142-206)/(87-194) 149/87 (07/20 1201) SpO2:  [97 %-100 %] 100 % (07/20 1201) Weight:  [73.1 kg-78.2 kg] 74.6 kg (07/20 0501)  Weight change: -0.8 kg Filed Weights   12/12/21 1325 12/13/21 0437 12/13/21 0501  Weight: 78.2 kg 73.1 kg 74.6 kg    Intake/Output: I/O last 3 completed shifts: In: 685.4 [P.O.:340; I.V.:345.4] Out: 1300 [Urine:300; Other:1000]   Intake/Output this shift:  Total I/O In: 322.2 [P.O.:240; I.V.:82.2] Out: -   Physical Exam: General: NAD  Head: Normocephalic, atraumatic. Moist oral mucosal membranes  Eyes: Anicteric  Lungs:  Diminished in bases, normal effort, [place] O2  Heart: Regular rate and rhythm  Abdomen:  Soft, nontender, obese  Extremities: No peripheral edema.  Neurologic: Nonfocal, moving all four extremities  Skin: No lesions  Access: Left aVF    Basic  Metabolic Panel: Recent Labs  Lab 12/06/21 1350 12/11/21 0726 12/12/21 1133  NA  --  136 136  K 3.9 4.4 4.7  CL  --  104 103  CO2  --  22 22  GLUCOSE  --  111* 91  BUN  --  37* 54*  CREATININE  --  9.34* 11.77*  CALCIUM  --  8.1* 8.2*     Liver Function Tests: No results for input(s): "AST", "ALT", "ALKPHOS", "BILITOT", "PROT", "ALBUMIN" in the last 168 hours. No results for input(s): "LIPASE", "AMYLASE" in the last 168 hours. No results for input(s): "AMMONIA" in the last 168 hours.  CBC: Recent Labs  Lab 12/06/21 1350 12/11/21 0726 12/12/21 1133 12/13/21 0515  WBC  --  9.8 8.5 8.4  HGB 12.8* 12.0* 12.2* 11.6*  HCT  --  38.4* 39.2 36.9*  MCV  --  89.3 88.9 88.9  PLT  --  215 217 207     Cardiac Enzymes: No results for input(s): "CKTOTAL", "CKMB", "CKMBINDEX", "TROPONINI" in the last 168 hours.  BNP: Invalid input(s): "POCBNP"  CBG: Recent Labs  Lab 12/11/21 1724  GLUCAP 134*     Microbiology: Results for orders placed or performed during the hospital encounter of 12/11/21  MRSA Next Gen by PCR, Nasal     Status: None   Collection Time: 12/11/21  5:44 PM   Specimen: Nasal Mucosa; Nasal Swab  Result Value Ref Range Status   MRSA by PCR Next Gen NOT DETECTED NOT DETECTED Final    Comment: (NOTE) The GeneXpert MRSA Assay (FDA approved for NASAL specimens only), is one component of a comprehensive MRSA colonization surveillance program. It is not  intended to diagnose MRSA infection nor to guide or monitor treatment for MRSA infections. Test performance is not FDA approved in patients less than 49 years old. Performed at [facility], [address] [place], [place] [address]   SARS Coronavirus 2 by RT PCR (hospital order, performed in [facility] hospital lab) *cepheid single result test* Nasal Mucosa     Status: Abnormal   Collection Time: 12/11/21  5:44 PM   Specimen: Nasal Mucosa; Nasal Swab  Result Value Ref Range Status   SARS  Coronavirus 2 by RT PCR POSITIVE (A) NEGATIVE Final    Comment: (NOTE) SARS-CoV-2 target nucleic acids are DETECTED  SARS-CoV-2 RNA is generally detectable in upper respiratory specimens  during the acute phase of infection.  Positive results are indicative  of the presence of the identified virus, but do not rule out bacterial infection or co-infection with other pathogens not detected by the test.  Clinical correlation with patient history and  other diagnostic information is necessary to determine patient infection status.  The expected result is negative.  Fact Sheet for Patients:   https://www.patel.info/   Fact Sheet for Healthcare Providers:   https://hall.com/    This test is not yet approved or cleared by the [place] FDA and  has been authorized for detection and/or diagnosis of SARS-CoV-2 by FDA under an Emergency Use Authorization (EUA).  This EUA will remain in effect (meaning this test can be used) for the duration of  the COVID-19 declaration under Section 564(b)(1)  of the Act, 21 U.S.C. section 360-bbb-3(b)(1), unless the authorization is terminated or revoked sooner.   Performed at [facility], [address] [place], [place] [address]     Coagulation Studies: Recent Labs    12/11/21 0850  LABPROT 14.3  INR 1.1     Urinalysis: No results for input(s): "COLORURINE", "LABSPEC", "PHURINE", "GLUCOSEU", "HGBUR", "BILIRUBINUR", "KETONESUR", "PROTEINUR", "UROBILINOGEN", "NITRITE", "LEUKOCYTESUR" in the last 72 hours.  Invalid input(s): "APPERANCEUR"    Imaging: ECHOCARDIOGRAM COMPLETE  Result Date: 12/12/2021    ECHOCARDIOGRAM REPORT   Patient Name:   James Black Date of Exam: 12/12/2021 Medical Rec #:  [id number]      Height:       66.0 in Accession #:    [id number]     Weight:       172.4 lb Date of Birth:  December 30, 1955       BSA:          1.877 m Patient Age:    55 years       BP:            172/108 mmHg Patient Gender: M              HR:           96 bpm. Exam Location:  [facility] Procedure: 2D Echo, Cardiac Doppler and Color Doppler Indications:     Pulmonary Embolus I26.09  History:         Patient has no prior history of Echocardiogram examinations.                  Risk Factors:Hypertension.  Sonographer:     [name] Referring Phys:  [id number] AMY N COX Diagnosing Phys: Yolonda Kida MD  Sonographer Comments: Suboptimal apical window. IMPRESSIONS  1. Left ventricular ejection fraction, by estimation, is 50 to 55%. The left ventricle has low normal function. The left ventricle has no regional wall motion abnormalities. There is mild left  ventricular hypertrophy. Left ventricular diastolic parameters are consistent with Grade I diastolic dysfunction (impaired relaxation).  2. Right ventricular systolic function is normal. The right ventricular size is normal.  3. The mitral valve is normal in structure. Trivial mitral valve regurgitation.  4. The aortic valve is normal in structure. Aortic valve regurgitation is not visualized. FINDINGS  Left Ventricle: Left ventricular ejection fraction, by estimation, is 50 to 55%. The left ventricle has low normal function. The left ventricle has no regional wall motion abnormalities. The left ventricular internal cavity size was normal in size. There is mild left ventricular hypertrophy. Left ventricular diastolic parameters are consistent with Grade I diastolic dysfunction (impaired relaxation). Right Ventricle: The right ventricular size is normal. No increase in right ventricular wall thickness. Right ventricular systolic function is normal. Left Atrium: Left atrial size was normal in size. Right Atrium: Right atrial size was normal in size. Pericardium: There is no evidence of pericardial effusion. Mitral Valve: The mitral valve is normal in structure. Trivial mitral valve regurgitation. Tricuspid Valve: The tricuspid valve is normal in structure. Tricuspid  valve regurgitation is mild. Aortic Valve: The aortic valve is normal in structure. Aortic valve regurgitation is not visualized. Aortic valve mean gradient measures 2.0 mmHg. Aortic valve peak gradient measures 4.2 mmHg. Aortic valve area, by VTI measures 3.71 cm. Pulmonic Valve: The pulmonic valve was normal in structure. Pulmonic valve regurgitation is not visualized. Aorta: The ascending aorta was not well visualized. IAS/Shunts: No atrial level shunt detected by color flow Doppler.  LEFT VENTRICLE PLAX 2D LVIDd:         4.70 cm   Diastology LVIDs:         3.40 cm   LV e' medial:    4.79 cm/s LV PW:         1.20 cm   LV E/e' medial:  12.3 LV IVS:        1.20 cm   LV e' lateral:   7.62 cm/s LVOT diam:     2.20 cm   LV E/e' lateral: 7.8 LV SV:         60 LV SV Index:   32 LVOT Area:     3.80 cm  RIGHT VENTRICLE RV Basal diam:  3.80 cm RV S prime:     11.70 cm/s TAPSE (M-mode): 2.1 cm LEFT ATRIUM             Index        RIGHT ATRIUM           Index LA diam:        3.80 cm 2.02 cm/m   RA Area:     18.80 cm LA Vol (A2C):   58.9 ml 31.37 ml/m  RA Volume:   55.90 ml  29.77 ml/m LA Vol (A4C):   53.3 ml 28.39 ml/m LA Biplane Vol: 57.4 ml 30.57 ml/m  AORTIC VALVE AV Area (Vmax):    2.90 cm AV Area (Vmean):   2.86 cm AV Area (VTI):     3.71 cm AV Vmax:           102.50 cm/s AV Vmean:          66.800 cm/s AV VTI:            0.161 m AV Peak Grad:      4.2 mmHg AV Mean Grad:      2.0 mmHg LVOT Vmax:         78.30 cm/s LVOT Vmean:  50.200 cm/s LVOT VTI:          0.157 m LVOT/AV VTI ratio: 0.98  AORTA Ao Root diam: 3.70 cm MITRAL VALVE               TRICUSPID VALVE MV Area (PHT): 6.37 cm    TR Peak grad:   18.8 mmHg MV Decel Time: 119 msec    TR Vmax:        217.00 cm/s MV E velocity: 59.10 cm/s MV A velocity: 69.00 cm/s  SHUNTS MV E/A ratio:  0.86        Systemic VTI:  0.16 m                            Systemic Diam: 2.20 cm Yolonda Kida MD Electronically signed by Yolonda Kida MD Signature  Date/Time: 12/12/2021/12:52:15 PM    Final    DG Chest Port 1 View  Result Date: 12/12/2021 CLINICAL DATA:  Central line placement EXAM: PORTABLE CHEST 1 VIEW COMPARISON:  12/11/2021 FINDINGS: New left IJ approach central line tip overlies the central SVC. Right IJ approach dialysis catheter is partially imaged. The tip overlies the superior right atrium. No pneumothorax. Decreased pulmonary edema. No significant pleural effusion. Similar cardiomediastinal contours. IMPRESSION: New left IJ central line tip overlies SVC. No pneumothorax. Decreased pulmonary edema. Electronically Signed   By: Macy Mis M.D.   On: 12/12/2021 12:00     Medications:    anticoagulant sodium citrate      amLODipine  5 mg Oral Daily   apixaban  10 mg Oral BID   Followed by   Derrill Memo ON 12/20/2021] apixaban  5 mg Oral BID   atorvastatin  10 mg Oral [id number]   Chlorhexidine Gluconate Cloth  6 each Topical Daily   [START ON 12/17/2021] cholecalciferol  5,000 Units Oral Weekly   dorzolamide-timolol  1 drop Both Eyes BID   hydrALAZINE  25 mg Oral Q8H   lactulose  20 g Oral BID   losartan  50 mg Oral QHS   metoprolol tartrate  25 mg Oral Daily   molnupiravir EUA  4 capsule Oral BID   pantoprazole  20 mg Oral Daily   acetaminophen **OR** acetaminophen, alteplase, anticoagulant sodium citrate, fluticasone, guaiFENesin, guaiFENesin-dextromethorphan, heparin, hydrALAZINE, HYDROcodone-acetaminophen, lidocaine (PF), lidocaine-prilocaine, melatonin, morphine injection, ondansetron **OR** ondansetron (ZOFRAN) IV, pentafluoroprop-tetrafluoroeth, polyethylene glycol, senna-docusate  Assessment/ Plan:  [name] is a 66 y.o.  male with past medical history including hypertension, GERD, hyperlipidemia, and end-stage renal disease on hemodialysis.  Patient presents to the emergency department with shortness of breath with activity.  Patient has been admitted for Elevated troponin [R77.8] Bilateral pulmonary embolism (HCC)  [I26.99] Congestive heart failure, unspecified HF chronicity, unspecified heart failure type ([place]) [I50.9] Multiple subsegmental pulmonary emboli without acute cor pulmonale (HCC) [I26.94]  [facility]/MWF/left aVF  End-stage renal disease requiring hemodialysis.  Will maintain outpatient schedule if possible.    Patient received dialysis yesterday, UF 1 L achieved.  Next dialysis treatment scheduled for Friday.  2. Anemia of chronic kidney disease Lab Results  Component Value Date   HGB 11.6 (L) 12/13/2021    Patient receives Ethel outpatient.  Hemoglobin remains at goal, no need for ESA's at this time.  3. Secondary Hyperparathyroidism: Lab Results  Component Value Date   CALCIUM 8.2 (L) 12/12/2021   CAION 1.05 (L) 11/08/2021    We will continue to monitor bone minerals during this  admission.  Continue cholecalciferol.  4.  Hypertension with chronic kidney disease.  Home regimen includes amlodipine, furosemide, losartan and metoprolol.  Currently receiving these medications.  Blood pressure 149/87, acceptable for this patient.  5.  Bilateral pulmonary embolisms.  Patient currently on heparin drip.  Primary team managing and considering transition to oral anticoagulation.    LOS: 2 Nicosha Struve 7/20/202312:36 PM

## 2021-12-13 NOTE — Progress Notes (Signed)
[facility] Progress Note  Patient: James Black    NFA:[id number]  DOA: 12/11/2021    Date of Service: the patient was seen and examined on 12/13/2021  Brief hospital course: 66 year old male with history of hyperlipidemia, hypertension, GERD, end-stage renal disease on hemodialysis, who presented to the emergency department on 7/18 for dyspnea on exertion.  Work-up revealed 2 small pulmonary emboli, as well as malignant hypertension with systolic blood pressures ranging from the 160s to 200.  BNP elevated at 1900.  COVID test positive.  Initial troponin at 64, however repeat troponins further elevated in the 100s and 200s.  Admitted to the hospitalist service and nephrology consulted for dialysis.  Critical care consulted on 7/19 for central line placement for lab draw.  Patient underwent dialysis on 7/19.  Assessment and Plan: Assessment and Plan: * Bilateral pulmonary embolism ([place]) Likely secondary to COVID-19 infection.  No evidence of heart strain.  Complete echocardiogram ordered.  Currently on heparin, changing over to Eliquis.  Malignant hypertension Secondary to volume overload from needing dialysis.  Continued on his Norvasc, ARB and beta-blocker.  As needed hydralazine.  Blood pressures better today ranging from the 140s to 160s.  COVID-19 virus infection Started on molnupiravir twice daily x5 days.  Airborne precautions.  Likely causing blood clot, but patient himself is not hypoxic.  Down to 1 L although more for comfort  End stage renal disease ([place]) Status post - 12/06/21: Insertion of arteriovenous Gore-Tex graft into the left brachial axillary for hemodialysis access Seen by nephrology with dialysis as per his regular schedule today, next dialysis for Friday  Elevated troponin Likely some demand ischemia secondary to his volume overload from dialysis as well as PE.  Complete echocardiogram pending.  On heparin drip.  Anemia due to chronic kidney disease, on chronic  dialysis ([place]) Stable.  Hemoglobin at 12.2 on 7/19.  Hypercholesterolemia - Patient takes atorvastatin 10 mg daily  Gastro-esophageal reflux disease without esophagitis - PPI  Overweight (BMI 25.0-29.9) Meets criteria BMI greater than 25       Body mass index is 26.55 kg/m.        Consultants: Nephrology Critical care  Procedures: Central line placement 7/19 for IV access for labs Hemodialysis 7/19 Echocardiogram 7/19 noting grade 1 diastolic dysfunction and preserved ejection fraction  Antimicrobials: Molnupiravir day 3/5  Code Status: Full code   Subjective: Feels better today.  Objective: Noted elevated blood pressures Vitals:   12/13/21 0732 12/13/21 1201  BP: (!) 147/90 (!) 149/87  Pulse: 93 89  Resp: 17 17  Temp: 98 F (36.7 C) 98.1 F (36.7 C)  SpO2: 100% 100%    Intake/Output Summary (Last 24 hours) at 12/13/2021 1424 Last data filed at 12/13/2021 1000 Gross per 24 hour  Intake 822.14 ml  Output 1000 ml  Net -177.86 ml    Filed Weights   12/12/21 1325 12/13/21 0437 12/13/21 0501  Weight: 78.2 kg 73.1 kg 74.6 kg   Body mass index is 26.55 kg/m.  Exam:  General: Alert and oriented x3, no acute distress HEENT: Normocephalic and atraumatic, mucous membranes are moist Cardiovascular: Regular rate and rhythm, S1-S2 Respiratory: Clear to auscultation bilaterally Abdomen: Soft, nontender, nondistended, positive bowel sounds Musculoskeletal: No clubbing or cyanosis, trace pitting edema Skin: No skin breaks, tears or lesions Psychiatry: Appropriate, no evidence of psychoses Neurology: No focal deficits  Data Reviewed: Hemoglobin at 11.6.  Troponins trending downward  Disposition:  Status is: Inpatient Remains inpatient appropriate because: Blood pressure remaining stable-monitoring Tolerance  of Eliquis    Anticipated discharge date: 7/21  Family Communication: Left message for family DVT Prophylaxis: Place TED hose Start:  12/11/21 1229 apixaban (ELIQUIS) tablet 10 mg  apixaban (ELIQUIS) tablet 5 mg    Author: Annita Brod ,MD 12/13/2021 2:24 PM  To reach On-call, see care teams to locate the attending and reach out via [email or web address]. Between 7PM-7AM, please contact night-coverage If you still have difficulty reaching the attending provider, please page the [facility] (Director on Call) for [facility] on amion for assistance.

## 2021-12-13 NOTE — Progress Notes (Addendum)
[place] for initiation and monitoring of heparin infusion Indication: pulmonary embolus  Allergies  Allergen Reactions   Cefuroxime Rash    TOLERATED CEFAZOLIN   Tramadol Nausea Only and Other (See Comments)    Dizziness    Patient Measurements: Height: '5\' 6"'$  (167.6 cm) Weight: 74.6 kg (164 lb 7.4 oz) IBW/kg (Calculated) : 63.8 Heparin Dosing Weight: 77.5 kg  Vital Signs: Temp: 98.1 F (36.7 C) (07/20 0442) Temp Source: Oral (07/20 0442) BP: 156/90 (07/20 0442) Pulse Rate: 82 (07/20 0442)  Labs: Recent Labs    12/11/21 0726 12/11/21 0850 12/11/21 1325 12/11/21 1500 12/11/21 1852 12/11/21 1852 12/11/21 2129 12/12/21 1133 12/12/21 2016 12/13/21 0515  HGB 12.0*  --   --   --   --   --   --  12.2*  --  11.6*  HCT 38.4*  --   --   --   --   --   --  39.2  --  36.9*  PLT 215  --   --   --   --   --   --  217  --  207  APTT  --  37*  --   --   --   --   --   --   --   --   LABPROT  --  14.3  --   --   --   --   --   --   --   --   INR  --  1.1  --   --   --   --   --   --   --   --   HEPARINUNFRC  --   --   --   --  0.82*   < >  --  0.12* 0.58 0.33  CREATININE 9.34*  --   --   --   --   --   --  11.77*  --   --   TROPONINIHS 64* 122*   < > 249* 287*  --  284*  --   --   --    < > = values in this interval not displayed.     Estimated Creatinine Clearance: 5.4 mL/min (A) (by C-G formula based on SCr of 11.77 mg/dL (H)).   Medical History: Past Medical History:  Diagnosis Date   Anemia    Arthritis    Cerebral palsy (HCC)    DDD (degenerative disc disease), lumbar    Deficiency of vitamin B12    Encephalitis    ESRD (end stage renal disease) on dialysis (HCC)    M-W-F   GERD (gastroesophageal reflux disease)    Hemorrhoid    Hypercholesterolemia    Hyperkalemia    Hypertension    PONV (postoperative nausea and vomiting)    n/v after 08-16-21 capd revision   Tremor    Tubular adenoma of colon    Vitamin D  deficiency   Heparin Dosing Weight: 77.5 kg   Assessment: 66 year old male w/ PMH of HLD, HTN, GERD, ESRD on HD who presents emergency department for chief concerns of dyspnea with exertion. CT angio revealed two small segmental pulmonary emboli. A review of the chart reveals no chronic anticoagulation prior to admission   Date Time aPTT/HL Rate/Comment 0718 1852  0.82  1300>1150 un/hr 0719 0900 --  Unable to get blood draw. Heparin stopped for CVC placement 0719 1230 --  Heparin resumed at prior rate, no initial bolus d/t new  line.  0720 2016 0.58  Continue 1150 un/hr/ Therapeutic x1 0720 0515 0.33  Therapeutic x 2  Baseline Labs: aPTT - 37s INR - 1.1 Hgb - 12>12.2 (stable/LLN) Plts - 215 (stable/WNL)   Goal of Therapy:  Heparin level 0.3-0.7 units/ml Monitor platelets by anticoagulation protocol: Yes   Plan:  HL therapeutic x 2 but trending down Increase heparin infusion slightly to  1200 units/hr.  Check anti-Xa level in 8 hours to confirm Continue to monitor H&H and platelets  Dorothe Pea, PharmD, BCPS Clinical Pharmacist   12/13/2021,5:54 AM

## 2021-12-14 ENCOUNTER — Telehealth (HOSPITAL_COMMUNITY): Payer: Self-pay | Admitting: Pharmacy Technician

## 2021-12-14 ENCOUNTER — Other Ambulatory Visit (HOSPITAL_COMMUNITY): Payer: Self-pay

## 2021-12-14 DIAGNOSIS — N186 End stage renal disease: Secondary | ICD-10-CM | POA: Diagnosis not present

## 2021-12-14 DIAGNOSIS — I2694 Multiple subsegmental pulmonary emboli without acute cor pulmonale: Secondary | ICD-10-CM | POA: Diagnosis not present

## 2021-12-14 DIAGNOSIS — I2699 Other pulmonary embolism without acute cor pulmonale: Secondary | ICD-10-CM | POA: Diagnosis not present

## 2021-12-14 DIAGNOSIS — U071 COVID-19: Secondary | ICD-10-CM | POA: Diagnosis not present

## 2021-12-14 LAB — CBC
HCT: 35 % — ABNORMAL LOW (ref 39.0–52.0)
Hemoglobin: 10.7 g/dL — ABNORMAL LOW (ref 13.0–17.0)
MCH: 27.2 pg (ref 26.0–34.0)
MCHC: 30.6 g/dL (ref 30.0–36.0)
MCV: 88.8 fL (ref 80.0–100.0)
Platelets: 181 10*3/uL (ref 150–400)
RBC: 3.94 MIL/uL — ABNORMAL LOW (ref 4.22–5.81)
RDW: 15.9 % — ABNORMAL HIGH (ref 11.5–15.5)
WBC: 7.8 10*3/uL (ref 4.0–10.5)
nRBC: 0 % (ref 0.0–0.2)

## 2021-12-14 NOTE — TOC Benefit Eligibility Note (Signed)
Patient Teacher, English as a foreign language completed.    The patient is currently admitted and upon discharge could be taking Eliquis 5 .  The current 30 day co-pay is, $0.00.   The patient is insured through [place], [place] Patient Advocate Specialist [place] Patient Advocate Team Direct Number: [phone]  Fax: [phone]

## 2021-12-14 NOTE — Telephone Encounter (Signed)
Pharmacy Patient Advocate Encounter  Insurance verification completed.    The patient is insured through [employer] Part D   The patient is currently admitted and ran test claims for the following: Eliquis .  Copays and coinsurance results were relayed to Inpatient clinical team.      

## 2021-12-14 NOTE — Progress Notes (Signed)
[facility] Progress Note  Patient: James Black    HDQ:[id number]  DOA: 12/11/2021    Date of Service: the patient was seen and examined on 12/14/2021  Brief hospital course: 66 year old male with history of hyperlipidemia, hypertension, GERD, end-stage renal disease on hemodialysis, who presented to the emergency department on 7/18 for dyspnea on exertion.  Work-up revealed 2 small pulmonary emboli, as well as malignant hypertension with systolic blood pressures ranging from the 160s to 200.  BNP elevated at 1900.  COVID test positive.  Initial troponin at 64, however repeat troponins further elevated in the 100s and 200s.  Admitted to the hospitalist service and nephrology consulted for dialysis.  Critical care consulted on 7/19 for central line placement for lab draw.  Patient underwent dialysis on 7/19. Blood pressures have since improved.  Due to extenuating circumstances, patient unable to get dialysis on 7/21.  Assessment and Plan: Assessment and Plan: * Bilateral pulmonary embolism ([place]) Likely secondary to COVID-19 infection.  No evidence of heart strain.  Complete echocardiogram ordered.  Currently on heparin, changing over to Eliquis.  Malignant hypertension Secondary to volume overload from needing dialysis.  Continued on his Norvasc, ARB and beta-blocker.  As needed hydralazine.  Blood pressures better today ranging from the 140s to 160s.  COVID-19 virus infection Started on molnupiravir twice daily x5 days.  Airborne precautions.  Likely causing blood clot, but patient himself is not hypoxic.  Down to 1 L although more for comfort  End stage renal disease ([place]) Status post - 12/06/21: Insertion of arteriovenous Gore-Tex graft into the left brachial axillary for hemodialysis access Seen by nephrology with dialysis as per his regular schedule.  Unable to get dialysis today and getting dialysis tomorrow.  Elevated troponin Likely some demand ischemia secondary to his volume  overload from dialysis as well as PE.  Complete echocardiogram pending.  On heparin drip.  Anemia due to chronic kidney disease, on chronic dialysis ([place]) Stable.  Hemoglobin at 12.2 on 7/19.  Hypercholesterolemia - Patient takes atorvastatin 10 mg daily  Gastro-esophageal reflux disease without esophagitis - PPI  Overweight (BMI 25.0-29.9) Meets criteria BMI greater than 25       Body mass index is 26.87 kg/m.        Consultants: Nephrology Critical care  Procedures: Central line placement 7/19 for IV access for labs Hemodialysis 7/19, 7/22 Echocardiogram 7/19 noting grade 1 diastolic dysfunction and preserved ejection fraction  Antimicrobials: Molnupiravir day 3/5  Code Status: Full code   Subjective: Feels okay, no complaints  Objective: Noted elevated blood pressures Vitals:   12/14/21 1136 12/14/21 1600  BP: (!) 142/88 124/78  Pulse: 84 85  Resp:  16  Temp:  98.2 F (36.8 C)  SpO2: 97%     Intake/Output Summary (Last 24 hours) at 12/14/2021 1654 Last data filed at 12/14/2021 0414 Gross per 24 hour  Intake 600 ml  Output 200 ml  Net 400 ml    Filed Weights   12/13/21 0437 12/13/21 0501 12/14/21 0355  Weight: 73.1 kg 74.6 kg 75.5 kg   Body mass index is 26.87 kg/m.  Exam:  General: Alert and oriented x3, no acute distress HEENT: Normocephalic and atraumatic, mucous membranes are moist Cardiovascular: Regular rate and rhythm, S1-S2 Respiratory: Clear to auscultation bilaterally Abdomen: Soft, nontender, nondistended, positive bowel sounds Musculoskeletal: No clubbing or cyanosis, trace pitting edema Skin: No skin breaks, tears or lesions Psychiatry: Appropriate, no evidence of psychoses Neurology: No focal deficits  Data Reviewed: Hemoglobin at  10.7.  Disposition:  Status is: Inpatient Remains inpatient appropriate because: Dialysis session on 7/22    Anticipated discharge date: 7/22 after dialysis  Family Communication: Left  message for family DVT Prophylaxis: Place TED hose Start: 12/11/21 1229 apixaban (ELIQUIS) tablet 10 mg  apixaban (ELIQUIS) tablet 5 mg    Author: Annita Brod ,MD 12/14/2021 4:54 PM  To reach On-call, see care teams to locate the attending and reach out via [email or web address]. Between 7PM-7AM, please contact night-coverage If you still have difficulty reaching the attending provider, please page the [facility] (Director on Call) for [facility] on amion for assistance.

## 2021-12-14 NOTE — Progress Notes (Signed)
[facility]  ROUNDING NOTE   Subjective:   James Black is a 66 year old male with past medical history including hypertension, GERD, hyperlipidemia, and end-stage renal disease on hemodialysis.  Patient presents to the emergency department with shortness of breath with activity.  Patient has been admitted for Elevated troponin [R77.8] Bilateral pulmonary embolism (HCC) [I26.99] Congestive heart failure, unspecified HF chronicity, unspecified heart failure type ([place]) [I50.9] Multiple subsegmental pulmonary emboli without acute cor pulmonale ([place]) [I26.94]  Patient is known to our practice and receives outpatient dialysis treatments at [facility] on a MWF schedule, supervised by Dr. Candiss Norse.   Update Patient sitting up in bed Currently eating breakfast with no complaints or complications Remains on room air States he feels well today No lower extremity edema Weaned off heparin drip  Dialysis planned for later today   Objective:  Vital signs in last 24 hours:  Temp:  [97.7 F (36.5 C)-98.8 F (37.1 C)] 98.8 F (37.1 C) (07/21 0816) Pulse Rate:  [84-94] 84 (07/21 1136) Resp:  [15-19] 15 (07/21 0349) BP: (142-158)/(88-99) 142/88 (07/21 1136) SpO2:  [97 %-100 %] 97 % (07/21 1136) Weight:  [75.5 kg] 75.5 kg (07/21 0355)  Weight change: -2.7 kg Filed Weights   12/13/21 0437 12/13/21 0501 12/14/21 0355  Weight: 73.1 kg 74.6 kg 75.5 kg    Intake/Output: I/O last 3 completed shifts: In: 1316.2 [P.O.:1080; I.V.:236.2] Out: 200 [Urine:200]   Intake/Output this shift:  No intake/output data recorded.  Physical Exam: General: NAD  Head: Normocephalic, atraumatic. Moist oral mucosal membranes  Eyes: Anicteric  Lungs:  Diminished in bases, normal effort, [place] O2  Heart: Regular rate and rhythm  Abdomen:  Soft, nontender, obese  Extremities: No peripheral edema.  Neurologic: Nonfocal, moving all four extremities  Skin: No lesions  Access: Left aVF     Basic Metabolic Panel: Recent Labs  Lab 12/11/21 0726 12/12/21 1133  NA 136 136  K 4.4 4.7  CL 104 103  CO2 22 22  GLUCOSE 111* 91  BUN 37* 54*  CREATININE 9.34* 11.77*  CALCIUM 8.1* 8.2*     Liver Function Tests: No results for input(s): "AST", "ALT", "ALKPHOS", "BILITOT", "PROT", "ALBUMIN" in the last 168 hours. No results for input(s): "LIPASE", "AMYLASE" in the last 168 hours. No results for input(s): "AMMONIA" in the last 168 hours.  CBC: Recent Labs  Lab 12/11/21 0726 12/12/21 1133 12/13/21 0515 12/14/21 0400  WBC 9.8 8.5 8.4 7.8  HGB 12.0* 12.2* 11.6* 10.7*  HCT 38.4* 39.2 36.9* 35.0*  MCV 89.3 88.9 88.9 88.8  PLT 215 217 207 181     Cardiac Enzymes: No results for input(s): "CKTOTAL", "CKMB", "CKMBINDEX", "TROPONINI" in the last 168 hours.  BNP: Invalid input(s): "POCBNP"  CBG: Recent Labs  Lab 12/11/21 1724  GLUCAP 134*     Microbiology: Results for orders placed or performed during the hospital encounter of 12/11/21  MRSA Next Gen by PCR, Nasal     Status: None   Collection Time: 12/11/21  5:44 PM   Specimen: Nasal Mucosa; Nasal Swab  Result Value Ref Range Status   MRSA by PCR Next Gen NOT DETECTED NOT DETECTED Final    Comment: (NOTE) The GeneXpert MRSA Assay (FDA approved for NASAL specimens only), is one component of a comprehensive MRSA colonization surveillance program. It is not intended to diagnose MRSA infection nor to guide or monitor treatment for MRSA infections. Test performance is not FDA approved in patients less than 55 years old. Performed at  [address] [place], [place] [address]   SARS Coronavirus 2 by RT PCR (hospital order, performed in [facility] hospital lab) *cepheid single result test* Nasal Mucosa     Status: Abnormal   Collection Time: 12/11/21  5:44 PM   Specimen: Nasal Mucosa; Nasal Swab  Result Value Ref Range Status   SARS Coronavirus 2 by RT PCR POSITIVE (A) NEGATIVE  Final    Comment: (NOTE) SARS-CoV-2 target nucleic acids are DETECTED  SARS-CoV-2 RNA is generally detectable in upper respiratory specimens  during the acute phase of infection.  Positive results are indicative  of the presence of the identified virus, but do not rule out bacterial infection or co-infection with other pathogens not detected by the test.  Clinical correlation with patient history and  other diagnostic information is necessary to determine patient infection status.  The expected result is negative.  Fact Sheet for Patients:   https://www.patel.info/   Fact Sheet for Healthcare Providers:   https://hall.com/    This test is not yet approved or cleared by the [place] FDA and  has been authorized for detection and/or diagnosis of SARS-CoV-2 by FDA under an Emergency Use Authorization (EUA).  This EUA will remain in effect (meaning this test can be used) for the duration of  the COVID-19 declaration under Section 564(b)(1)  of the Act, 21 U.S.C. section 360-bbb-3(b)(1), unless the authorization is terminated or revoked sooner.   Performed at [facility], [address] [place], [place] [address]     Coagulation Studies: No results for input(s): "LABPROT", "INR" in the last 72 hours.   Urinalysis: No results for input(s): "COLORURINE", "LABSPEC", "PHURINE", "GLUCOSEU", "HGBUR", "BILIRUBINUR", "KETONESUR", "PROTEINUR", "UROBILINOGEN", "NITRITE", "LEUKOCYTESUR" in the last 72 hours.  Invalid input(s): "APPERANCEUR"    Imaging: No results found.   Medications:    anticoagulant sodium citrate      amLODipine  5 mg Oral Daily   apixaban  10 mg Oral BID   Followed by   Derrill Memo ON 12/20/2021] apixaban  5 mg Oral BID   atorvastatin  10 mg Oral [id number]   Chlorhexidine Gluconate Cloth  6 each Topical Daily   [START ON 12/17/2021] cholecalciferol  5,000 Units Oral Weekly   dorzolamide-timolol  1 drop Both  Eyes BID   hydrALAZINE  25 mg Oral Q8H   lactulose  20 g Oral BID   losartan  50 mg Oral QHS   metoprolol tartrate  25 mg Oral Daily   molnupiravir EUA  4 capsule Oral BID   pantoprazole  20 mg Oral Daily   acetaminophen **OR** acetaminophen, alteplase, anticoagulant sodium citrate, fluticasone, guaiFENesin-dextromethorphan, heparin, hydrALAZINE, HYDROcodone-acetaminophen, lidocaine (PF), lidocaine-prilocaine, melatonin, morphine injection, ondansetron **OR** ondansetron (ZOFRAN) IV, pentafluoroprop-tetrafluoroeth, polyethylene glycol, senna-docusate  Assessment/ Plan:  [name] is a 66 y.o.  male with past medical history including hypertension, GERD, hyperlipidemia, and end-stage renal disease on hemodialysis.  Patient presents to the emergency department with shortness of breath with activity.  Patient has been admitted for Elevated troponin [R77.8] Bilateral pulmonary embolism (HCC) [I26.99] Congestive heart failure, unspecified HF chronicity, unspecified heart failure type ([place]) [I50.9] Multiple subsegmental pulmonary emboli without acute cor pulmonale (HCC) [I26.94]  [facility]/MWF/left aVF  End-stage renal disease requiring hemodialysis.  Will maintain outpatient schedule if possible.    Patient scheduled for dialysis later today, UF goal 1 L as tolerated.  Next treatment scheduled for Monday.  2. Anemia of chronic kidney disease Lab Results  Component  Value Date   HGB 10.7 (L) 12/14/2021    Patient receives [facility] outpatient.  Hemoglobin at goal 3. Secondary Hyperparathyroidism: Lab Results  Component Value Date   CALCIUM 8.2 (L) 12/12/2021   CAION 1.05 (L) 11/08/2021    Calcium remains within acceptable target.  Continue cholecalciferol.  4.  Hypertension with chronic kidney disease.  Home regimen includes amlodipine, furosemide, losartan and metoprolol.  Currently receiving these medications.  Blood pressure 142/88  5.  Bilateral pulmonary  embolisms.    Patient weaned off heparin drip now on Eliquis.    LOS: 3 James Black 7/21/20232:08 PM

## 2021-12-14 NOTE — Plan of Care (Signed)

## 2021-12-15 DIAGNOSIS — I2694 Multiple subsegmental pulmonary emboli without acute cor pulmonale: Secondary | ICD-10-CM | POA: Diagnosis not present

## 2021-12-15 DIAGNOSIS — E871 Hypo-osmolality and hyponatremia: Secondary | ICD-10-CM

## 2021-12-15 DIAGNOSIS — I2699 Other pulmonary embolism without acute cor pulmonale: Secondary | ICD-10-CM | POA: Diagnosis not present

## 2021-12-15 DIAGNOSIS — I10 Essential (primary) hypertension: Secondary | ICD-10-CM | POA: Diagnosis not present

## 2021-12-15 LAB — RENAL FUNCTION PANEL
Albumin: 3.1 g/dL — ABNORMAL LOW (ref 3.5–5.0)
Anion gap: 13 (ref 5–15)
BUN: 57 mg/dL — ABNORMAL HIGH (ref 8–23)
CO2: 19 mmol/L — ABNORMAL LOW (ref 22–32)
Calcium: 8 mg/dL — ABNORMAL LOW (ref 8.9–10.3)
Chloride: 91 mmol/L — ABNORMAL LOW (ref 98–111)
Creatinine, Ser: 11.68 mg/dL — ABNORMAL HIGH (ref 0.61–1.24)
GFR, Estimated: 4 mL/min — ABNORMAL LOW (ref >60)
Glucose, Bld: 118 mg/dL — ABNORMAL HIGH (ref 70–99)
Phosphorus: 6.3 mg/dL — ABNORMAL HIGH (ref 2.5–4.6)
Potassium: 4 mmol/L (ref 3.5–5.1)
Sodium: 123 mmol/L — ABNORMAL LOW (ref 135–145)

## 2021-12-15 LAB — CBC
HCT: 34.7 % — ABNORMAL LOW (ref 39.0–52.0)
Hemoglobin: 11.1 g/dL — ABNORMAL LOW (ref 13.0–17.0)
MCH: 27.6 pg (ref 26.0–34.0)
MCHC: 32 g/dL (ref 30.0–36.0)
MCV: 86.3 fL (ref 80.0–100.0)
Platelets: 220 10*3/uL (ref 150–400)
RBC: 4.02 MIL/uL — ABNORMAL LOW (ref 4.22–5.81)
RDW: 15.6 % — ABNORMAL HIGH (ref 11.5–15.5)
WBC: 8 10*3/uL (ref 4.0–10.5)
nRBC: 0 % (ref 0.0–0.2)

## 2021-12-15 MED ORDER — HEPARIN SODIUM (PORCINE) 1000 UNIT/ML IJ SOLN
INTRAMUSCULAR | Status: AC
  Filled 2021-12-15: qty 10

## 2021-12-15 NOTE — TOC Initial Note (Signed)
Transition of Care [facility]) - Initial/Assessment Note    Patient Details  Name: James Black MRN: [id number] Date of Birth: February 01, 1956  Transition of Care [facility]) CM/SW Contact:    Laurena Slimmer, RN Phone Number: 12/15/2021, 2:29 PM  Clinical Narrative:                  Transition of Care [facility]) Screening Note   Patient Details  Name: James Black Date of Birth: [birth year]   Transition of Care [facility]) CM/SW Contact:    Laurena Slimmer, RN Phone Number: 12/15/2021, 2:29 PM    Transition of Care Department [facility]) has reviewed patient and no TOC needs have been identified at this time. We will continue to monitor patient advancement through interdisciplinary progression rounds. If new patient transition needs arise, please place a TOC consult.          Patient Goals and CMS Choice        Expected Discharge Plan and Services                                                Prior Living Arrangements/Services                       Activities of Daily Living Home Assistive Devices/Equipment: None ADL Screening (condition at time of admission) Patient's cognitive ability adequate to safely complete daily activities?: Yes Is the patient deaf or have difficulty hearing?: No Does the patient have difficulty seeing, even when wearing glasses/contacts?: No Does the patient have difficulty concentrating, remembering, or making decisions?: No Patient able to express need for assistance with ADLs?: Yes Does the patient have difficulty dressing or bathing?: No Independently performs ADLs?: Yes (appropriate for developmental age) Does the patient have difficulty walking or climbing stairs?: No Weakness of Legs: None Weakness of Arms/Hands: None  Permission Sought/Granted                  Emotional Assessment              Admission diagnosis:  Elevated troponin [R77.8] Bilateral pulmonary embolism (HCC) [I26.99] Congestive heart failure,  unspecified HF chronicity, unspecified heart failure type ([place]) [I50.9] Multiple subsegmental pulmonary emboli without acute cor pulmonale ([place]) [I26.94] Patient Active Problem List   Diagnosis Date Noted   Overweight (BMI 25.0-29.9) 12/12/2021   Bilateral pulmonary embolism ([place]) 12/11/2021   Anemia due to chronic kidney disease, on chronic dialysis ([place]) 12/11/2021   Elevated troponin 12/11/2021   COVID-19 virus infection 12/11/2021   End stage renal disease ([place]) 10/24/2021   History of adenomatous polyp of colon 05/12/2020   Anemia due to stage 5 chronic kidney disease, not on chronic dialysis ([place]) 01/20/2020   Family history of colon cancer 01/20/2020   Allergic rhinitis 11/30/2019   Chronic kidney disease, stage 3 unspecified ([place]) 11/30/2019   Gastro-esophageal reflux disease without esophagitis 11/30/2019   Hypercholesterolemia 11/30/2019   Other intervertebral disc degeneration, lumbar region 11/30/2019   Static encephalopathy 11/30/2019   Vasomotor rhinitis 11/30/2019   Vitamin D deficiency 11/30/2019   Chronic gout due to renal impairment without tophus 04/25/2016   Body mass index (BMI) 39.0-39.9, adult 07/06/2015   Asymptomatic proteinuria 07/08/2014   Malignant hypertension 12/30/2013   Tremor 12/30/2013   PCP:  Sofie Hartigan, MD Pharmacy:   [facility] [phone] -  [place], [place] [address] AT [place] [facility] [place] [address] Phone: [phone] Fax: [phone]     Social Determinants of Health (SDOH) Interventions    Readmission Risk Interventions     No data to display

## 2021-12-15 NOTE — Progress Notes (Signed)
[facility]  ROUNDING NOTE   Subjective:   James Black is a 66 year old male with past medical history including hypertension, GERD, hyperlipidemia, and end-stage renal disease on hemodialysis.  Patient presents to the emergency department with shortness of breath with activity.  Patient has been admitted for Elevated troponin [R77.8] Bilateral pulmonary embolism (HCC) [I26.99] Congestive heart failure, unspecified HF chronicity, unspecified heart failure type ([place]) [I50.9] Multiple subsegmental pulmonary emboli without acute cor pulmonale ([place]) [I26.94]  Patient is known to our practice and receives outpatient dialysis treatments at [facility] on a MWF schedule, supervised by Dr. Candiss Norse.   Update Patient resting quietly Remains on room air No lower extremity edema Will receive dialysis later today   Objective:  Vital signs in last 24 hours:  Temp:  [97.9 F (36.6 C)-98.8 F (37.1 C)] 97.9 F (36.6 C) (07/22 0551) Pulse Rate:  [84-99] 99 (07/22 0551) Resp:  [16-19] 19 (07/22 0551) BP: (124-162)/(78-103) 144/88 (07/22 0551) SpO2:  [95 %-99 %] 99 % (07/22 0551) Weight:  [78.3 kg] 78.3 kg (07/22 0551)  Weight change: 2.8 kg Filed Weights   12/13/21 0501 12/14/21 0355 12/15/21 0551  Weight: 74.6 kg 75.5 kg 78.3 kg    Intake/Output: I/O last 3 completed shifts: In: 600 [P.O.:600] Out: 200 [Urine:200]   Intake/Output this shift:  No intake/output data recorded.  Physical Exam: General: NAD  Head: Normocephalic, atraumatic. Moist oral mucosal membranes  Eyes: Anicteric  Lungs:  Diminished in bases, normal effort, [place] O2  Heart: Regular rate and rhythm  Abdomen:  Soft, nontender, obese  Extremities: No peripheral edema.  Neurologic: Nonfocal, moving all four extremities  Skin: No lesions  Access: Left aVF    Basic Metabolic Panel: Recent Labs  Lab 12/11/21 0726 12/12/21 1133  NA 136 136  K 4.4 4.7  CL 104 103  CO2 22 22  GLUCOSE 111* 91   BUN 37* 54*  CREATININE 9.34* 11.77*  CALCIUM 8.1* 8.2*     Liver Function Tests: No results for input(s): "AST", "ALT", "ALKPHOS", "BILITOT", "PROT", "ALBUMIN" in the last 168 hours. No results for input(s): "LIPASE", "AMYLASE" in the last 168 hours. No results for input(s): "AMMONIA" in the last 168 hours.  CBC: Recent Labs  Lab 12/11/21 0726 12/12/21 1133 12/13/21 0515 12/14/21 0400  WBC 9.8 8.5 8.4 7.8  HGB 12.0* 12.2* 11.6* 10.7*  HCT 38.4* 39.2 36.9* 35.0*  MCV 89.3 88.9 88.9 88.8  PLT 215 217 207 181     Cardiac Enzymes: No results for input(s): "CKTOTAL", "CKMB", "CKMBINDEX", "TROPONINI" in the last 168 hours.  BNP: Invalid input(s): "POCBNP"  CBG: Recent Labs  Lab 12/11/21 1724  GLUCAP 134*     Microbiology: Results for orders placed or performed during the hospital encounter of 12/11/21  MRSA Next Gen by PCR, Nasal     Status: None   Collection Time: 12/11/21  5:44 PM   Specimen: Nasal Mucosa; Nasal Swab  Result Value Ref Range Status   MRSA by PCR Next Gen NOT DETECTED NOT DETECTED Final    Comment: (NOTE) The GeneXpert MRSA Assay (FDA approved for NASAL specimens only), is one component of a comprehensive MRSA colonization surveillance program. It is not intended to diagnose MRSA infection nor to guide or monitor treatment for MRSA infections. Test performance is not FDA approved in patients less than 79 years old. Performed at [facility], [address] [place], [place] [address]   SARS Coronavirus 2 by RT PCR (hospital order, performed  in [place] lab) *cepheid single result test* Nasal Mucosa     Status: Abnormal   Collection Time: 12/11/21  5:44 PM   Specimen: Nasal Mucosa; Nasal Swab  Result Value Ref Range Status   SARS Coronavirus 2 by RT PCR POSITIVE (A) NEGATIVE Final    Comment: (NOTE) SARS-CoV-2 target nucleic acids are DETECTED  SARS-CoV-2 RNA is generally detectable in upper respiratory specimens   during the acute phase of infection.  Positive results are indicative  of the presence of the identified virus, but do not rule out bacterial infection or co-infection with other pathogens not detected by the test.  Clinical correlation with patient history and  other diagnostic information is necessary to determine patient infection status.  The expected result is negative.  Fact Sheet for Patients:   https://www.patel.info/   Fact Sheet for Healthcare Providers:   https://hall.com/    This test is not yet approved or cleared by the [place] FDA and  has been authorized for detection and/or diagnosis of SARS-CoV-2 by FDA under an Emergency Use Authorization (EUA).  This EUA will remain in effect (meaning this test can be used) for the duration of  the COVID-19 declaration under Section 564(b)(1)  of the Act, 21 U.S.C. section 360-bbb-3(b)(1), unless the authorization is terminated or revoked sooner.   Performed at [facility], [address] [place], [place] [address]     Coagulation Studies: No results for input(s): "LABPROT", "INR" in the last 72 hours.   Urinalysis: No results for input(s): "COLORURINE", "LABSPEC", "PHURINE", "GLUCOSEU", "HGBUR", "BILIRUBINUR", "KETONESUR", "PROTEINUR", "UROBILINOGEN", "NITRITE", "LEUKOCYTESUR" in the last 72 hours.  Invalid input(s): "APPERANCEUR"    Imaging: No results found.   Medications:    anticoagulant sodium citrate      amLODipine  5 mg Oral Daily   apixaban  10 mg Oral BID   Followed by   Derrill Memo ON 12/20/2021] apixaban  5 mg Oral BID   atorvastatin  10 mg Oral BH-q7a   Chlorhexidine Gluconate Cloth  6 each Topical Daily   [START ON 12/17/2021] cholecalciferol  5,000 Units Oral Weekly   dorzolamide-timolol  1 drop Both Eyes BID   hydrALAZINE  25 mg Oral Q8H   lactulose  20 g Oral BID   losartan  50 mg Oral QHS   metoprolol tartrate  25 mg Oral Daily    molnupiravir EUA  4 capsule Oral BID   pantoprazole  20 mg Oral Daily   acetaminophen **OR** acetaminophen, alteplase, anticoagulant sodium citrate, fluticasone, guaiFENesin-dextromethorphan, heparin, hydrALAZINE, HYDROcodone-acetaminophen, lidocaine (PF), lidocaine-prilocaine, melatonin, morphine injection, ondansetron **OR** ondansetron (ZOFRAN) IV, pentafluoroprop-tetrafluoroeth, polyethylene glycol, senna-docusate  Assessment/ Plan:  James Black is a 66 y.o.  male with past medical history including hypertension, GERD, hyperlipidemia, and end-stage renal disease on hemodialysis.  Patient presents to the emergency department with shortness of breath with activity.  Patient has been admitted for Elevated troponin [R77.8] Bilateral pulmonary embolism (HCC) [I26.99] Congestive heart failure, unspecified HF chronicity, unspecified heart failure type ([place]) [I50.9] Multiple subsegmental pulmonary emboli without acute cor pulmonale (HCC) [I26.94]  [facility]/MWF/left aVF  End-stage renal disease requiring hemodialysis.  Will maintain outpatient schedule if possible.    Dialysis postponed yesterday due to dialysis unit concerns.  Patient will receive scheduled dialysis treatment today.  Next treatment scheduled for Monday if patient remains inpatient.  2. Anemia of chronic kidney disease Lab Results  Component Value Date   HGB 10.7 (L) 12/14/2021    Hemoglobin remains  at goal.  Patient receives [place] outpatient.  3. Secondary Hyperparathyroidism: Lab Results  Component Value Date   CALCIUM 8.2 (L) 12/12/2021   CAION 1.05 (L) 11/08/2021     Continue cholecalciferol.  4.  Hypertension with chronic kidney disease.  Home regimen includes amlodipine, furosemide, losartan and metoprolol.  Currently receiving these medications.  Blood pressure acceptable for this patient, 144/88  5.  Bilateral pulmonary embolisms.    Currently on Eliquis.    LOS: 4 Coren Crownover 7/22/20238:15 AM

## 2021-12-15 NOTE — Assessment & Plan Note (Addendum)
Initially scheduled to get dialysis on 7/21, but due to extenuating circumstances not done until 7/22.  Labs drawn prior to dialysis noted sodium 123.  This is likely hypervolemic dilutional.  The following day, sodium had started to trend back upward and was 126.  Expected to continue to trend upward.  Recheck labs this week during dialysis.

## 2021-12-15 NOTE — Progress Notes (Addendum)
[facility] Progress Note  Patient: James Black    JJH:[id number]  DOA: 12/11/2021    Date of Service: the patient was seen and examined on 12/15/2021  Brief hospital course: 66 year old male with history of hyperlipidemia, hypertension, GERD, end-stage renal disease on hemodialysis, who presented to the emergency department on 7/18 for dyspnea on exertion.  Work-up revealed 2 small pulmonary emboli, as well as malignant hypertension with systolic blood pressures ranging from the 160s to 200.  BNP elevated at 1900.  COVID test positive.  Initial troponin at 64, however repeat troponins further elevated in the 100s and 200s.  Admitted to the hospitalist service and nephrology consulted for dialysis.  Critical care consulted on 7/19 for central line placement for lab draw.  Patient underwent dialysis on 7/19. Blood pressures have since improved.  Due to extenuating circumstances, patient unable to get dialysis on 7/21 and received it on 7/22.  Noted to have a sodium of 123 today.  Assessment and Plan: Assessment and Plan: * Bilateral pulmonary embolism ([place]) Likely secondary to COVID-19 infection.  No evidence of heart strain.  Complete echocardiogram ordered.  Currently on heparin, changing over to Eliquis.  Hyponatremia Initially scheduled to get dialysis on 7/21, but due to extenuating circumstances not done until 7/22.  Labs drawn prior to dialysis noted sodium 123.  This is likely hypervolemic dilutional.  Recheck labs in the morning.  Malignant hypertension Secondary to volume overload from needing dialysis.  Continued on his Norvasc, ARB and beta-blocker.  As needed hydralazine.  Blood pressures better today ranging from the 140s to 150s.  COVID-19 virus infection Started on molnupiravir twice daily x5 days which is completed tomorrow.  Airborne precautions.  Likely causing blood clot, but patient himself is not hypoxic.  Down to 1 L although more for comfort  End stage renal  disease ([place]) Status post - 12/06/21: Insertion of arteriovenous Gore-Tex graft into the left brachial axillary for hemodialysis access Seen by nephrology with dialysis as per his regular schedule.  Dialysis today.  Elevated troponin Likely some demand ischemia secondary to his volume overload from dialysis as well as PE.  Complete echocardiogram pending.  On heparin drip.  Anemia due to chronic kidney disease, on chronic dialysis ([place]) Stable.  Hemoglobin at 12.2 on 7/19.  Hypercholesterolemia - Patient takes atorvastatin 10 mg daily  Gastro-esophageal reflux disease without esophagitis - PPI  Overweight (BMI 25.0-29.9) Meets criteria BMI greater than 25       Body mass index is 27.33 kg/m.        Consultants: Nephrology Critical care  Procedures: Central line placement 7/19 for IV access for labs Hemodialysis 7/19, 7/22 Echocardiogram 7/19 noting grade 1 diastolic dysfunction and preserved ejection fraction  Antimicrobials: Molnupiravir day 4/5  Code Status: Full code   Subjective: Feels better  Objective: Noted elevated blood pressures Vitals:   12/15/21 1630 12/15/21 1700  BP: 137/78 (!) 144/84  Pulse: 81 87  Resp: 15 14  Temp:    SpO2:      Intake/Output Summary (Last 24 hours) at 12/15/2021 1722 Last data filed at 12/14/2021 2145 Gross per 24 hour  Intake 240 ml  Output --  Net 240 ml   Filed Weights   12/14/21 0355 12/15/21 0551 12/15/21 1432  Weight: 75.5 kg 78.3 kg 76.8 kg   Body mass index is 27.33 kg/m.  Exam:  General: Alert and oriented x3, no acute distress HEENT: Normocephalic and atraumatic, mucous membranes are moist Cardiovascular: Regular rate and rhythm,  S1-S2 Respiratory: Clear to auscultation bilaterally Abdomen: Soft, nontender, nondistended, positive bowel sounds Musculoskeletal: No clubbing or cyanosis, trace pitting edema Skin: No skin breaks, tears or lesions Psychiatry: Appropriate, no evidence of  psychoses Neurology: No focal deficits  Data Reviewed: Noted sodium of 123  Disposition:  Status is: Inpatient Remains inpatient appropriate because: Dialysis, correction of hyponatremia    Anticipated discharge date: 7/23 after last dose of molnupiravir Family Communication: Left message for family DVT Prophylaxis: Place TED hose Start: 12/11/21 1229 apixaban (ELIQUIS) tablet 10 mg  apixaban (ELIQUIS) tablet 5 mg    Author: Annita Brod ,MD 12/15/2021 5:22 PM  To reach On-call, see care teams to locate the attending and reach out via [email or web address]. Between 7PM-7AM, please contact night-coverage If you still have difficulty reaching the attending provider, please page the [facility] (Director on Call) for [facility] on amion for assistance.

## 2021-12-15 NOTE — Progress Notes (Signed)
Received patient in bed, alert and oriented. Informed consent signed and in chart.  Time tx completed: 1803  HD treatment completed.  Patient tolerated well.  HD catheter without signs and symptoms of complications.  Patient transported back to the room, alert and orient and in no acute distress.  Report given to bedside RN.Aurora Zahourek  Total UF removed: 1.5 L  Medication given: 0  Post HD VS: bp 151/78   Hr 88   Rr 17   Tmp 98.4  Post HD weight: 75.1 kg

## 2021-12-16 DIAGNOSIS — E871 Hypo-osmolality and hyponatremia: Secondary | ICD-10-CM | POA: Diagnosis not present

## 2021-12-16 DIAGNOSIS — N186 End stage renal disease: Secondary | ICD-10-CM | POA: Diagnosis not present

## 2021-12-16 DIAGNOSIS — I2699 Other pulmonary embolism without acute cor pulmonale: Secondary | ICD-10-CM | POA: Diagnosis not present

## 2021-12-16 DIAGNOSIS — U071 COVID-19: Secondary | ICD-10-CM | POA: Diagnosis not present

## 2021-12-16 LAB — BASIC METABOLIC PANEL
Anion gap: 10 (ref 5–15)
BUN: 35 mg/dL — ABNORMAL HIGH (ref 8–23)
CO2: 25 mmol/L (ref 22–32)
Calcium: 7.6 mg/dL — ABNORMAL LOW (ref 8.9–10.3)
Chloride: 91 mmol/L — ABNORMAL LOW (ref 98–111)
Creatinine, Ser: 7.84 mg/dL — ABNORMAL HIGH (ref 0.61–1.24)
GFR, Estimated: 7 mL/min — ABNORMAL LOW (ref >60)
Glucose, Bld: 92 mg/dL (ref 70–99)
Potassium: 3.8 mmol/L (ref 3.5–5.1)
Sodium: 126 mmol/L — ABNORMAL LOW (ref 135–145)

## 2021-12-16 MED ORDER — GUAIFENESIN-DM 100-10 MG/5ML PO SYRP: 5.0000 mL | ORAL_SOLUTION | ORAL | 0 refills | Status: AC | PRN

## 2021-12-16 MED ORDER — APIXABAN (ELIQUIS) VTE STARTER PACK (10MG AND 5MG): ORAL_TABLET | ORAL | 0 refills | Status: AC

## 2021-12-16 MED ORDER — HYDRALAZINE HCL 25 MG PO TABS: 25.0000 mg | ORAL_TABLET | Freq: Three times a day (TID) | ORAL | 2 refills | Status: AC

## 2021-12-16 NOTE — Discharge Instructions (Signed)
Stay in isolation (except for dialysis) until 7/28.  (After Thursday ok to be around people without masks)

## 2021-12-16 NOTE — Progress Notes (Signed)
Received secure chat from MD to d/c L IJ access.

## 2021-12-16 NOTE — Progress Notes (Signed)
Patient discharged, stable, via wheelchair.  L IJ site w/pressure dressing. Discharge instructions discussed with patient and niece.  PIV d/cd.

## 2021-12-16 NOTE — Progress Notes (Signed)
[facility]  ROUNDING NOTE   Subjective:   James Black is a 66 year old male with past medical history including hypertension, GERD, hyperlipidemia, and end-stage renal disease on hemodialysis.  Patient presents to the emergency department with shortness of breath with activity.  Patient has been admitted for Elevated troponin [R77.8] Bilateral pulmonary embolism (HCC) [I26.99] Congestive heart failure, unspecified HF chronicity, unspecified heart failure type ([place]) [I50.9] Multiple subsegmental pulmonary emboli without acute cor pulmonale ([place]) [I26.94]  Patient is known to our practice and receives outpatient dialysis treatments at [facility] on a MWF schedule, supervised by Dr. Candiss Norse.   Update Patient sitting up in bed, currently completing breakfast Remains on room air, denies shortness of breath or cough No lower extremity edema States left access arm more swollen   Objective:  Vital signs in last 24 hours:  Temp:  [98.1 F (36.7 C)-98.6 F (37 C)] 98.1 F (36.7 C) (07/23 0900) Pulse Rate:  [78-99] 87 (07/23 0900) Resp:  [14-20] 18 (07/23 0900) BP: (128-165)/(78-90) 145/86 (07/23 0900) SpO2:  [95 %-100 %] 96 % (07/23 0900) Weight:  [75.1 kg-79.4 kg] 79.4 kg (07/23 0427)  Weight change: -1.5 kg Filed Weights   12/15/21 1432 12/15/21 1821 12/16/21 0427  Weight: 76.8 kg 75.1 kg 79.4 kg    Intake/Output: I/O last 3 completed shifts: In: 41 [P.O.:720] Out: 401.5 [Urine:400; Other:1.5]   Intake/Output this shift:  No intake/output data recorded.  Physical Exam: General: NAD  Head: Normocephalic, atraumatic. Moist oral mucosal membranes  Eyes: Anicteric  Lungs:  Diminished in bases, normal effort  Heart: Regular rate and rhythm  Abdomen:  Soft, nontender, obese  Extremities: No peripheral edema.  Neurologic: Nonfocal, moving all four extremities  Skin: No lesions  Access: Left aVF    Basic Metabolic Panel: Recent Labs  Lab  12/11/21 0726 12/12/21 1133 12/15/21 0600 12/16/21 0520  NA 136 136 123* 126*  K 4.4 4.7 4.0 3.8  CL 104 103 91* 91*  CO2 22 22 19* 25  GLUCOSE 111* 91 118* 92  BUN 37* 54* 57* 35*  CREATININE 9.34* 11.77* 11.68* 7.84*  CALCIUM 8.1* 8.2* 8.0* 7.6*  PHOS  --   --  6.3*  --      Liver Function Tests: Recent Labs  Lab 12/15/21 0600  ALBUMIN 3.1*   No results for input(s): "LIPASE", "AMYLASE" in the last 168 hours. No results for input(s): "AMMONIA" in the last 168 hours.  CBC: Recent Labs  Lab 12/11/21 0726 12/12/21 1133 12/13/21 0515 12/14/21 0400 12/15/21 0500  WBC 9.8 8.5 8.4 7.8 8.0  HGB 12.0* 12.2* 11.6* 10.7* 11.1*  HCT 38.4* 39.2 36.9* 35.0* 34.7*  MCV 89.3 88.9 88.9 88.8 86.3  PLT 215 217 207 181 220     Cardiac Enzymes: No results for input(s): "CKTOTAL", "CKMB", "CKMBINDEX", "TROPONINI" in the last 168 hours.  BNP: Invalid input(s): "POCBNP"  CBG: Recent Labs  Lab 12/11/21 1724  GLUCAP 134*     Microbiology: Results for orders placed or performed during the hospital encounter of 12/11/21  MRSA Next Gen by PCR, Nasal     Status: None   Collection Time: 12/11/21  5:44 PM   Specimen: Nasal Mucosa; Nasal Swab  Result Value Ref Range Status   MRSA by PCR Next Gen NOT DETECTED NOT DETECTED Final    Comment: (NOTE) The GeneXpert MRSA Assay (FDA approved for NASAL specimens only), is one component of a comprehensive MRSA colonization surveillance program. It is not intended to diagnose  MRSA infection nor to guide or monitor treatment for MRSA infections. Test performance is not FDA approved in patients less than 65 years old. Performed at [facility], [address] [place], [place] [address]   SARS Coronavirus 2 by RT PCR (hospital order, performed in [facility] hospital lab) *cepheid single result test* Nasal Mucosa     Status: Abnormal   Collection Time: 12/11/21  5:44 PM   Specimen: Nasal Mucosa; Nasal Swab  Result Value Ref  Range Status   SARS Coronavirus 2 by RT PCR POSITIVE (A) NEGATIVE Final    Comment: (NOTE) SARS-CoV-2 target nucleic acids are DETECTED  SARS-CoV-2 RNA is generally detectable in upper respiratory specimens  during the acute phase of infection.  Positive results are indicative  of the presence of the identified virus, but do not rule out bacterial infection or co-infection with other pathogens not detected by the test.  Clinical correlation with patient history and  other diagnostic information is necessary to determine patient infection status.  The expected result is negative.  Fact Sheet for Patients:   https://www.patel.info/   Fact Sheet for Healthcare Providers:   https://hall.com/    This test is not yet approved or cleared by the [place] FDA and  has been authorized for detection and/or diagnosis of SARS-CoV-2 by FDA under an Emergency Use Authorization (EUA).  This EUA will remain in effect (meaning this test can be used) for the duration of  the COVID-19 declaration under Section 564(b)(1)  of the Act, 21 U.S.C. section 360-bbb-3(b)(1), unless the authorization is terminated or revoked sooner.   Performed at [facility], [address] [place], [place] [address]     Coagulation Studies: No results for input(s): "LABPROT", "INR" in the last 72 hours.   Urinalysis: No results for input(s): "COLORURINE", "LABSPEC", "PHURINE", "GLUCOSEU", "HGBUR", "BILIRUBINUR", "KETONESUR", "PROTEINUR", "UROBILINOGEN", "NITRITE", "LEUKOCYTESUR" in the last 72 hours.  Invalid input(s): "APPERANCEUR"    Imaging: No results found.   Medications:    anticoagulant sodium citrate      amLODipine  5 mg Oral Daily   apixaban  10 mg Oral BID   Followed by   Derrill Memo ON 12/20/2021] apixaban  5 mg Oral BID   atorvastatin  10 mg Oral BH-q7a   Chlorhexidine Gluconate Cloth  6 each Topical Daily   [START ON 12/17/2021]  cholecalciferol  5,000 Units Oral Weekly   dorzolamide-timolol  1 drop Both Eyes BID   hydrALAZINE  25 mg Oral Q8H   lactulose  20 g Oral BID   losartan  50 mg Oral QHS   metoprolol tartrate  25 mg Oral Daily   molnupiravir EUA  4 capsule Oral BID   pantoprazole  20 mg Oral Daily   acetaminophen **OR** acetaminophen, alteplase, anticoagulant sodium citrate, fluticasone, guaiFENesin-dextromethorphan, heparin, hydrALAZINE, HYDROcodone-acetaminophen, lidocaine (PF), lidocaine-prilocaine, melatonin, morphine injection, ondansetron **OR** ondansetron (ZOFRAN) IV, pentafluoroprop-tetrafluoroeth, polyethylene glycol, senna-docusate  Assessment/ Plan:  James Black is a 66 y.o.  male with past medical history including hypertension, GERD, hyperlipidemia, and end-stage renal disease on hemodialysis.  Patient presents to the emergency department with shortness of breath with activity.  Patient has been admitted for Elevated troponin [R77.8] Bilateral pulmonary embolism (HCC) [I26.99] Congestive heart failure, unspecified HF chronicity, unspecified heart failure type ([place]) [I50.9] Multiple subsegmental pulmonary emboli without acute cor pulmonale (HCC) [I26.94]  [facility]/MWF/left aVF  End-stage renal disease requiring hemodialysis.  Will maintain outpatient schedule if possible.    Patient received scheduled  dialysis treatment yesterday, tolerated treatment well.  Next treatment scheduled for Monday if patient remains inpatient.  Patient encouraged to elevate left arm on pillows to alleviate positional edema.  Access positive for bruit and thrill  2. Anemia of chronic kidney disease Lab Results  Component Value Date   HGB 11.1 (L) 12/15/2021   Patient receives [place] outpatient.  3. Secondary Hyperparathyroidism: Lab Results  Component Value Date   CALCIUM 7.6 (L) 12/16/2021   CAION 1.05 (L) 11/08/2021   PHOS 6.3 (H) 12/15/2021    Calcium and phosphorus remain outside  desired target. Continue cholecalciferol.  May require binders, will continue assessment outpatient.  4.  Hypertension with chronic kidney disease.  Home regimen includes amlodipine, furosemide, losartan and metoprolol.  Currently receiving these medications.  Blood pressure 145/86, stable  5.  Bilateral pulmonary embolisms.    Transitioned to Eliquis.    LOS: 5 Velda Wendt 7/23/202311:37 AM

## 2021-12-17 DIAGNOSIS — Z13220 Encounter for screening for lipoid disorders: Secondary | ICD-10-CM | POA: Diagnosis not present

## 2021-12-17 DIAGNOSIS — Z992 Dependence on renal dialysis: Secondary | ICD-10-CM | POA: Diagnosis not present

## 2021-12-17 DIAGNOSIS — N2581 Secondary hyperparathyroidism of renal origin: Secondary | ICD-10-CM | POA: Diagnosis not present

## 2021-12-17 DIAGNOSIS — N186 End stage renal disease: Secondary | ICD-10-CM | POA: Diagnosis not present

## 2021-12-19 DIAGNOSIS — Z992 Dependence on renal dialysis: Secondary | ICD-10-CM | POA: Diagnosis not present

## 2021-12-19 DIAGNOSIS — N186 End stage renal disease: Secondary | ICD-10-CM | POA: Diagnosis not present

## 2021-12-19 DIAGNOSIS — N2581 Secondary hyperparathyroidism of renal origin: Secondary | ICD-10-CM | POA: Diagnosis not present

## 2021-12-20 ENCOUNTER — Other Ambulatory Visit: Payer: Self-pay

## 2021-12-20 NOTE — Patient Outreach (Signed)
[facility]) Care Management  12/20/2021  James Black [birth year] [id number]   Telephone Screen    Outreach call to patient to introduce [facility] services and assess care needs as part of benefit of PCP office and insurance plan. Noted in EMR patient has [facility] and Medicaid. Spoke with caregiver/niece-Tammy.   Main healthcare issue/concern today: Patient recently discharged from the hospital a few days ago and doing fairly well. He continues to have cough-taking med.    Health Maintenance/Care Gaps: MD follow up post discharge-not noted on d/c summary so caregiver did not know to make appt . Advised that patient should follow up with PCP within 2wks post discharge form hospital stetting.She will call and make an appt.       Plan: RN CM will close case.   Enzo [place], RN,BSN,CCM [place] [facility] Telephonic Care Management Coordinator Direct Phone: [phone] Toll Free: [phone] Fax: [phone]

## 2021-12-21 DIAGNOSIS — N186 End stage renal disease: Secondary | ICD-10-CM | POA: Diagnosis not present

## 2021-12-21 DIAGNOSIS — N2581 Secondary hyperparathyroidism of renal origin: Secondary | ICD-10-CM | POA: Diagnosis not present

## 2021-12-21 DIAGNOSIS — Z992 Dependence on renal dialysis: Secondary | ICD-10-CM | POA: Diagnosis not present

## 2021-12-21 NOTE — Discharge Summary (Signed)
Physician Discharge Summary   Patient: James Black MRN: [id number] DOB: [birth year]  Admit date:     12/11/2021  Discharge date: 12/16/2021  Discharge Physician: Annita Brod   PCP: Sofie Hartigan, MD   Recommendations at discharge:   New medication: Lipitor 25 mg p.o. 3 times daily New medication: Eliquis 5 mg p.o. twice daily x3 days and then start 10 mg p.o. twice daily x6 months New medication: Robitussin-DM 5 mL every 4 hours as needed for cough  Discharge Diagnoses: Principal Problem:   Bilateral pulmonary embolism ([place]) Active Problems:   Malignant hypertension   Hyponatremia   COVID-19 virus infection   End stage renal disease (HCC)   Elevated troponin   Anemia due to chronic kidney disease, on chronic dialysis ([place])   Gastro-esophageal reflux disease without esophagitis   Hypercholesterolemia   Overweight (BMI 25.0-29.9)  Resolved Problems:   * No resolved hospital problems. *  Hospital Course: 66 year old male with history of hyperlipidemia, hypertension, GERD, end-stage renal disease on hemodialysis, who presented to the emergency department on 7/18 for dyspnea on exertion.  Work-up revealed 2 small pulmonary emboli, as well as malignant hypertension with systolic blood pressures ranging from the 160s to 200.  BNP elevated at 1900.  COVID test positive.  Initial troponin at 64, however repeat troponins further elevated in the 100s and 200s.  Admitted to the hospitalist service and nephrology consulted for dialysis.  Critical care consulted on 7/19 for central line placement for lab draw.  Patient underwent dialysis on 7/19 and on 7/22.  Blood pressures have since improved.    Assessment and Plan: * Bilateral pulmonary embolism (HCC) Likely secondary to COVID-19 infection.  No evidence of heart strain.  Complete echocardiogram ordered.  Currently on heparin, changed over to Eliquis.  Hyponatremia Initially scheduled to get dialysis on 7/21, but due to  extenuating circumstances not done until 7/22.  Labs drawn prior to dialysis noted sodium 123.  This is likely hypervolemic dilutional.  The following day, sodium had started to trend back upward and was 126.  Expected to continue to trend upward.  Recheck labs this week during dialysis.  Malignant hypertension Secondary to volume overload from needing dialysis.  Continued on his Norvasc, ARB and beta-blocker.  As needed hydralazine.  Blood pressures better today ranging from the 140s to 150s.  COVID-19 virus infection Started on molnupiravir twice daily x5 days which he completed by day of discharge.  Airborne precautions.  Likely causing blood clot, but patient himself is not hypoxic.    End stage renal disease ([place]) Status post - 12/06/21: Insertion of arteriovenous Gore-Tex graft into the left brachial axillary for hemodialysis access Seen by nephrology with dialysis as per his regular schedule.  Dialysis today.  Elevated troponin Likely some demand ischemia secondary to his volume overload from dialysis as well as PE.  Complete echocardiogram pending.  On heparin drip.  Anemia due to chronic kidney disease, on chronic dialysis ([place]) Stable.  Hemoglobin at 12.2 on 7/19.  Hypercholesterolemia - Patient takes atorvastatin 10 mg daily  Gastro-esophageal reflux disease without esophagitis - PPI  Overweight (BMI 25.0-29.9) Meets criteria BMI greater than 25           Consultants: Nephrology Critical care   Procedures: Central line placement 7/19 for IV access for labs Hemodialysis 7/19, 7/22 Echocardiogram 7/19 noting grade 1 diastolic dysfunction and preserved ejection fraction  Disposition: Home Diet recommendation:  Renal diet DISCHARGE MEDICATION: Allergies as of 12/16/2021  Reactions   Cefuroxime Rash   TOLERATED CEFAZOLIN   Tramadol Nausea Only, Other (See Comments)   Dizziness        Medication List     STOP taking these medications     esomeprazole 40 MG capsule Commonly known as: [place] these medications    acetaminophen 325 MG tablet Commonly known as: TYLENOL Take 650 mg by mouth every 6 (six) hours as needed for mild pain.   amLODipine 5 MG tablet Commonly known as: NORVASC Take 5 mg by mouth every morning.   Apixaban Starter Pack ('10mg'$  and '5mg'$ ) Commonly known as: ELIQUIS STARTER PACK Take as directed on package: start with two-'5mg'$  tablets twice daily for 7 days. (Already been on since 7/20.)  On day 7/27, switch to one-'5mg'$  tablet twice daily.   atorvastatin 10 MG tablet Commonly known as: LIPITOR Take 10 mg by mouth every morning.   Cholecalciferol 125 MCG (5000 UT) Tabs Take 5,000 Units by mouth once a week.   dorzolamide-timolol 22.3-6.8 MG/ML ophthalmic solution Commonly known as: COSOPT Place 1 drop into both eyes 2 (two) times daily.   fluticasone 50 MCG/ACT nasal spray Commonly known as: FLONASE Place 2 sprays into both nostrils daily as needed for allergies or rhinitis.   furosemide 80 MG tablet Commonly known as: LASIX Take 80 mg by mouth daily.   guaiFENesin-dextromethorphan 100-10 MG/5ML syrup Commonly known as: ROBITUSSIN DM Take 5 mLs by mouth every 4 (four) hours as needed for cough.   hydrALAZINE 25 MG tablet Commonly known as: APRESOLINE Take 1 tablet (25 mg total) by mouth every 8 (eight) hours.   HYDROcodone-acetaminophen 5-325 MG tablet Commonly known as: NORCO/VICODIN Take 1-2 tablets by mouth every 6 (six) hours as needed for moderate pain.   lactulose 10 GM/15ML solution Commonly known as: CHRONULAC Take 20 g by mouth 2 (two) times daily.   losartan 50 MG tablet Commonly known as: COZAAR Take 50 mg by mouth at bedtime.   metoprolol tartrate 25 MG tablet Commonly known as: LOPRESSOR Take 25 mg by mouth every morning.   pantoprazole 20 MG tablet Commonly known as: PROTONIX Take 20 mg by mouth every morning.   Rocklatan 0.02-0.005 %  Soln Generic drug: Netarsudil-Latanoprost Place 1 drop into both eyes every evening.        Discharge Exam: Filed Weights   12/15/21 1432 12/15/21 1821 12/16/21 0427  Weight: 76.8 kg 75.1 kg 79.4 kg   General: Alert and oriented x3, no acute distress Cardiovascular: Regular rate and rhythm, S1-S2 Lungs: Clear to auscultation bilaterally  Condition at discharge: good  The results of significant diagnostics from this hospitalization (including imaging, microbiology, ancillary and laboratory) are listed below for reference.   Imaging Studies: ECHOCARDIOGRAM COMPLETE  Result Date: 12/12/2021    ECHOCARDIOGRAM REPORT   Patient Name:   James Black Date of Exam: 12/12/2021 Medical Rec #:  [id number]      Height:       66.0 in Accession #:    [id number]     Weight:       172.4 lb Date of Birth:  12-Nov-1955       BSA:          1.877 m Patient Age:    26 years       BP:           172/108 mmHg Patient Gender: M  HR:           96 bpm. Exam Location:  [facility] Procedure: 2D Echo, Cardiac Doppler and Color Doppler Indications:     Pulmonary Embolus I26.09  History:         Patient has no prior history of Echocardiogram examinations.                  Risk Factors:Hypertension.  Sonographer:     [name] Referring Phys:  [id number] AMY N COX Diagnosing Phys: Yolonda Kida MD  Sonographer Comments: Suboptimal apical window. IMPRESSIONS  1. Left ventricular ejection fraction, by estimation, is 50 to 55%. The left ventricle has low normal function. The left ventricle has no regional wall motion abnormalities. There is mild left ventricular hypertrophy. Left ventricular diastolic parameters are consistent with Grade I diastolic dysfunction (impaired relaxation).  2. Right ventricular systolic function is normal. The right ventricular size is normal.  3. The mitral valve is normal in structure. Trivial mitral valve regurgitation.  4. The aortic valve is normal in structure. Aortic valve regurgitation  is not visualized. FINDINGS  Left Ventricle: Left ventricular ejection fraction, by estimation, is 50 to 55%. The left ventricle has low normal function. The left ventricle has no regional wall motion abnormalities. The left ventricular internal cavity size was normal in size. There is mild left ventricular hypertrophy. Left ventricular diastolic parameters are consistent with Grade I diastolic dysfunction (impaired relaxation). Right Ventricle: The right ventricular size is normal. No increase in right ventricular wall thickness. Right ventricular systolic function is normal. Left Atrium: Left atrial size was normal in size. Right Atrium: Right atrial size was normal in size. Pericardium: There is no evidence of pericardial effusion. Mitral Valve: The mitral valve is normal in structure. Trivial mitral valve regurgitation. Tricuspid Valve: The tricuspid valve is normal in structure. Tricuspid valve regurgitation is mild. Aortic Valve: The aortic valve is normal in structure. Aortic valve regurgitation is not visualized. Aortic valve mean gradient measures 2.0 mmHg. Aortic valve peak gradient measures 4.2 mmHg. Aortic valve area, by VTI measures 3.71 cm. Pulmonic Valve: The pulmonic valve was normal in structure. Pulmonic valve regurgitation is not visualized. Aorta: The ascending aorta was not well visualized. IAS/Shunts: No atrial level shunt detected by color flow Doppler.  LEFT VENTRICLE PLAX 2D LVIDd:         4.70 cm   Diastology LVIDs:         3.40 cm   LV e' medial:    4.79 cm/s LV PW:         1.20 cm   LV E/e' medial:  12.3 LV IVS:        1.20 cm   LV e' lateral:   7.62 cm/s LVOT diam:     2.20 cm   LV E/e' lateral: 7.8 LV SV:         60 LV SV Index:   32 LVOT Area:     3.80 cm  RIGHT VENTRICLE RV Basal diam:  3.80 cm RV S prime:     11.70 cm/s TAPSE (M-mode): 2.1 cm LEFT ATRIUM             Index        RIGHT ATRIUM           Index LA diam:        3.80 cm 2.02 cm/m   RA Area:     18.80 cm LA Vol (A2C):    58.9 ml 31.37 ml/m  RA Volume:   55.90 ml  29.77 ml/m LA Vol (A4C):   53.3 ml 28.39 ml/m LA Biplane Vol: 57.4 ml 30.57 ml/m  AORTIC VALVE AV Area (Vmax):    2.90 cm AV Area (Vmean):   2.86 cm AV Area (VTI):     3.71 cm AV Vmax:           102.50 cm/s AV Vmean:          66.800 cm/s AV VTI:            0.161 m AV Peak Grad:      4.2 mmHg AV Mean Grad:      2.0 mmHg LVOT Vmax:         78.30 cm/s LVOT Vmean:        50.200 cm/s LVOT VTI:          0.157 m LVOT/AV VTI ratio: 0.98  AORTA Ao Root diam: 3.70 cm MITRAL VALVE               TRICUSPID VALVE MV Area (PHT): 6.37 cm    TR Peak grad:   18.8 mmHg MV Decel Time: 119 msec    TR Vmax:        217.00 cm/s MV E velocity: 59.10 cm/s MV A velocity: 69.00 cm/s  SHUNTS MV E/A ratio:  0.86        Systemic VTI:  0.16 m                            Systemic Diam: 2.20 cm Yolonda Kida MD Electronically signed by Yolonda Kida MD Signature Date/Time: 12/12/2021/12:52:15 PM    Final    DG Chest Port 1 View  Result Date: 12/12/2021 CLINICAL DATA:  Central line placement EXAM: PORTABLE CHEST 1 VIEW COMPARISON:  12/11/2021 FINDINGS: New left IJ approach central line tip overlies the central SVC. Right IJ approach dialysis catheter is partially imaged. The tip overlies the superior right atrium. No pneumothorax. Decreased pulmonary edema. No significant pleural effusion. Similar cardiomediastinal contours. IMPRESSION: New left IJ central line tip overlies SVC. No pneumothorax. Decreased pulmonary edema. Electronically Signed   By: Macy Mis M.D.   On: 12/12/2021 12:00   CT Angio Chest PE W and/or Wo Contrast  Result Date: 12/11/2021 CLINICAL DATA:  Elevated D-dimer level.  Dialysis patient. EXAM: CT ANGIOGRAPHY CHEST WITH CONTRAST TECHNIQUE: Multidetector CT imaging of the chest was performed using the standard protocol during bolus administration of intravenous contrast. Multiplanar CT image reconstructions and MIPs were obtained to evaluate the vascular  anatomy. RADIATION DOSE REDUCTION: This exam was performed according to the departmental dose-optimization program which includes automated exposure control, adjustment of the mA and/or kV according to patient size and/or use of iterative reconstruction technique. CONTRAST:  93m OMNIPAQUE IOHEXOL 350 MG/ML SOLN COMPARISON:  Chest radiograph 12/11/2021 FINDINGS: Cardiovascular: Acute pulmonary embolus in the anterior segmental branch of the left upper lobe pulmonary artery on image 121 series 5. Acute pulmonary embolus in the posterior basal segment right lower lobe pulmonary artery on image 239 series 5. No lobar or larger clot identified. Atherosclerotic calcifications in the descending thoracic aorta and left anterior descending coronary artery. Borderline cardiomegaly. The right IJ dialysis catheter tip is borderline low in position, at the junction of the right atrium and IVC. Mediastinum/Nodes: Unremarkable Lungs/Pleura: Small bilateral pleural effusions. There is some mild scattered bilateral ground-glass opacities. Secondary pulmonary lobular interstitial accentuation is present in both lungs.  Appearance suggests pulmonary edema. Upper Abdomen: Unremarkable Musculoskeletal: Thoracic spondylosis. Review of the MIP images confirms the above findings. IMPRESSION: 1. Two small segmental pulmonary emboli, one in the anterior segmental branch of the left upper lobe pulmonary artery, and the other in the posterior basal segment right lower lobe branch of the pulmonary artery. No lobar or greater pulmonary embolus identified. 2. Borderline cardiomegaly with secondary pulmonary lobular interstitial accentuation, small bilateral pleural effusions, and some patchy ground-glass opacities favoring acute pulmonary edema. 3. Aortic Atherosclerosis (ICD10-I70.0). Left anterior descending coronary artery atherosclerosis. 4. Borderline low position of the right IJ dialysis catheter, tip at the junction of the right atrium and  the IVC. Critical Value/emergent results were called by telephone at the time of interpretation on 12/11/2021 at 11:54 am to provider Dr. Conni Slipper , who verbally acknowledged these results. Electronically Signed   By: Van Clines M.D.   On: 12/11/2021 11:56   DG Chest 2 View  Result Date: 12/11/2021 CLINICAL DATA:  sob EXAM: CHEST - 2 VIEW COMPARISON:  None Available. FINDINGS: Diffuse interstitial opacities. Pulmonary vascular congestion. Mild enlargement the cardiac silhouette. Small pleural effusions. No visible pneumothorax. Right IJ large-bore central venous catheter with the tip projecting at the right atrium. IMPRESSION: Mild cardiomegaly, pulmonary vascular congestion, interstitial pulmonary edema, and small pleural effusions. Electronically Signed   By: Margaretha Sheffield M.D.   On: 12/11/2021 08:07    Microbiology: Results for orders placed or performed during the hospital encounter of 12/11/21  MRSA Next Gen by PCR, Nasal     Status: None   Collection Time: 12/11/21  5:44 PM   Specimen: Nasal Mucosa; Nasal Swab  Result Value Ref Range Status   MRSA by PCR Next Gen NOT DETECTED NOT DETECTED Final    Comment: (NOTE) The GeneXpert MRSA Assay (FDA approved for NASAL specimens only), is one component of a comprehensive MRSA colonization surveillance program. It is not intended to diagnose MRSA infection nor to guide or monitor treatment for MRSA infections. Test performance is not FDA approved in patients less than 53 years old. Performed at [facility], [address] [place], [place] [address]   SARS Coronavirus 2 by RT PCR (hospital order, performed in [facility] hospital lab) *cepheid single result test* Nasal Mucosa     Status: Abnormal   Collection Time: 12/11/21  5:44 PM   Specimen: Nasal Mucosa; Nasal Swab  Result Value Ref Range Status   SARS Coronavirus 2 by RT PCR POSITIVE (A) NEGATIVE Final    Comment: (NOTE) SARS-CoV-2 target nucleic acids are  DETECTED  SARS-CoV-2 RNA is generally detectable in upper respiratory specimens  during the acute phase of infection.  Positive results are indicative  of the presence of the identified virus, but do not rule out bacterial infection or co-infection with other pathogens not detected by the test.  Clinical correlation with patient history and  other diagnostic information is necessary to determine patient infection status.  The expected result is negative.  Fact Sheet for Patients:   https://www.patel.info/   Fact Sheet for Healthcare Providers:   https://hall.com/    This test is not yet approved or cleared by the [place] FDA and  has been authorized for detection and/or diagnosis of SARS-CoV-2 by FDA under an Emergency Use Authorization (EUA).  This EUA will remain in effect (meaning this test can be used) for the duration of  the COVID-19 declaration under Section 564(b)(1)  of the Act, 21 U.S.C. section 360-bbb-3(b)(1), unless the  authorization is terminated or revoked sooner.   Performed at [facility], [address] [place], [place] [address]     Labs: CBC: Recent Labs  Lab 12/15/21 0500  WBC 8.0  HGB 11.1*  HCT 34.7*  MCV 86.3  PLT 435   Basic Metabolic Panel: Recent Labs  Lab 12/15/21 0600 12/16/21 0520  NA 123* 126*  K 4.0 3.8  CL 91* 91*  CO2 19* 25  GLUCOSE 118* 92  BUN 57* 35*  CREATININE 11.68* 7.84*  CALCIUM 8.0* 7.6*  PHOS 6.3*  --    Liver Function Tests: Recent Labs  Lab 12/15/21 0600  ALBUMIN 3.1*   CBG: No results for input(s): "GLUCAP" in the last 168 hours.  Discharge time spent: less than 30 minutes.  Signed: Annita Brod, MD [facility] 12/21/2021

## 2021-12-24 DIAGNOSIS — Z992 Dependence on renal dialysis: Secondary | ICD-10-CM | POA: Diagnosis not present

## 2021-12-24 DIAGNOSIS — N2581 Secondary hyperparathyroidism of renal origin: Secondary | ICD-10-CM | POA: Diagnosis not present

## 2021-12-24 DIAGNOSIS — N186 End stage renal disease: Secondary | ICD-10-CM | POA: Diagnosis not present

## 2021-12-25 DIAGNOSIS — Z992 Dependence on renal dialysis: Secondary | ICD-10-CM | POA: Diagnosis not present

## 2021-12-25 DIAGNOSIS — N2581 Secondary hyperparathyroidism of renal origin: Secondary | ICD-10-CM | POA: Diagnosis not present

## 2021-12-25 DIAGNOSIS — N186 End stage renal disease: Secondary | ICD-10-CM | POA: Diagnosis not present

## 2021-12-26 DIAGNOSIS — N186 End stage renal disease: Secondary | ICD-10-CM | POA: Diagnosis not present

## 2021-12-26 DIAGNOSIS — N2581 Secondary hyperparathyroidism of renal origin: Secondary | ICD-10-CM | POA: Diagnosis not present

## 2021-12-26 DIAGNOSIS — Z992 Dependence on renal dialysis: Secondary | ICD-10-CM | POA: Diagnosis not present

## 2021-12-28 DIAGNOSIS — Z992 Dependence on renal dialysis: Secondary | ICD-10-CM | POA: Diagnosis not present

## 2021-12-28 DIAGNOSIS — N2581 Secondary hyperparathyroidism of renal origin: Secondary | ICD-10-CM | POA: Diagnosis not present

## 2021-12-28 DIAGNOSIS — N186 End stage renal disease: Secondary | ICD-10-CM | POA: Diagnosis not present

## 2021-12-31 DIAGNOSIS — N2581 Secondary hyperparathyroidism of renal origin: Secondary | ICD-10-CM | POA: Diagnosis not present

## 2021-12-31 DIAGNOSIS — N186 End stage renal disease: Secondary | ICD-10-CM | POA: Diagnosis not present

## 2021-12-31 DIAGNOSIS — Z992 Dependence on renal dialysis: Secondary | ICD-10-CM | POA: Diagnosis not present

## 2022-01-02 DIAGNOSIS — N186 End stage renal disease: Secondary | ICD-10-CM | POA: Diagnosis not present

## 2022-01-02 DIAGNOSIS — Z992 Dependence on renal dialysis: Secondary | ICD-10-CM | POA: Diagnosis not present

## 2022-01-02 DIAGNOSIS — N2581 Secondary hyperparathyroidism of renal origin: Secondary | ICD-10-CM | POA: Diagnosis not present

## 2022-01-04 DIAGNOSIS — N186 End stage renal disease: Secondary | ICD-10-CM | POA: Diagnosis not present

## 2022-01-04 DIAGNOSIS — N2581 Secondary hyperparathyroidism of renal origin: Secondary | ICD-10-CM | POA: Diagnosis not present

## 2022-01-04 DIAGNOSIS — Z992 Dependence on renal dialysis: Secondary | ICD-10-CM | POA: Diagnosis not present

## 2022-01-07 DIAGNOSIS — Z992 Dependence on renal dialysis: Secondary | ICD-10-CM | POA: Diagnosis not present

## 2022-01-07 DIAGNOSIS — N186 End stage renal disease: Secondary | ICD-10-CM | POA: Diagnosis not present

## 2022-01-07 DIAGNOSIS — N2581 Secondary hyperparathyroidism of renal origin: Secondary | ICD-10-CM | POA: Diagnosis not present

## 2022-01-09 DIAGNOSIS — Z992 Dependence on renal dialysis: Secondary | ICD-10-CM | POA: Diagnosis not present

## 2022-01-09 DIAGNOSIS — N2581 Secondary hyperparathyroidism of renal origin: Secondary | ICD-10-CM | POA: Diagnosis not present

## 2022-01-09 DIAGNOSIS — N186 End stage renal disease: Secondary | ICD-10-CM | POA: Diagnosis not present

## 2022-01-11 DIAGNOSIS — N2581 Secondary hyperparathyroidism of renal origin: Secondary | ICD-10-CM | POA: Diagnosis not present

## 2022-01-11 DIAGNOSIS — Z992 Dependence on renal dialysis: Secondary | ICD-10-CM | POA: Diagnosis not present

## 2022-01-11 DIAGNOSIS — N186 End stage renal disease: Secondary | ICD-10-CM | POA: Diagnosis not present

## 2022-01-14 DIAGNOSIS — N186 End stage renal disease: Secondary | ICD-10-CM | POA: Diagnosis not present

## 2022-01-14 DIAGNOSIS — Z992 Dependence on renal dialysis: Secondary | ICD-10-CM | POA: Diagnosis not present

## 2022-01-14 DIAGNOSIS — N2581 Secondary hyperparathyroidism of renal origin: Secondary | ICD-10-CM | POA: Diagnosis not present

## 2022-01-16 DIAGNOSIS — N186 End stage renal disease: Secondary | ICD-10-CM | POA: Diagnosis not present

## 2022-01-16 DIAGNOSIS — N2581 Secondary hyperparathyroidism of renal origin: Secondary | ICD-10-CM | POA: Diagnosis not present

## 2022-01-16 DIAGNOSIS — Z992 Dependence on renal dialysis: Secondary | ICD-10-CM | POA: Diagnosis not present

## 2022-01-17 DIAGNOSIS — N186 End stage renal disease: Secondary | ICD-10-CM | POA: Diagnosis not present

## 2022-01-17 DIAGNOSIS — K219 Gastro-esophageal reflux disease without esophagitis: Secondary | ICD-10-CM | POA: Diagnosis not present

## 2022-01-17 DIAGNOSIS — M1A30X0 Chronic gout due to renal impairment, unspecified site, without tophus (tophi): Secondary | ICD-10-CM | POA: Diagnosis not present

## 2022-01-17 DIAGNOSIS — G809 Cerebral palsy, unspecified: Secondary | ICD-10-CM | POA: Diagnosis not present

## 2022-01-17 DIAGNOSIS — E7800 Pure hypercholesterolemia, unspecified: Secondary | ICD-10-CM | POA: Diagnosis not present

## 2022-01-17 DIAGNOSIS — Z992 Dependence on renal dialysis: Secondary | ICD-10-CM | POA: Diagnosis not present

## 2022-01-17 DIAGNOSIS — I120 Hypertensive chronic kidney disease with stage 5 chronic kidney disease or end stage renal disease: Secondary | ICD-10-CM | POA: Diagnosis not present

## 2022-01-17 DIAGNOSIS — N2581 Secondary hyperparathyroidism of renal origin: Secondary | ICD-10-CM | POA: Diagnosis not present

## 2022-01-17 DIAGNOSIS — Z79899 Other long term (current) drug therapy: Secondary | ICD-10-CM | POA: Diagnosis not present

## 2022-01-18 DIAGNOSIS — N2581 Secondary hyperparathyroidism of renal origin: Secondary | ICD-10-CM | POA: Diagnosis not present

## 2022-01-18 DIAGNOSIS — N186 End stage renal disease: Secondary | ICD-10-CM | POA: Diagnosis not present

## 2022-01-18 DIAGNOSIS — Z992 Dependence on renal dialysis: Secondary | ICD-10-CM | POA: Diagnosis not present

## 2022-01-21 DIAGNOSIS — N186 End stage renal disease: Secondary | ICD-10-CM | POA: Diagnosis not present

## 2022-01-21 DIAGNOSIS — Z992 Dependence on renal dialysis: Secondary | ICD-10-CM | POA: Diagnosis not present

## 2022-01-21 DIAGNOSIS — N2581 Secondary hyperparathyroidism of renal origin: Secondary | ICD-10-CM | POA: Diagnosis not present

## 2022-01-23 DIAGNOSIS — N186 End stage renal disease: Secondary | ICD-10-CM | POA: Diagnosis not present

## 2022-01-23 DIAGNOSIS — N2581 Secondary hyperparathyroidism of renal origin: Secondary | ICD-10-CM | POA: Diagnosis not present

## 2022-01-23 DIAGNOSIS — Z992 Dependence on renal dialysis: Secondary | ICD-10-CM | POA: Diagnosis not present

## 2022-01-24 DIAGNOSIS — N186 End stage renal disease: Secondary | ICD-10-CM | POA: Diagnosis not present

## 2022-01-24 DIAGNOSIS — Z992 Dependence on renal dialysis: Secondary | ICD-10-CM | POA: Diagnosis not present

## 2022-01-25 DIAGNOSIS — Z992 Dependence on renal dialysis: Secondary | ICD-10-CM | POA: Diagnosis not present

## 2022-01-25 DIAGNOSIS — N2581 Secondary hyperparathyroidism of renal origin: Secondary | ICD-10-CM | POA: Diagnosis not present

## 2022-01-25 DIAGNOSIS — N186 End stage renal disease: Secondary | ICD-10-CM | POA: Diagnosis not present

## 2022-01-28 DIAGNOSIS — N2581 Secondary hyperparathyroidism of renal origin: Secondary | ICD-10-CM | POA: Diagnosis not present

## 2022-01-28 DIAGNOSIS — N186 End stage renal disease: Secondary | ICD-10-CM | POA: Diagnosis not present

## 2022-01-28 DIAGNOSIS — Z992 Dependence on renal dialysis: Secondary | ICD-10-CM | POA: Diagnosis not present

## 2022-01-30 ENCOUNTER — Telehealth (INDEPENDENT_AMBULATORY_CARE_PROVIDER_SITE_OTHER): Payer: Self-pay

## 2022-01-30 DIAGNOSIS — N2581 Secondary hyperparathyroidism of renal origin: Secondary | ICD-10-CM | POA: Diagnosis not present

## 2022-01-30 DIAGNOSIS — N186 End stage renal disease: Secondary | ICD-10-CM | POA: Diagnosis not present

## 2022-01-30 DIAGNOSIS — Z992 Dependence on renal dialysis: Secondary | ICD-10-CM | POA: Diagnosis not present

## 2022-01-30 NOTE — Telephone Encounter (Signed)
Spoke with the patient's niece and he is scheduled with Dr. Lucky Cowboy on 02/07/22 for a permcath removal with a  11:15 am arrival time to the [facility]. Pre-procedure instructions were discussed and will  be mailed.

## 2022-02-01 DIAGNOSIS — Z992 Dependence on renal dialysis: Secondary | ICD-10-CM | POA: Diagnosis not present

## 2022-02-01 DIAGNOSIS — N2581 Secondary hyperparathyroidism of renal origin: Secondary | ICD-10-CM | POA: Diagnosis not present

## 2022-02-01 DIAGNOSIS — N186 End stage renal disease: Secondary | ICD-10-CM | POA: Diagnosis not present

## 2022-02-04 DIAGNOSIS — Z992 Dependence on renal dialysis: Secondary | ICD-10-CM | POA: Diagnosis not present

## 2022-02-04 DIAGNOSIS — N2581 Secondary hyperparathyroidism of renal origin: Secondary | ICD-10-CM | POA: Diagnosis not present

## 2022-02-04 DIAGNOSIS — N186 End stage renal disease: Secondary | ICD-10-CM | POA: Diagnosis not present

## 2022-02-05 DIAGNOSIS — N2581 Secondary hyperparathyroidism of renal origin: Secondary | ICD-10-CM | POA: Diagnosis not present

## 2022-02-05 DIAGNOSIS — Z992 Dependence on renal dialysis: Secondary | ICD-10-CM | POA: Diagnosis not present

## 2022-02-05 DIAGNOSIS — N186 End stage renal disease: Secondary | ICD-10-CM | POA: Diagnosis not present

## 2022-02-06 DIAGNOSIS — N2581 Secondary hyperparathyroidism of renal origin: Secondary | ICD-10-CM | POA: Diagnosis not present

## 2022-02-06 DIAGNOSIS — Z992 Dependence on renal dialysis: Secondary | ICD-10-CM | POA: Diagnosis not present

## 2022-02-06 DIAGNOSIS — N186 End stage renal disease: Secondary | ICD-10-CM | POA: Diagnosis not present

## 2022-02-07 ENCOUNTER — Ambulatory Visit
Admission: RE | Admit: 2022-02-07 | Discharge: 2022-02-07 | Disposition: A | Discharge status: Home / Self Care | Payer: Medicare HMO | Attending: Vascular Surgery | Admitting: Vascular Surgery

## 2022-02-07 ENCOUNTER — Encounter
Admission: RE | Disposition: A | Discharge status: Home / Self Care | Payer: Self-pay | Source: Home / Self Care | Attending: Vascular Surgery

## 2022-02-07 ENCOUNTER — Encounter: Payer: Self-pay | Admitting: Vascular Surgery

## 2022-02-07 DIAGNOSIS — Z992 Dependence on renal dialysis: Secondary | ICD-10-CM | POA: Insufficient documentation

## 2022-02-07 DIAGNOSIS — I2510 Atherosclerotic heart disease of native coronary artery without angina pectoris: Secondary | ICD-10-CM | POA: Diagnosis not present

## 2022-02-07 DIAGNOSIS — N186 End stage renal disease: Secondary | ICD-10-CM | POA: Insufficient documentation

## 2022-02-07 DIAGNOSIS — T8249XA Other complication of vascular dialysis catheter, initial encounter: Secondary | ICD-10-CM | POA: Insufficient documentation

## 2022-02-07 DIAGNOSIS — Y841 Kidney dialysis as the cause of abnormal reaction of the patient, or of later complication, without mention of misadventure at the time of the procedure: Secondary | ICD-10-CM | POA: Diagnosis not present

## 2022-02-07 DIAGNOSIS — I120 Hypertensive chronic kidney disease with stage 5 chronic kidney disease or end stage renal disease: Secondary | ICD-10-CM | POA: Insufficient documentation

## 2022-02-07 DIAGNOSIS — E1122 Type 2 diabetes mellitus with diabetic chronic kidney disease: Secondary | ICD-10-CM | POA: Insufficient documentation

## 2022-02-07 DIAGNOSIS — Z95828 Presence of other vascular implants and grafts: Secondary | ICD-10-CM | POA: Diagnosis not present

## 2022-02-07 HISTORY — PX: DIALYSIS/PERMA CATHETER REMOVAL: CATH118289

## 2022-02-07 SURGERY — DIALYSIS/PERMA CATHETER REMOVAL
Anesthesia: LOCAL

## 2022-02-07 SURGICAL SUPPLY — 2 items
FORCEPS HALSTEAD CVD 5IN STRL (INSTRUMENTS) IMPLANT
TRAY LACERAT/PLASTIC (MISCELLANEOUS) IMPLANT

## 2022-02-07 NOTE — Op Note (Signed)
Operative Note     Preoperative diagnosis:   1. ESRD with functional permanent access  Postoperative diagnosis:  1. ESRD with functional permanent access  Procedure:  Removal of right jugular Permcath  Surgeon:  [name], MD  Anesthesia:  Local  EBL:  Minimal  Indication for the Procedure:  The patient has a functional permanent dialysis access and no longer needs their permcath.  This can be removed.  Risks and benefits are discussed and informed consent is obtained.  Description of the Procedure:  The patient's right neck, chest and existing catheter were sterilely prepped and draped. The area around the catheter was anesthetized copiously with 1% lidocaine. The catheter was dissected out with curved hemostats until the cuff was freed from the surrounding fibrous sheath. The fiber sheath was transected, and the catheter was then removed in its entirety using gentle traction. Pressure was held and sterile dressings were placed. The patient tolerated the procedure well and was taken to the recovery room in stable condition.     [name]  02/07/2022, 11:53 AM This note was created with Dragon Medical transcription system. Any errors in dictation are purely unintentional.

## 2022-02-08 DIAGNOSIS — Z992 Dependence on renal dialysis: Secondary | ICD-10-CM | POA: Diagnosis not present

## 2022-02-08 DIAGNOSIS — N2581 Secondary hyperparathyroidism of renal origin: Secondary | ICD-10-CM | POA: Diagnosis not present

## 2022-02-08 DIAGNOSIS — N186 End stage renal disease: Secondary | ICD-10-CM | POA: Diagnosis not present

## 2022-02-11 DIAGNOSIS — Z992 Dependence on renal dialysis: Secondary | ICD-10-CM | POA: Diagnosis not present

## 2022-02-11 DIAGNOSIS — N2581 Secondary hyperparathyroidism of renal origin: Secondary | ICD-10-CM | POA: Diagnosis not present

## 2022-02-11 DIAGNOSIS — N186 End stage renal disease: Secondary | ICD-10-CM | POA: Diagnosis not present

## 2022-02-13 DIAGNOSIS — N2581 Secondary hyperparathyroidism of renal origin: Secondary | ICD-10-CM | POA: Diagnosis not present

## 2022-02-13 DIAGNOSIS — Z992 Dependence on renal dialysis: Secondary | ICD-10-CM | POA: Diagnosis not present

## 2022-02-13 DIAGNOSIS — N186 End stage renal disease: Secondary | ICD-10-CM | POA: Diagnosis not present

## 2022-02-13 NOTE — H&P (Signed)
James Black Admission History & Physical  MRN : [id number]  James Black is a 66 y.o. ([birth year]) male who presents with chief complaint of No chief complaint on file. Marland Kitchen  History of Present Illness: I am asked to evaluate the patient by the dialysis center. The patient was sent here because they have a nonfunctioning tunneled catheter and a functioning arm access.  The patient reports they're not been any problems with any of their dialysis runs. They are reporting good flows with good parameters at dialysis.   Patient denies pain or tenderness overlying the access.  There is no pain with dialysis.  The patient denies hand pain or finger pain consistent with steal syndrome.  No fevers or chills while on dialysis.    No current facility-administered medications for this encounter.   Current Outpatient Medications  Medication Sig Dispense Refill   acetaminophen (TYLENOL) 325 MG tablet Take 650 mg by mouth every 6 (six) hours as needed for mild pain.     amLODipine (NORVASC) 5 MG tablet Take 5 mg by mouth every morning.     APIXABAN (ELIQUIS) VTE STARTER PACK ('10MG'$  AND '5MG'$ ) Take as directed on package: start with two-'5mg'$  tablets twice daily for 7 days. (Already been on since 7/20.)  On day 7/27, switch to one-'5mg'$  tablet twice daily. 1 each 0   atorvastatin (LIPITOR) 10 MG tablet Take 10 mg by mouth every morning.     Cholecalciferol 5000 units TABS Take 5,000 Units by mouth once a week.     dorzolamide-timolol (COSOPT) 22.3-6.8 MG/ML ophthalmic solution Place 1 drop into both eyes 2 (two) times daily.     fluticasone (FLONASE) 50 MCG/ACT nasal spray Place 2 sprays into both nostrils daily as needed for allergies or rhinitis.     furosemide (LASIX) 80 MG tablet Take 80 mg by mouth daily.     guaiFENesin-dextromethorphan (ROBITUSSIN DM) 100-10 MG/5ML syrup Take 5 mLs by mouth every 4 (four) hours as needed for cough. 118 mL 0   hydrALAZINE (APRESOLINE) 25 MG tablet  Take 1 tablet (25 mg total) by mouth every 8 (eight) hours. 90 tablet 2   HYDROcodone-acetaminophen (NORCO/VICODIN) 5-325 MG tablet Take 1-2 tablets by mouth every 6 (six) hours as needed for moderate pain. 12 tablet 0   lactulose (CHRONULAC) 10 GM/15ML solution Take 20 g by mouth 2 (two) times daily.     losartan (COZAAR) 50 MG tablet Take 50 mg by mouth at bedtime.     metoprolol tartrate (LOPRESSOR) 25 MG tablet Take 25 mg by mouth every morning.     Netarsudil-Latanoprost (ROCKLATAN) 0.02-0.005 % SOLN Place 1 drop into both eyes every evening.     pantoprazole (PROTONIX) 20 MG tablet Take 20 mg by mouth every morning.      Past Medical History:  Diagnosis Date   Anemia    Arthritis    Cerebral palsy (HCC)    DDD (degenerative disc disease), lumbar    Deficiency of vitamin B12    Encephalitis    ESRD (end stage renal disease) on dialysis (HCC)    M-W-F   GERD (gastroesophageal reflux disease)    Hemorrhoid    Hypercholesterolemia    Hyperkalemia    Hypertension    PONV (postoperative nausea and vomiting)    n/v after 08-16-21 capd revision   Tremor    Tubular adenoma of colon    Vitamin D deficiency     Past Surgical History:  Procedure Laterality Date  AV FISTULA PLACEMENT Left 12/06/2021   Procedure: INSERTION OF ARTERIOVENOUS (AV) GORE-TEX GRAFT ARM (BRACHIAL AXILLARY);  Surgeon: Algernon Huxley, MD;  Location: [facility];  Service: Vascular;  Laterality: Left;   CAPD INSERTION N/A 08/10/2020   Procedure: LAPAROSCOPIC INSERTION CONTINUOUS AMBULATORY PERITONEAL DIALYSIS  (CAPD) CATHETER;  Surgeon: Jules Husbands, MD;  Location: [facility];  Service: General;  Laterality: N/A;   CAPD REMOVAL N/A 11/08/2021   Procedure: REMOVAL CONTINUOUS AMBULATORY PERITONEAL DIALYSIS  (CAPD) CATHETER;  Surgeon: Jules Husbands, MD;  Location: [facility];  Service: General;  Laterality: N/A;   CAPD REVISION N/A 08/16/2021   Procedure: LAPAROSCOPIC REVISION CONTINUOUS AMBULATORY PERITONEAL DIALYSIS   (CAPD) CATHETER;  Surgeon: Jules Husbands, MD;  Location: [facility];  Service: General;  Laterality: N/A;   COLONOSCOPY     2003, 2011   COLONOSCOPY WITH PROPOFOL N/A 05/25/2015   Procedure: COLONOSCOPY WITH PROPOFOL;  Surgeon: Manya Silvas, MD;  Location: [facility] ENDOSCOPY;  Service: Endoscopy;  Laterality: N/A;   COLONOSCOPY WITH PROPOFOL N/A 05/12/2020   Procedure: COLONOSCOPY WITH PROPOFOL;  Surgeon: Lesly Rubenstein, MD;  Location: [facility] ENDOSCOPY;  Service: Endoscopy;  Laterality: N/A;   DIALYSIS/PERMA CATHETER REMOVAL N/A 02/07/2022   Procedure: DIALYSIS/PERMA CATHETER REMOVAL;  Surgeon: Algernon Huxley, MD;  Location: [place] [facility];  Service: Cardiovascular;  Laterality: N/A;   ESOPHAGOGASTRODUODENOSCOPY     2003, 2011   ESOPHAGOGASTRODUODENOSCOPY (EGD) WITH PROPOFOL N/A 05/12/2020   Procedure: ESOPHAGOGASTRODUODENOSCOPY (EGD) WITH PROPOFOL;  Surgeon: Lesly Rubenstein, MD;  Location: [facility] ENDOS[facility];  Service: Endoscopy;  Laterality: N/A;   FRACTURE SURGERY Left 1997   arm   XI ROBOTIC ASSISTED INGUINAL HERNIA REPAIR WITH MESH Right 08/23/2021   Procedure: XI ROBOTIC ASSISTED INGUINAL HERNIA REPAIR WITH MESH;  Surgeon: Jules Husbands, MD;  Location: [facility];  Service: General;  Laterality: Right;    Social History Social History   Tobacco Use   Smoking status: Never    Passive exposure: Never   Smokeless tobacco: Never  Vaping Use   Vaping Use: Never used  Substance Use Topics   Alcohol use: No   Drug use: No    Family History No family history on file.  No family history of bleeding or clotting disorders, autoimmune disease or porphyria  Allergies  Allergen Reactions   Cefuroxime Rash    TOLERATED CEFAZOLIN   Tramadol Nausea Only and Other (See Comments)    Dizziness     REVIEW OF SYSTEMS (Negative unless checked)  Constitutional: '[]'$ Weight loss  '[]'$ Fever  '[]'$ Chills Cardiac: '[]'$ Chest pain   '[]'$ Chest pressure   '[]'$ Palpitations   '[]'$ Shortness of breath when  laying flat   '[]'$ Shortness of breath at rest   '[x]'$ Shortness of breath with exertion. Vascular:  '[]'$ Pain in legs with walking   '[]'$ Pain in legs at rest   '[]'$ Pain in legs when laying flat   '[]'$ Claudication   '[]'$ Pain in feet when walking  '[]'$ Pain in feet at rest  '[]'$ Pain in feet when laying flat   '[]'$ History of DVT   '[]'$ Phlebitis   '[]'$ Swelling in legs   '[]'$ Varicose veins   '[]'$ Non-healing ulcers Pulmonary:   '[]'$ Uses home oxygen   '[]'$ Productive cough   '[]'$ Hemoptysis   '[]'$ Wheeze  '[]'$ COPD   '[]'$ Asthma Neurologic:  '[]'$ Dizziness  '[]'$ Blackouts   '[]'$ Seizures   '[]'$ History of stroke   '[]'$ History of TIA  '[]'$ Aphasia   '[]'$ Temporary blindness   '[]'$ Dysphagia   '[]'$ Weakness or numbness in arms   '[]'$ Weakness or numbness in legs  Musculoskeletal:  '[]'$ Arthritis   '[]'$ Joint swelling   '[]'$ Joint pain   '[]'$ Low back pain Hematologic:  '[]'$ Easy bruising  '[]'$ Easy bleeding   '[]'$ Hypercoagulable state   '[]'$ Anemic  '[]'$ Hepatitis Gastrointestinal:  '[]'$ Blood in stool   '[]'$ Vomiting blood  '[]'$ Gastroesophageal reflux/heartburn   '[]'$ Difficulty swallowing. Genitourinary:  '[x]'$ Chronic kidney disease   '[]'$ Difficult urination  '[]'$ Frequent urination  '[]'$ Burning with urination   '[]'$ Blood in urine Skin:  '[]'$ Rashes   '[]'$ Ulcers   '[]'$ Wounds Psychological:  '[]'$ History of anxiety   '[]'$  History of major depression.  Physical Examination  Vitals:   02/07/22 1104 02/07/22 1110 02/07/22 1125  BP: (!) 154/90 (!) 164/95 (!) 149/82  Pulse: 84 85 84  Temp: 98.4 F (36.9 C)    TempSrc: Oral    SpO2: 95% 96% 95%   There is no height or weight on file to calculate BMI. Gen: WD/WN, NAD Head: [place]/AT, No temporalis wasting. Prominent temp pulse not noted. Ear/Nose/Throat: Hearing grossly intact, nares w/o erythema or drainage, oropharynx w/o Erythema/Exudate,  Eyes: Conjunctiva clear, sclera non-icteric Neck: Trachea midline.  No JVD.  Pulmonary:  Good air movement, respirations not labored, no use of accessory muscles.  Cardiac: RRR, normal S1, S2. Vascular: permcath right chest Vessel Right Left  Radial  Palpable Palpable  Ulnar Not Palpable Not Palpable  Brachial Palpable Palpable  Carotid Palpable, without bruit Palpable, without bruit  Gastrointestinal: soft, non-tender/non-distended. No guarding/reflex.  Musculoskeletal: M/S 5/5 throughout.  Extremities without ischemic changes.  No deformity or atrophy.  Neurologic: Sensation grossly intact in extremities.  Symmetrical.  Speech is fluent. Motor exam as listed above. Psychiatric: Judgment intact, Mood & affect appropriate for pt's clinical situation. Dermatologic: No rashes or ulcers noted.  No cellulitis or open wounds. Lymph : No Cervical, Axillary, or Inguinal lymphadenopathy.   CBC Lab Results  Component Value Date   WBC 8.0 12/15/2021   HGB 11.1 (L) 12/15/2021   HCT 34.7 (L) 12/15/2021   MCV 86.3 12/15/2021   PLT 220 12/15/2021    BMET    Component Value Date/Time   NA 126 (L) 12/16/2021 0520   K 3.8 12/16/2021 0520   CL 91 (L) 12/16/2021 0520   CO2 25 12/16/2021 0520   GLUCOSE 92 12/16/2021 0520   BUN 35 (H) 12/16/2021 0520   CREATININE 7.84 (H) 12/16/2021 0520   CALCIUM 7.6 (L) 12/16/2021 0520   GFRNONAA 7 (L) 12/16/2021 0520   CrCl cannot be calculated (Patient's most recent lab result is older than the maximum 21 days allowed.).  COAG Lab Results  Component Value Date   INR 1.1 12/11/2021   INR 1.1 12/06/2021    Radiology PERIPHERAL VASCULAR CATHETERIZATION  Result Date: 02/07/2022 See surgical note for result.   Assessment/Plan 1.  Complication dialysis device  Patient's Tunneled catheter is not being used. The patient has an extremity access that is functioning well. Therefore, the patient will undergo removal of the tunneled catheter under local anesthesia.  The risks and benefits were described to the patient.  All questions were answered.  The patient agrees to proceed with angiography and intervention. Potassium will be drawn to ensure that it is an appropriate level prior to performing  intervention. 2.  End-stage renal disease requiring hemodialysis:  Patient will continue dialysis therapy without further interruption if a successful intervention is not achieved then a tunneled catheter will be placed. Dialysis has already been arranged. 3.  Hypertension:  Patient will continue medical management; nephrology is following no changes in oral medications. 4. Diabetes mellitus:  Glucose will be monitored and oral medications been held this morning once the patient has undergone the patient's procedure po intake will be reinitiated and again Accu-Cheks will be used to assess the blood glucose level and treat as needed. The patient will be restarted on the patient's usual hypoglycemic regime 5.  Coronary artery disease:  EKG will be monitored. Nitrates will be used if needed. The patient's oral cardiac medications will be continued.    [name], MD  02/13/2022 4:21 PM

## 2022-02-15 DIAGNOSIS — N2581 Secondary hyperparathyroidism of renal origin: Secondary | ICD-10-CM | POA: Diagnosis not present

## 2022-02-15 DIAGNOSIS — Z992 Dependence on renal dialysis: Secondary | ICD-10-CM | POA: Diagnosis not present

## 2022-02-15 DIAGNOSIS — N186 End stage renal disease: Secondary | ICD-10-CM | POA: Diagnosis not present

## 2022-02-18 DIAGNOSIS — Z79899 Other long term (current) drug therapy: Secondary | ICD-10-CM | POA: Diagnosis not present

## 2022-02-18 DIAGNOSIS — Z992 Dependence on renal dialysis: Secondary | ICD-10-CM | POA: Diagnosis not present

## 2022-02-18 DIAGNOSIS — N2581 Secondary hyperparathyroidism of renal origin: Secondary | ICD-10-CM | POA: Diagnosis not present

## 2022-02-18 DIAGNOSIS — N186 End stage renal disease: Secondary | ICD-10-CM | POA: Diagnosis not present

## 2022-02-19 DIAGNOSIS — H401133 Primary open-angle glaucoma, bilateral, severe stage: Secondary | ICD-10-CM | POA: Diagnosis not present

## 2022-02-20 DIAGNOSIS — N2581 Secondary hyperparathyroidism of renal origin: Secondary | ICD-10-CM | POA: Diagnosis not present

## 2022-02-20 DIAGNOSIS — Z992 Dependence on renal dialysis: Secondary | ICD-10-CM | POA: Diagnosis not present

## 2022-02-20 DIAGNOSIS — N186 End stage renal disease: Secondary | ICD-10-CM | POA: Diagnosis not present

## 2022-02-22 DIAGNOSIS — N186 End stage renal disease: Secondary | ICD-10-CM | POA: Diagnosis not present

## 2022-02-22 DIAGNOSIS — Z992 Dependence on renal dialysis: Secondary | ICD-10-CM | POA: Diagnosis not present

## 2022-02-22 DIAGNOSIS — N2581 Secondary hyperparathyroidism of renal origin: Secondary | ICD-10-CM | POA: Diagnosis not present

## 2022-02-23 DIAGNOSIS — N186 End stage renal disease: Secondary | ICD-10-CM | POA: Diagnosis not present

## 2022-02-23 DIAGNOSIS — Z992 Dependence on renal dialysis: Secondary | ICD-10-CM | POA: Diagnosis not present

## 2022-02-24 DIAGNOSIS — N186 End stage renal disease: Secondary | ICD-10-CM | POA: Diagnosis not present

## 2022-02-24 DIAGNOSIS — N2581 Secondary hyperparathyroidism of renal origin: Secondary | ICD-10-CM | POA: Diagnosis not present

## 2022-02-24 DIAGNOSIS — Z992 Dependence on renal dialysis: Secondary | ICD-10-CM | POA: Diagnosis not present

## 2022-02-25 DIAGNOSIS — N2581 Secondary hyperparathyroidism of renal origin: Secondary | ICD-10-CM | POA: Diagnosis not present

## 2022-02-25 DIAGNOSIS — N186 End stage renal disease: Secondary | ICD-10-CM | POA: Diagnosis not present

## 2022-02-25 DIAGNOSIS — Z992 Dependence on renal dialysis: Secondary | ICD-10-CM | POA: Diagnosis not present

## 2022-02-27 DIAGNOSIS — N2581 Secondary hyperparathyroidism of renal origin: Secondary | ICD-10-CM | POA: Diagnosis not present

## 2022-02-27 DIAGNOSIS — N186 End stage renal disease: Secondary | ICD-10-CM | POA: Diagnosis not present

## 2022-02-27 DIAGNOSIS — Z992 Dependence on renal dialysis: Secondary | ICD-10-CM | POA: Diagnosis not present

## 2022-03-01 DIAGNOSIS — N2581 Secondary hyperparathyroidism of renal origin: Secondary | ICD-10-CM | POA: Diagnosis not present

## 2022-03-01 DIAGNOSIS — N186 End stage renal disease: Secondary | ICD-10-CM | POA: Diagnosis not present

## 2022-03-01 DIAGNOSIS — Z992 Dependence on renal dialysis: Secondary | ICD-10-CM | POA: Diagnosis not present

## 2022-03-04 DIAGNOSIS — N186 End stage renal disease: Secondary | ICD-10-CM | POA: Diagnosis not present

## 2022-03-04 DIAGNOSIS — Z992 Dependence on renal dialysis: Secondary | ICD-10-CM | POA: Diagnosis not present

## 2022-03-04 DIAGNOSIS — N2581 Secondary hyperparathyroidism of renal origin: Secondary | ICD-10-CM | POA: Diagnosis not present

## 2022-03-06 DIAGNOSIS — Z992 Dependence on renal dialysis: Secondary | ICD-10-CM | POA: Diagnosis not present

## 2022-03-06 DIAGNOSIS — N2581 Secondary hyperparathyroidism of renal origin: Secondary | ICD-10-CM | POA: Diagnosis not present

## 2022-03-06 DIAGNOSIS — N186 End stage renal disease: Secondary | ICD-10-CM | POA: Diagnosis not present

## 2022-03-08 DIAGNOSIS — N186 End stage renal disease: Secondary | ICD-10-CM | POA: Diagnosis not present

## 2022-03-08 DIAGNOSIS — N2581 Secondary hyperparathyroidism of renal origin: Secondary | ICD-10-CM | POA: Diagnosis not present

## 2022-03-08 DIAGNOSIS — Z992 Dependence on renal dialysis: Secondary | ICD-10-CM | POA: Diagnosis not present

## 2022-03-11 DIAGNOSIS — N186 End stage renal disease: Secondary | ICD-10-CM | POA: Diagnosis not present

## 2022-03-11 DIAGNOSIS — Z992 Dependence on renal dialysis: Secondary | ICD-10-CM | POA: Diagnosis not present

## 2022-03-11 DIAGNOSIS — N2581 Secondary hyperparathyroidism of renal origin: Secondary | ICD-10-CM | POA: Diagnosis not present

## 2022-03-13 DIAGNOSIS — N2581 Secondary hyperparathyroidism of renal origin: Secondary | ICD-10-CM | POA: Diagnosis not present

## 2022-03-13 DIAGNOSIS — Z992 Dependence on renal dialysis: Secondary | ICD-10-CM | POA: Diagnosis not present

## 2022-03-13 DIAGNOSIS — N186 End stage renal disease: Secondary | ICD-10-CM | POA: Diagnosis not present

## 2022-03-15 DIAGNOSIS — N2581 Secondary hyperparathyroidism of renal origin: Secondary | ICD-10-CM | POA: Diagnosis not present

## 2022-03-15 DIAGNOSIS — Z992 Dependence on renal dialysis: Secondary | ICD-10-CM | POA: Diagnosis not present

## 2022-03-15 DIAGNOSIS — N186 End stage renal disease: Secondary | ICD-10-CM | POA: Diagnosis not present

## 2022-03-18 DIAGNOSIS — Z992 Dependence on renal dialysis: Secondary | ICD-10-CM | POA: Diagnosis not present

## 2022-03-18 DIAGNOSIS — Z79899 Other long term (current) drug therapy: Secondary | ICD-10-CM | POA: Diagnosis not present

## 2022-03-18 DIAGNOSIS — N2581 Secondary hyperparathyroidism of renal origin: Secondary | ICD-10-CM | POA: Diagnosis not present

## 2022-03-18 DIAGNOSIS — N186 End stage renal disease: Secondary | ICD-10-CM | POA: Diagnosis not present

## 2022-03-18 DIAGNOSIS — Z131 Encounter for screening for diabetes mellitus: Secondary | ICD-10-CM | POA: Diagnosis not present

## 2022-03-20 DIAGNOSIS — N2581 Secondary hyperparathyroidism of renal origin: Secondary | ICD-10-CM | POA: Diagnosis not present

## 2022-03-20 DIAGNOSIS — Z992 Dependence on renal dialysis: Secondary | ICD-10-CM | POA: Diagnosis not present

## 2022-03-20 DIAGNOSIS — N186 End stage renal disease: Secondary | ICD-10-CM | POA: Diagnosis not present

## 2022-03-22 DIAGNOSIS — N186 End stage renal disease: Secondary | ICD-10-CM | POA: Diagnosis not present

## 2022-03-22 DIAGNOSIS — Z992 Dependence on renal dialysis: Secondary | ICD-10-CM | POA: Diagnosis not present

## 2022-03-22 DIAGNOSIS — N2581 Secondary hyperparathyroidism of renal origin: Secondary | ICD-10-CM | POA: Diagnosis not present

## 2022-03-25 DIAGNOSIS — N186 End stage renal disease: Secondary | ICD-10-CM | POA: Diagnosis not present

## 2022-03-25 DIAGNOSIS — Z992 Dependence on renal dialysis: Secondary | ICD-10-CM | POA: Diagnosis not present

## 2022-03-25 DIAGNOSIS — N2581 Secondary hyperparathyroidism of renal origin: Secondary | ICD-10-CM | POA: Diagnosis not present

## 2022-03-26 DIAGNOSIS — Z992 Dependence on renal dialysis: Secondary | ICD-10-CM | POA: Diagnosis not present

## 2022-03-26 DIAGNOSIS — N186 End stage renal disease: Secondary | ICD-10-CM | POA: Diagnosis not present

## 2022-03-27 DIAGNOSIS — N2581 Secondary hyperparathyroidism of renal origin: Secondary | ICD-10-CM | POA: Diagnosis not present

## 2022-03-27 DIAGNOSIS — Z992 Dependence on renal dialysis: Secondary | ICD-10-CM | POA: Diagnosis not present

## 2022-03-27 DIAGNOSIS — N186 End stage renal disease: Secondary | ICD-10-CM | POA: Diagnosis not present

## 2022-03-29 DIAGNOSIS — Z992 Dependence on renal dialysis: Secondary | ICD-10-CM | POA: Diagnosis not present

## 2022-03-29 DIAGNOSIS — N186 End stage renal disease: Secondary | ICD-10-CM | POA: Diagnosis not present

## 2022-03-29 DIAGNOSIS — N2581 Secondary hyperparathyroidism of renal origin: Secondary | ICD-10-CM | POA: Diagnosis not present

## 2022-04-01 DIAGNOSIS — Z992 Dependence on renal dialysis: Secondary | ICD-10-CM | POA: Diagnosis not present

## 2022-04-01 DIAGNOSIS — N186 End stage renal disease: Secondary | ICD-10-CM | POA: Diagnosis not present

## 2022-04-01 DIAGNOSIS — N2581 Secondary hyperparathyroidism of renal origin: Secondary | ICD-10-CM | POA: Diagnosis not present

## 2022-04-03 DIAGNOSIS — N2581 Secondary hyperparathyroidism of renal origin: Secondary | ICD-10-CM | POA: Diagnosis not present

## 2022-04-03 DIAGNOSIS — N186 End stage renal disease: Secondary | ICD-10-CM | POA: Diagnosis not present

## 2022-04-03 DIAGNOSIS — Z992 Dependence on renal dialysis: Secondary | ICD-10-CM | POA: Diagnosis not present

## 2022-04-04 ENCOUNTER — Encounter (INDEPENDENT_AMBULATORY_CARE_PROVIDER_SITE_OTHER): Payer: Medicare HMO

## 2022-04-04 ENCOUNTER — Ambulatory Visit (INDEPENDENT_AMBULATORY_CARE_PROVIDER_SITE_OTHER): Payer: Medicare HMO | Admitting: Nurse Practitioner

## 2022-04-05 DIAGNOSIS — N2581 Secondary hyperparathyroidism of renal origin: Secondary | ICD-10-CM | POA: Diagnosis not present

## 2022-04-05 DIAGNOSIS — N186 End stage renal disease: Secondary | ICD-10-CM | POA: Diagnosis not present

## 2022-04-05 DIAGNOSIS — Z992 Dependence on renal dialysis: Secondary | ICD-10-CM | POA: Diagnosis not present

## 2022-04-08 DIAGNOSIS — Z992 Dependence on renal dialysis: Secondary | ICD-10-CM | POA: Diagnosis not present

## 2022-04-08 DIAGNOSIS — N2581 Secondary hyperparathyroidism of renal origin: Secondary | ICD-10-CM | POA: Diagnosis not present

## 2022-04-08 DIAGNOSIS — N186 End stage renal disease: Secondary | ICD-10-CM | POA: Diagnosis not present

## 2022-04-10 DIAGNOSIS — N186 End stage renal disease: Secondary | ICD-10-CM | POA: Diagnosis not present

## 2022-04-10 DIAGNOSIS — N2581 Secondary hyperparathyroidism of renal origin: Secondary | ICD-10-CM | POA: Diagnosis not present

## 2022-04-10 DIAGNOSIS — Z992 Dependence on renal dialysis: Secondary | ICD-10-CM | POA: Diagnosis not present

## 2022-04-12 DIAGNOSIS — N2581 Secondary hyperparathyroidism of renal origin: Secondary | ICD-10-CM | POA: Diagnosis not present

## 2022-04-12 DIAGNOSIS — N186 End stage renal disease: Secondary | ICD-10-CM | POA: Diagnosis not present

## 2022-04-12 DIAGNOSIS — Z992 Dependence on renal dialysis: Secondary | ICD-10-CM | POA: Diagnosis not present

## 2022-04-15 DIAGNOSIS — N2581 Secondary hyperparathyroidism of renal origin: Secondary | ICD-10-CM | POA: Diagnosis not present

## 2022-04-15 DIAGNOSIS — Z992 Dependence on renal dialysis: Secondary | ICD-10-CM | POA: Diagnosis not present

## 2022-04-15 DIAGNOSIS — N186 End stage renal disease: Secondary | ICD-10-CM | POA: Diagnosis not present

## 2022-04-17 DIAGNOSIS — N2581 Secondary hyperparathyroidism of renal origin: Secondary | ICD-10-CM | POA: Diagnosis not present

## 2022-04-17 DIAGNOSIS — N186 End stage renal disease: Secondary | ICD-10-CM | POA: Diagnosis not present

## 2022-04-17 DIAGNOSIS — Z992 Dependence on renal dialysis: Secondary | ICD-10-CM | POA: Diagnosis not present

## 2022-04-19 DIAGNOSIS — Z992 Dependence on renal dialysis: Secondary | ICD-10-CM | POA: Diagnosis not present

## 2022-04-19 DIAGNOSIS — N2581 Secondary hyperparathyroidism of renal origin: Secondary | ICD-10-CM | POA: Diagnosis not present

## 2022-04-19 DIAGNOSIS — N186 End stage renal disease: Secondary | ICD-10-CM | POA: Diagnosis not present

## 2022-04-22 DIAGNOSIS — Z79899 Other long term (current) drug therapy: Secondary | ICD-10-CM | POA: Diagnosis not present

## 2022-04-22 DIAGNOSIS — N186 End stage renal disease: Secondary | ICD-10-CM | POA: Diagnosis not present

## 2022-04-22 DIAGNOSIS — Z992 Dependence on renal dialysis: Secondary | ICD-10-CM | POA: Diagnosis not present

## 2022-04-22 DIAGNOSIS — N2581 Secondary hyperparathyroidism of renal origin: Secondary | ICD-10-CM | POA: Diagnosis not present

## 2022-04-24 DIAGNOSIS — N186 End stage renal disease: Secondary | ICD-10-CM | POA: Diagnosis not present

## 2022-04-24 DIAGNOSIS — Z992 Dependence on renal dialysis: Secondary | ICD-10-CM | POA: Diagnosis not present

## 2022-04-24 DIAGNOSIS — N2581 Secondary hyperparathyroidism of renal origin: Secondary | ICD-10-CM | POA: Diagnosis not present

## 2022-04-25 DIAGNOSIS — Z992 Dependence on renal dialysis: Secondary | ICD-10-CM | POA: Diagnosis not present

## 2022-04-25 DIAGNOSIS — N186 End stage renal disease: Secondary | ICD-10-CM | POA: Diagnosis not present

## 2022-04-26 DIAGNOSIS — N186 End stage renal disease: Secondary | ICD-10-CM | POA: Diagnosis not present

## 2022-04-26 DIAGNOSIS — N2581 Secondary hyperparathyroidism of renal origin: Secondary | ICD-10-CM | POA: Diagnosis not present

## 2022-04-26 DIAGNOSIS — Z992 Dependence on renal dialysis: Secondary | ICD-10-CM | POA: Diagnosis not present

## 2022-04-29 DIAGNOSIS — Z992 Dependence on renal dialysis: Secondary | ICD-10-CM | POA: Diagnosis not present

## 2022-04-29 DIAGNOSIS — N186 End stage renal disease: Secondary | ICD-10-CM | POA: Diagnosis not present

## 2022-04-29 DIAGNOSIS — N2581 Secondary hyperparathyroidism of renal origin: Secondary | ICD-10-CM | POA: Diagnosis not present

## 2022-04-30 ENCOUNTER — Other Ambulatory Visit (INDEPENDENT_AMBULATORY_CARE_PROVIDER_SITE_OTHER): Payer: Self-pay | Admitting: Vascular Surgery

## 2022-04-30 DIAGNOSIS — Z95828 Presence of other vascular implants and grafts: Secondary | ICD-10-CM

## 2022-04-30 DIAGNOSIS — N186 End stage renal disease: Secondary | ICD-10-CM

## 2022-05-01 DIAGNOSIS — N2581 Secondary hyperparathyroidism of renal origin: Secondary | ICD-10-CM | POA: Diagnosis not present

## 2022-05-01 DIAGNOSIS — Z992 Dependence on renal dialysis: Secondary | ICD-10-CM | POA: Diagnosis not present

## 2022-05-01 DIAGNOSIS — N186 End stage renal disease: Secondary | ICD-10-CM | POA: Diagnosis not present

## 2022-05-02 ENCOUNTER — Encounter (INDEPENDENT_AMBULATORY_CARE_PROVIDER_SITE_OTHER): Payer: Self-pay | Admitting: Nurse Practitioner

## 2022-05-02 ENCOUNTER — Ambulatory Visit (INDEPENDENT_AMBULATORY_CARE_PROVIDER_SITE_OTHER): Payer: Medicare HMO

## 2022-05-02 ENCOUNTER — Ambulatory Visit (INDEPENDENT_AMBULATORY_CARE_PROVIDER_SITE_OTHER): Payer: Medicare HMO | Admitting: Nurse Practitioner

## 2022-05-02 VITALS — BP 180/89 | HR 88 | Resp 16 | Ht 63.0 in | Wt 157.0 lb

## 2022-05-02 DIAGNOSIS — N186 End stage renal disease: Secondary | ICD-10-CM

## 2022-05-02 DIAGNOSIS — I10 Essential (primary) hypertension: Secondary | ICD-10-CM

## 2022-05-02 DIAGNOSIS — Z95828 Presence of other vascular implants and grafts: Secondary | ICD-10-CM | POA: Diagnosis not present

## 2022-05-02 DIAGNOSIS — E7800 Pure hypercholesterolemia, unspecified: Secondary | ICD-10-CM

## 2022-05-03 DIAGNOSIS — Z992 Dependence on renal dialysis: Secondary | ICD-10-CM | POA: Diagnosis not present

## 2022-05-03 DIAGNOSIS — N186 End stage renal disease: Secondary | ICD-10-CM | POA: Diagnosis not present

## 2022-05-03 DIAGNOSIS — N2581 Secondary hyperparathyroidism of renal origin: Secondary | ICD-10-CM | POA: Diagnosis not present

## 2022-05-06 DIAGNOSIS — N186 End stage renal disease: Secondary | ICD-10-CM | POA: Diagnosis not present

## 2022-05-06 DIAGNOSIS — N2581 Secondary hyperparathyroidism of renal origin: Secondary | ICD-10-CM | POA: Diagnosis not present

## 2022-05-06 DIAGNOSIS — Z992 Dependence on renal dialysis: Secondary | ICD-10-CM | POA: Diagnosis not present

## 2022-05-08 DIAGNOSIS — N186 End stage renal disease: Secondary | ICD-10-CM | POA: Diagnosis not present

## 2022-05-08 DIAGNOSIS — Z992 Dependence on renal dialysis: Secondary | ICD-10-CM | POA: Diagnosis not present

## 2022-05-08 DIAGNOSIS — N2581 Secondary hyperparathyroidism of renal origin: Secondary | ICD-10-CM | POA: Diagnosis not present

## 2022-05-10 DIAGNOSIS — N186 End stage renal disease: Secondary | ICD-10-CM | POA: Diagnosis not present

## 2022-05-10 DIAGNOSIS — N2581 Secondary hyperparathyroidism of renal origin: Secondary | ICD-10-CM | POA: Diagnosis not present

## 2022-05-10 DIAGNOSIS — Z992 Dependence on renal dialysis: Secondary | ICD-10-CM | POA: Diagnosis not present

## 2022-05-13 ENCOUNTER — Encounter (INDEPENDENT_AMBULATORY_CARE_PROVIDER_SITE_OTHER): Payer: Self-pay | Admitting: Nurse Practitioner

## 2022-05-13 DIAGNOSIS — N2581 Secondary hyperparathyroidism of renal origin: Secondary | ICD-10-CM | POA: Diagnosis not present

## 2022-05-13 DIAGNOSIS — N186 End stage renal disease: Secondary | ICD-10-CM | POA: Diagnosis not present

## 2022-05-13 DIAGNOSIS — Z992 Dependence on renal dialysis: Secondary | ICD-10-CM | POA: Diagnosis not present

## 2022-05-13 NOTE — H&P (View-Only) (Signed)
Subjective:    Patient ID: James Black, male    DOB: 1955/07/23, 67 y.o.   MRN: [id number] Chief Complaint  Patient presents with   Follow-up    ultrasound    The patient returns to the office for follow up regarding a problem with their dialysis access.   The patient notes a significant increase in bleeding time after decannulation.  The patient has also been informed that there is increased recirculation.    The patient denies hand pain or other symptoms consistent with steal phenomena.  No significant arm swelling.  The patient denies redness or swelling at the access site. The patient denies fever or chills at home or while on dialysis.  No recent shortening of the patient's walking distance or new symptoms consistent with claudication.  No history of rest pain symptoms. No new ulcers or wounds of the lower extremities have occurred.  The patient denies amaurosis fugax or recent TIA symptoms. There are no recent neurological changes noted. There is no history of DVT, PE or superficial thrombophlebitis. No recent episodes of angina or shortness of breath documented.    Flow volume today is 2335 cc/min (the AV graft is widely patent however there is high flow velocity throughout.  The mid upper arm.  AV graft shows damage due to multiple repetitive sticks    Review of Systems  Hematological:  Bruises/bleeds easily.  All other systems reviewed and are negative.      Objective:   Physical Exam Vitals reviewed.  HENT:     Head: Normocephalic.  Cardiovascular:     Rate and Rhythm: Normal rate.     Pulses: Normal pulses.  Pulmonary:     Effort: Pulmonary effort is normal.  Skin:    General: Skin is warm and dry.  Neurological:     Mental Status: He is alert and oriented to person, place, and time.  Psychiatric:        Mood and Affect: Mood normal.        Behavior: Behavior normal.        Thought Content: Thought content normal.        Judgment: Judgment normal.      BP (!) 180/89 (BP Location: Right Arm)   Pulse 88   Resp 16   Ht '5\' 3"'$  (1.6 m)   Wt 157 lb (71.2 kg)   BMI 27.81 kg/m   Past Medical History:  Diagnosis Date   Anemia    Arthritis    Cerebral palsy (HCC)    DDD (degenerative disc disease), lumbar    Deficiency of vitamin B12    Encephalitis    ESRD (end stage renal disease) on dialysis (HCC)    M-W-F   GERD (gastroesophageal reflux disease)    Hemorrhoid    Hypercholesterolemia    Hyperkalemia    Hypertension    PONV (postoperative nausea and vomiting)    n/v after 08-16-21 capd revision   Tremor    Tubular adenoma of colon    Vitamin D deficiency     Social History   Socioeconomic History   Marital status: Single    Spouse name: Not on file   Number of children: Not on file   Years of education: Not on file   Highest education level: Not on file  Occupational History   Not on file  Tobacco Use   Smoking status: Never    Passive exposure: Never   Smokeless tobacco: Never  Vaping Use   Vaping  Use: Never used  Substance and Sexual Activity   Alcohol use: No   Drug use: No   Sexual activity: Not on file  Other Topics Concern   Not on file  Social History Narrative   Lives with niece   Social Determinants of Health   Financial Resource Strain: Not on file  Food Insecurity: Not on file  Transportation Needs: Not on file  Physical Activity: Not on file  Stress: Not on file  Social Connections: Not on file  Intimate Partner Violence: Not on file    Past Surgical History:  Procedure Laterality Date   AV FISTULA PLACEMENT Left 12/06/2021   Procedure: INSERTION OF ARTERIOVENOUS (AV) GORE-TEX GRAFT ARM (BRACHIAL AXILLARY);  Surgeon: Algernon Huxley, MD;  Location: [facility];  Service: Vascular;  Laterality: Left;   CAPD INSERTION N/A 08/10/2020   Procedure: LAPAROSCOPIC INSERTION CONTINUOUS AMBULATORY PERITONEAL DIALYSIS  (CAPD) CATHETER;  Surgeon: Jules Husbands, MD;  Location: [facility];  Service:  General;  Laterality: N/A;   CAPD REMOVAL N/A 11/08/2021   Procedure: REMOVAL CONTINUOUS AMBULATORY PERITONEAL DIALYSIS  (CAPD) CATHETER;  Surgeon: Jules Husbands, MD;  Location: [facility];  Service: General;  Laterality: N/A;   CAPD REVISION N/A 08/16/2021   Procedure: LAPAROSCOPIC REVISION CONTINUOUS AMBULATORY PERITONEAL DIALYSIS  (CAPD) CATHETER;  Surgeon: Jules Husbands, MD;  Location: [facility];  Service: General;  Laterality: N/A;   COLONOSCOPY     2003, 2011   COLONOSCOPY WITH PROPOFOL N/A 05/25/2015   Procedure: COLONOSCOPY WITH PROPOFOL;  Surgeon: Manya Silvas, MD;  Location: [facility] ENDOSCOPY;  Service: Endoscopy;  Laterality: N/A;   COLONOSCOPY WITH PROPOFOL N/A 05/12/2020   Procedure: COLONOSCOPY WITH PROPOFOL;  Surgeon: Lesly Rubenstein, MD;  Location: [facility] ENDOSCOPY;  Service: Endoscopy;  Laterality: N/A;   DIALYSIS/PERMA CATHETER REMOVAL N/A 02/07/2022   Procedure: DIALYSIS/PERMA CATHETER REMOVAL;  Surgeon: Algernon Huxley, MD;  Location: [place] [facility];  Service: Cardiovascular;  Laterality: N/A;   ESOPHAGOGASTRODUODENOSCOPY     2003, 2011   ESOPHAGOGASTRODUODENOSCOPY (EGD) WITH PROPOFOL N/A 05/12/2020   Procedure: ESOPHAGOGASTRODUODENOSCOPY (EGD) WITH PROPOFOL;  Surgeon: Lesly Rubenstein, MD;  Location: [facility] ENDOSCOPY;  Service: Endoscopy;  Laterality: N/A;   FRACTURE SURGERY Left 1997   arm   XI ROBOTIC ASSISTED INGUINAL HERNIA REPAIR WITH MESH Right 08/23/2021   Procedure: XI ROBOTIC ASSISTED INGUINAL HERNIA REPAIR WITH MESH;  Surgeon: Jules Husbands, MD;  Location: [facility];  Service: General;  Laterality: Right;    History reviewed. No pertinent family history.  Allergies  Allergen Reactions   Cefuroxime Rash    TOLERATED CEFAZOLIN   Tramadol Nausea Only and Other (See Comments)    Dizziness       Latest Ref Rng & Units 12/15/2021    5:00 AM 12/14/2021    4:00 AM 12/13/2021    5:15 AM  CBC  WBC 4.0 - 10.5 K/uL 8.0  7.8  8.4   Hemoglobin 13.0 - 17.0 g/dL  11.1  10.7  11.6   Hematocrit 39.0 - 52.0 % 34.7  35.0  36.9   Platelets 150 - 400 K/uL 220  181  207       CMP     Component Value Date/Time   NA 126 (L) 12/16/2021 0520   K 3.8 12/16/2021 0520   CL 91 (L) 12/16/2021 0520   CO2 25 12/16/2021 0520   GLUCOSE 92 12/16/2021 0520   BUN 35 (H) 12/16/2021 0520   CREATININE 7.84 (H) 12/16/2021  1638   CALCIUM 7.6 (L) 12/16/2021 0520   ALBUMIN 3.1 (L) 12/15/2021 0600   GFRNONAA 7 (L) 12/16/2021 0520     No results found.     Assessment & Plan:   1. ESRD (end stage renal disease) ([place]) Recommend:  The patient is experiencing increasing problems with their dialysis access.  Patient should have a fistulagram with the intention for intervention.  The intention for intervention is to restore appropriate flow and prevent thrombosis and possible loss of the access.  As well as improve the quality of dialysis therapy.  The risks, benefits and alternative therapies were reviewed in detail with the patient.  All questions were answered.  The patient agrees to proceed with angio/intervention.    The patient will follow up with me in the office after the procedure.  - VAS US DUPLEX DIALYSIS ACCESS (AVF, AVG)  2. Hypercholesterolemia Continue statin as ordered and reviewed, no changes at this time  3. Benign essential hypertension Continue antihypertensive medications as already ordered, these medications have been reviewed and there are no changes at this time.   Current Outpatient Medications on File Prior to Visit  Medication Sig Dispense Refill   acetaminophen (TYLENOL) 325 MG tablet Take 650 mg by mouth every 6 (six) hours as needed for mild pain.     amLODipine (NORVASC) 5 MG tablet Take 5 mg by mouth every morning.     APIXABAN (ELIQUIS) VTE STARTER PACK ('10MG'$  AND '5MG'$ ) Take as directed on package: start with two-'5mg'$  tablets twice daily for 7 days. (Already been on since 7/20.)  On day 7/27, switch to one-'5mg'$  tablet twice daily. 1  each 0   atorvastatin (LIPITOR) 10 MG tablet Take 10 mg by mouth every morning.     Cholecalciferol 5000 units TABS Take 5,000 Units by mouth once a week.     dorzolamide-timolol (COSOPT) 22.3-6.8 MG/ML ophthalmic solution Place 1 drop into both eyes 2 (two) times daily.     fluticasone (FLONASE) 50 MCG/ACT nasal spray Place 2 sprays into both nostrils daily as needed for allergies or rhinitis.     furosemide (LASIX) 80 MG tablet Take 80 mg by mouth daily.     guaiFENesin-dextromethorphan (ROBITUSSIN DM) 100-10 MG/5ML syrup Take 5 mLs by mouth every 4 (four) hours as needed for cough. 118 mL 0   hydrALAZINE (APRESOLINE) 25 MG tablet Take 1 tablet (25 mg total) by mouth every 8 (eight) hours. 90 tablet 2   lactulose (CHRONULAC) 10 GM/15ML solution Take 20 g by mouth 2 (two) times daily.     lidocaine-prilocaine (EMLA) cream Apply topically daily.     losartan (COZAAR) 50 MG tablet Take 50 mg by mouth at bedtime.     metoprolol tartrate (LOPRESSOR) 25 MG tablet Take 25 mg by mouth every morning.     Netarsudil-Latanoprost (ROCKLATAN) 0.02-0.005 % SOLN Place 1 drop into both eyes every evening.     pantoprazole (PROTONIX) 20 MG tablet Take 20 mg by mouth every morning.     calcium acetate (PHOSLO) 667 MG capsule Take 667 mg by mouth 3 (three) times daily.     HYDROcodone-acetaminophen (NORCO/VICODIN) 5-325 MG tablet Take 1-2 tablets by mouth every 6 (six) hours as needed for moderate pain. (Patient not taking: Reported on 05/02/2022) 12 tablet 0   No current facility-administered medications on file prior to visit.    There are no Patient Instructions on file for this visit. No follow-ups on file.   Kris Hartmann, NP

## 2022-05-13 NOTE — Progress Notes (Signed)
Subjective:    Patient ID: James Black, male    DOB: March 19, 1956, 66 y.o.   MRN: [id number] Chief Complaint  Patient presents with   Follow-up    ultrasound    The patient returns to the office for follow up regarding a problem with their dialysis access.   The patient notes a significant increase in bleeding time after decannulation.  The patient has also been informed that there is increased recirculation.    The patient denies hand pain or other symptoms consistent with steal phenomena.  No significant arm swelling.  The patient denies redness or swelling at the access site. The patient denies fever or chills at home or while on dialysis.  No recent shortening of the patient's walking distance or new symptoms consistent with claudication.  No history of rest pain symptoms. No new ulcers or wounds of the lower extremities have occurred.  The patient denies amaurosis fugax or recent TIA symptoms. There are no recent neurological changes noted. There is no history of DVT, PE or superficial thrombophlebitis. No recent episodes of angina or shortness of breath documented.    Flow volume today is 2335 cc/min (the AV graft is widely patent however there is high flow velocity throughout.  The mid upper arm.  AV graft shows damage due to multiple repetitive sticks    Review of Systems  Hematological:  Bruises/bleeds easily.  All other systems reviewed and are negative.      Objective:   Physical Exam Vitals reviewed.  HENT:     Head: Normocephalic.  Cardiovascular:     Rate and Rhythm: Normal rate.     Pulses: Normal pulses.  Pulmonary:     Effort: Pulmonary effort is normal.  Skin:    General: Skin is warm and dry.  Neurological:     Mental Status: He is alert and oriented to person, place, and time.  Psychiatric:        Mood and Affect: Mood normal.        Behavior: Behavior normal.        Thought Content: Thought content normal.        Judgment: Judgment normal.      BP (!) 180/89 (BP Location: Right Arm)   Pulse 88   Resp 16   Ht '5\' 3"'$  (1.6 m)   Wt 157 lb (71.2 kg)   BMI 27.81 kg/m   Past Medical History:  Diagnosis Date   Anemia    Arthritis    Cerebral palsy (HCC)    DDD (degenerative disc disease), lumbar    Deficiency of vitamin B12    Encephalitis    ESRD (end stage renal disease) on dialysis (HCC)    M-W-F   GERD (gastroesophageal reflux disease)    Hemorrhoid    Hypercholesterolemia    Hyperkalemia    Hypertension    PONV (postoperative nausea and vomiting)    n/v after 08-16-21 capd revision   Tremor    Tubular adenoma of colon    Vitamin D deficiency     Social History   Socioeconomic History   Marital status: Single    Spouse name: Not on file   Number of children: Not on file   Years of education: Not on file   Highest education level: Not on file  Occupational History   Not on file  Tobacco Use   Smoking status: Never    Passive exposure: Never   Smokeless tobacco: Never  Vaping Use   Vaping  Use: Never used  Substance and Sexual Activity   Alcohol use: No   Drug use: No   Sexual activity: Not on file  Other Topics Concern   Not on file  Social History Narrative   Lives with niece   Social Determinants of Health   Financial Resource Strain: Not on file  Food Insecurity: Not on file  Transportation Needs: Not on file  Physical Activity: Not on file  Stress: Not on file  Social Connections: Not on file  Intimate Partner Violence: Not on file    Past Surgical History:  Procedure Laterality Date   AV FISTULA PLACEMENT Left 12/06/2021   Procedure: INSERTION OF ARTERIOVENOUS (AV) GORE-TEX GRAFT ARM (BRACHIAL AXILLARY);  Surgeon: Algernon Huxley, MD;  Location: [facility] ORS;  Service: Vascular;  Laterality: Left;   CAPD INSERTION N/A 08/10/2020   Procedure: LAPAROSCOPIC INSERTION CONTINUOUS AMBULATORY PERITONEAL DIALYSIS  (CAPD) CATHETER;  Surgeon: Jules Husbands, MD;  Location: [facility];  Service:  General;  Laterality: N/A;   CAPD REMOVAL N/A 11/08/2021   Procedure: REMOVAL CONTINUOUS AMBULATORY PERITONEAL DIALYSIS  (CAPD) CATHETER;  Surgeon: Jules Husbands, MD;  Location: [facility] ORS;  Service: General;  Laterality: N/A;   CAPD REVISION N/A 08/16/2021   Procedure: LAPAROSCOPIC REVISION CONTINUOUS AMBULATORY PERITONEAL DIALYSIS  (CAPD) CATHETER;  Surgeon: Jules Husbands, MD;  Location: [facility];  Service: General;  Laterality: N/A;   COLONOSCOPY     2003, 2011   COLONOSCOPY WITH PROPOFOL N/A 05/25/2015   Procedure: COLONOSCOPY WITH PROPOFOL;  Surgeon: Manya Silvas, MD;  Location: [facility];  Service: Endoscopy;  Laterality: N/A;   COLONOSCOPY WITH PROPOFOL N/A 05/12/2020   Procedure: COLONOSCOPY WITH PROPOFOL;  Surgeon: Lesly Rubenstein, MD;  Location: [facility] ENDOSCOPY;  Service: Endoscopy;  Laterality: N/A;   DIALYSIS/PERMA CATHETER REMOVAL N/A 02/07/2022   Procedure: DIALYSIS/PERMA CATHETER REMOVAL;  Surgeon: Algernon Huxley, MD;  Location: [facility];  Service: Cardiovascular;  Laterality: N/A;   ESOPHAGOGASTRODUODENOSCOPY     2003, 2011   ESOPHAGOGASTRODUODENOSCOPY (EGD) WITH PROPOFOL N/A 05/12/2020   Procedure: ESOPHAGOGASTRODUODENOSCOPY (EGD) WITH PROPOFOL;  Surgeon: Lesly Rubenstein, MD;  Location: [facility] ENDOSCOPY;  Service: Endoscopy;  Laterality: N/A;   FRACTURE SURGERY Left 1997   arm   XI ROBOTIC ASSISTED INGUINAL HERNIA REPAIR WITH MESH Right 08/23/2021   Procedure: XI ROBOTIC ASSISTED INGUINAL HERNIA REPAIR WITH MESH;  Surgeon: Jules Husbands, MD;  Location: [facility] OR[facility];  Service: General;  Laterality: Right;    History reviewed. No pertinent family history.  Allergies  Allergen Reactions   Cefuroxime Rash    TOLERATED CEFAZOLIN   Tramadol Nausea Only and Other (See Comments)    Dizziness       Latest Ref Rng & Units 12/15/2021    5:00 AM 12/14/2021    4:00 AM 12/13/2021    5:15 AM  CBC  WBC 4.0 - 10.5 K/uL 8.0  7.8  8.4   Hemoglobin 13.0 - 17.0 g/dL  11.1  10.7  11.6   Hematocrit 39.0 - 52.0 % 34.7  35.0  36.9   Platelets 150 - 400 K/uL 220  181  207       CMP     Component Value Date/Time   NA 126 (L) 12/16/2021 0520   K 3.8 12/16/2021 0520   CL 91 (L) 12/16/2021 0520   CO2 25 12/16/2021 0520   GLUCOSE 92 12/16/2021 0520   BUN 35 (H) 12/16/2021 0520   CREATININE 7.84 (H) 12/16/2021  8563   CALCIUM 7.6 (L) 12/16/2021 0520   ALBUMIN 3.1 (L) 12/15/2021 0600   GFRNONAA 7 (L) 12/16/2021 0520     No results found.     Assessment & Plan:   1. ESRD (end stage renal disease) ([place]) Recommend:  The patient is experiencing increasing problems with their dialysis access.  Patient should have a fistulagram with the intention for intervention.  The intention for intervention is to restore appropriate flow and prevent thrombosis and possible loss of the access.  As well as improve the quality of dialysis therapy.  The risks, benefits and alternative therapies were reviewed in detail with the patient.  All questions were answered.  The patient agrees to proceed with angio/intervention.    The patient will follow up with me in the office after the procedure.  - VAS US DUPLEX DIALYSIS ACCESS (AVF, AVG)  2. Hypercholesterolemia Continue statin as ordered and reviewed, no changes at this time  3. Benign essential hypertension Continue antihypertensive medications as already ordered, these medications have been reviewed and there are no changes at this time.   Current Outpatient Medications on File Prior to Visit  Medication Sig Dispense Refill   acetaminophen (TYLENOL) 325 MG tablet Take 650 mg by mouth every 6 (six) hours as needed for mild pain.     amLODipine (NORVASC) 5 MG tablet Take 5 mg by mouth every morning.     APIXABAN (ELIQUIS) VTE STARTER PACK ('10MG'$  AND '5MG'$ ) Take as directed on package: start with two-'5mg'$  tablets twice daily for 7 days. (Already been on since 7/20.)  On day 7/27, switch to one-'5mg'$  tablet twice daily. 1  each 0   atorvastatin (LIPITOR) 10 MG tablet Take 10 mg by mouth every morning.     Cholecalciferol 5000 units TABS Take 5,000 Units by mouth once a week.     dorzolamide-timolol (COSOPT) 22.3-6.8 MG/ML ophthalmic solution Place 1 drop into both eyes 2 (two) times daily.     fluticasone (FLONASE) 50 MCG/ACT nasal spray Place 2 sprays into both nostrils daily as needed for allergies or rhinitis.     furosemide (LASIX) 80 MG tablet Take 80 mg by mouth daily.     guaiFENesin-dextromethorphan (ROBITUSSIN DM) 100-10 MG/5ML syrup Take 5 mLs by mouth every 4 (four) hours as needed for cough. 118 mL 0   hydrALAZINE (APRESOLINE) 25 MG tablet Take 1 tablet (25 mg total) by mouth every 8 (eight) hours. 90 tablet 2   lactulose (CHRONULAC) 10 GM/15ML solution Take 20 g by mouth 2 (two) times daily.     lidocaine-prilocaine (EMLA) cream Apply topically daily.     losartan (COZAAR) 50 MG tablet Take 50 mg by mouth at bedtime.     metoprolol tartrate (LOPRESSOR) 25 MG tablet Take 25 mg by mouth every morning.     Netarsudil-Latanoprost (ROCKLATAN) 0.02-0.005 % SOLN Place 1 drop into both eyes every evening.     pantoprazole (PROTONIX) 20 MG tablet Take 20 mg by mouth every morning.     calcium acetate (PHOSLO) 667 MG capsule Take 667 mg by mouth 3 (three) times daily.     HYDROcodone-acetaminophen (NORCO/VICODIN) 5-325 MG tablet Take 1-2 tablets by mouth every 6 (six) hours as needed for moderate pain. (Patient not taking: Reported on 05/02/2022) 12 tablet 0   No current facility-administered medications on file prior to visit.    There are no Patient Instructions on file for this visit. No follow-ups on file.   Kris Hartmann, NP

## 2022-05-15 DIAGNOSIS — N2581 Secondary hyperparathyroidism of renal origin: Secondary | ICD-10-CM | POA: Diagnosis not present

## 2022-05-15 DIAGNOSIS — N186 End stage renal disease: Secondary | ICD-10-CM | POA: Diagnosis not present

## 2022-05-15 DIAGNOSIS — Z992 Dependence on renal dialysis: Secondary | ICD-10-CM | POA: Diagnosis not present

## 2022-05-16 ENCOUNTER — Encounter
Admission: RE | Disposition: A | Discharge status: Home / Self Care | Payer: Self-pay | Source: Home / Self Care | Attending: Vascular Surgery

## 2022-05-16 ENCOUNTER — Ambulatory Visit
Admission: RE | Admit: 2022-05-16 | Discharge: 2022-05-16 | Disposition: A | Discharge status: Home / Self Care | Payer: Medicare HMO | Attending: Vascular Surgery | Admitting: Vascular Surgery

## 2022-05-16 ENCOUNTER — Encounter: Payer: Self-pay | Admitting: Vascular Surgery

## 2022-05-16 DIAGNOSIS — Z992 Dependence on renal dialysis: Secondary | ICD-10-CM | POA: Insufficient documentation

## 2022-05-16 DIAGNOSIS — N186 End stage renal disease: Secondary | ICD-10-CM | POA: Insufficient documentation

## 2022-05-16 DIAGNOSIS — E7800 Pure hypercholesterolemia, unspecified: Secondary | ICD-10-CM | POA: Diagnosis not present

## 2022-05-16 DIAGNOSIS — I120 Hypertensive chronic kidney disease with stage 5 chronic kidney disease or end stage renal disease: Secondary | ICD-10-CM | POA: Diagnosis not present

## 2022-05-16 DIAGNOSIS — T82858A Stenosis of vascular prosthetic devices, implants and grafts, initial encounter: Secondary | ICD-10-CM | POA: Diagnosis not present

## 2022-05-16 DIAGNOSIS — Y841 Kidney dialysis as the cause of abnormal reaction of the patient, or of later complication, without mention of misadventure at the time of the procedure: Secondary | ICD-10-CM | POA: Diagnosis not present

## 2022-05-16 HISTORY — PX: A/V FISTULAGRAM: CATH118298

## 2022-05-16 LAB — POTASSIUM (ARMC VASCULAR LAB ONLY): Potassium (ARMC vascular lab): 3.8 mmol/L (ref 3.5–5.1)

## 2022-05-16 SURGERY — A/V FISTULAGRAM
Anesthesia: Moderate Sedation | Laterality: Left

## 2022-05-16 MED ORDER — METHYLPREDNISOLONE SODIUM SUCC 125 MG IJ SOLR: 125.0000 mg | Freq: Once | INTRAMUSCULAR | Status: DC | PRN

## 2022-05-16 MED ORDER — ONDANSETRON HCL 4 MG/2ML IJ SOLN: 4.0000 mg | Freq: Four times a day (QID) | INTRAMUSCULAR | Status: DC | PRN

## 2022-05-16 MED ORDER — IODIXANOL 320 MG/ML IV SOLN
INTRAVENOUS | Status: DC | PRN
  Administered 2022-05-16: 20 mL via INTRAVENOUS

## 2022-05-16 MED ORDER — HEPARIN SODIUM (PORCINE) 1000 UNIT/ML IJ SOLN
INTRAMUSCULAR | Status: AC
  Filled 2022-05-16: qty 10

## 2022-05-16 MED ORDER — VANCOMYCIN HCL IN DEXTROSE 1-5 GM/200ML-% IV SOLN
INTRAVENOUS | Status: AC
  Filled 2022-05-16: qty 200

## 2022-05-16 MED ORDER — MIDAZOLAM HCL 2 MG/ML PO SYRP: 8.0000 mg | ORAL_SOLUTION | Freq: Once | ORAL | Status: DC | PRN

## 2022-05-16 MED ORDER — SODIUM CHLORIDE 0.9 % IV SOLN: INTRAVENOUS | Status: DC

## 2022-05-16 MED ORDER — DIPHENHYDRAMINE HCL 50 MG/ML IJ SOLN: 50.0000 mg | Freq: Once | INTRAMUSCULAR | Status: DC | PRN

## 2022-05-16 MED ORDER — VANCOMYCIN HCL IN DEXTROSE 1-5 GM/200ML-% IV SOLN: 1000.0000 mg | INTRAVENOUS | Status: DC

## 2022-05-16 MED ORDER — FENTANYL CITRATE (PF) 100 MCG/2ML IJ SOLN
INTRAMUSCULAR | Status: DC | PRN
  Administered 2022-05-16: 50 ug via INTRAVENOUS

## 2022-05-16 MED ORDER — MIDAZOLAM HCL 2 MG/2ML IJ SOLN
INTRAMUSCULAR | Status: AC
  Filled 2022-05-16: qty 2

## 2022-05-16 MED ORDER — FENTANYL CITRATE PF 50 MCG/ML IJ SOSY
PREFILLED_SYRINGE | INTRAMUSCULAR | Status: AC
  Filled 2022-05-16: qty 1

## 2022-05-16 MED ORDER — HYDROMORPHONE HCL 1 MG/ML IJ SOLN: 1.0000 mg | Freq: Once | INTRAMUSCULAR | Status: DC | PRN

## 2022-05-16 MED ORDER — MIDAZOLAM HCL 2 MG/2ML IJ SOLN
INTRAMUSCULAR | Status: DC | PRN
  Administered 2022-05-16: 2 mg via INTRAVENOUS

## 2022-05-16 MED ORDER — FAMOTIDINE 20 MG PO TABS: 40.0000 mg | ORAL_TABLET | Freq: Once | ORAL | Status: DC | PRN

## 2022-05-16 SURGICAL SUPPLY — 12 items
BALLN LUTONIX DCB 7X60X130 (BALLOONS) ×1
BALLOON LUTONIX DCB 7X60X130 (BALLOONS) IMPLANT
COVER PROBE ULTRASOUND 5X96 (MISCELLANEOUS) IMPLANT
DRAPE BRACHIAL (DRAPES) IMPLANT
KIT ENCORE 26 ADVANTAGE (KITS) IMPLANT
KIT MICROPUNCTURE NIT STIFF (SHEATH) IMPLANT
NDL ENTRY 21GA 7CM ECHOTIP (NEEDLE) IMPLANT
NEEDLE ENTRY 21GA 7CM ECHOTIP (NEEDLE) ×1 IMPLANT
PACK ANGIOGRAPHY (CUSTOM PROCEDURE TRAY) ×1 IMPLANT
SHEATH BRITE TIP 6FRX5.5 (SHEATH) IMPLANT
SUT MNCRL AB 4-0 PS2 18 (SUTURE) IMPLANT
WIRE SUPRACORE 190CM (MISCELLANEOUS) IMPLANT

## 2022-05-16 NOTE — Op Note (Signed)
[facility]    OPERATIVE NOTE   PROCEDURE: 1.  Left brachial artery to axillary vein arteriovenous graft cannulation under ultrasound guidance 2.  Left arm shuntogram 3.  Percutaneous transluminal angioplasty of venous anastomosis with 7 mm diameter by 6 cm length Lutonix drug-coated angioplasty 1  PRE-OPERATIVE DIAGNOSIS: 1. ESRD 2. Malfunctioning left brachial artery to axillary vein arteriovenous graft  POST-OPERATIVE DIAGNOSIS: same as above   SURGEON: Jason Dew, MD  ANESTHESIA: local with MCS  ESTIMATED BLOOD LOSS: 5 cc  FINDING(S): Moderate stenosis of the axillary vein at just beyond the venous anastomosis in the 60 to 65% range.  The remainder of the graft was widely patent and the central venous circulation was widely.  SPECIMEN(S):  None  CONTRAST: 20 cc  FLUORO TIME: 1.4 minutes  MODERATE CONSCIOUS SEDATION TIME:  Approximately 25 minutes using 2 mg of Versed and 50 mcg of Fentanyl  INDICATIONS: James Black is a 66 y.o. male who presents with malfunctioning left brachial artery to axillary vein arteriovenous graft.  The patient is scheduled for left arm shuntogram.  The patient is aware the risks include but are not limited to: bleeding, infection, thrombosis of the cannulated access, and possible anaphylactic reaction to the contrast.  The patient is aware of the risks of the procedure and elects to proceed forward.  DESCRIPTION: After full informed written consent was obtained, the patient was brought back to the angiography suite and placed supine upon the angiography table.  The patient was connected to monitoring equipment. Moderate conscious sedation was administered during a face to face encounter throughout the procedure with my supervision of the RN administering medicines and monitoring the patient's vital signs, pulse oximetry, telemetry and mental status throughout from the start of the procedure until the patient was taken to the  recovery room The left arm was prepped and draped in the standard fashion for a percutaneous access intervention.  Under ultrasound guidance, the left brachial artery to axillary vein arteriovenous graft was cannulated with a micropuncture needle under direct ultrasound guidance were it was patent and a permanent image was performed.  The microwire was advanced into the graft and the needle was exchanged for the a microsheath.  I then upsized to a 6 Fr Sheath and imaging was performed.  Hand injections were completed to image the access including the central venous system. This demonstrated Moderate stenosis of the axillary vein at just beyond the venous anastomosis in the 60 to 65% range.  The remainder of the graft was widely patent and the central venous circulation was widely.  Based on the images, this patient will need intervention to the venous anastomotic stenosis. I then gave the patient 3000 units of intravenous heparin.  I then crossed the stenosis with a supra core wire.  Based on the imaging, a 7 mm x 6 cm Lutonix drug-coated angioplasty balloon was selected.  The balloon was centered around the stenosis and inflated to 12 ATM for 1 minute(s).  On completion imaging, a 10-15% residual stenosis was present.     Based on the completion imaging, no further intervention is necessary.  The wire and balloon were removed from the sheath.  A 4-0 Monocryl purse-string suture was sewn around the sheath.  The sheath was removed while tying down the suture.  A sterile bandage was applied to the puncture site.  COMPLICATIONS: None  CONDITION: Stable   Jason Dew  05/16/2022 11:43 AM    This note was created   with Dragon Medical transcription system. Any errors in dictation are purely unintentional. 

## 2022-05-17 DIAGNOSIS — N186 End stage renal disease: Secondary | ICD-10-CM | POA: Diagnosis not present

## 2022-05-17 DIAGNOSIS — Z992 Dependence on renal dialysis: Secondary | ICD-10-CM | POA: Diagnosis not present

## 2022-05-17 DIAGNOSIS — N2581 Secondary hyperparathyroidism of renal origin: Secondary | ICD-10-CM | POA: Diagnosis not present

## 2022-05-19 DIAGNOSIS — Z992 Dependence on renal dialysis: Secondary | ICD-10-CM | POA: Diagnosis not present

## 2022-05-19 DIAGNOSIS — N186 End stage renal disease: Secondary | ICD-10-CM | POA: Diagnosis not present

## 2022-05-19 DIAGNOSIS — N2581 Secondary hyperparathyroidism of renal origin: Secondary | ICD-10-CM | POA: Diagnosis not present

## 2022-05-21 ENCOUNTER — Encounter: Payer: Self-pay | Admitting: Vascular Surgery

## 2022-05-22 DIAGNOSIS — N186 End stage renal disease: Secondary | ICD-10-CM | POA: Diagnosis not present

## 2022-05-22 DIAGNOSIS — Z992 Dependence on renal dialysis: Secondary | ICD-10-CM | POA: Diagnosis not present

## 2022-05-22 DIAGNOSIS — N2581 Secondary hyperparathyroidism of renal origin: Secondary | ICD-10-CM | POA: Diagnosis not present

## 2022-05-22 DIAGNOSIS — Z79899 Other long term (current) drug therapy: Secondary | ICD-10-CM | POA: Diagnosis not present

## 2022-05-23 ENCOUNTER — Encounter: Payer: Self-pay | Admitting: *Deleted

## 2022-05-23 ENCOUNTER — Telehealth: Payer: Self-pay | Admitting: *Deleted

## 2022-05-23 NOTE — Patient Outreach (Signed)
  Care Coordination   Initial Visit Note   05/23/2022 Name: James Black MRN: [id number] DOB: [birth year]  James Black is a 66 y.o. year old male who sees Feldpausch, Chrissie Noa, MD for primary care. I spoke with James Black, niece of James Black by phone today.  What matters to the patients health and wellness today?  Per James Black, she cares for patient and helps him manage chronic conditions (ESRD, cerebral palsy, etc).  She feels patient is doing well, does not feel follow up call is needed at this time.     Goals Addressed             This Visit's Progress    COMPLETED: Care Coordination Activities - No follow up needed       Care Coordination Interventions: Evaluation of current treatment plan related to ESRD, on HD 3 days a week and patient's adherence to plan as established by provider Advised patient to continue plan of care, including diet and adherence to HD treatements Reviewed medications with patient and discussed adherence and affordability Reviewed scheduled/upcoming provider appointments including follow up with vascular on 2/1 and [facility] on 2/29 Discussed plans with patient for ongoing care management follow up and provided patient with direct contact information for care management team Screening for signs and symptoms of depression related to chronic disease state  Assessed social determinant of health barriers Discussed Vascular access repair done on 12/21, niece denies that patient has had any issues since.          SDOH assessments and interventions completed:  Yes  SDOH Interventions Today    Flowsheet Row Most Recent Value  SDOH Interventions   Food Insecurity Interventions Intervention Not Indicated  Housing Interventions Intervention Not Indicated  Transportation Interventions Intervention Not Indicated        Care Coordination Interventions:  Yes, provided   Follow up plan: No further intervention required.   Encounter Outcome:  Pt. Visit Completed    James David, RN, MSN, [place] Care Management Care Management Coordinator [phone]

## 2022-05-24 DIAGNOSIS — Z992 Dependence on renal dialysis: Secondary | ICD-10-CM | POA: Diagnosis not present

## 2022-05-24 DIAGNOSIS — N2581 Secondary hyperparathyroidism of renal origin: Secondary | ICD-10-CM | POA: Diagnosis not present

## 2022-05-24 DIAGNOSIS — N186 End stage renal disease: Secondary | ICD-10-CM | POA: Diagnosis not present

## 2022-05-26 DIAGNOSIS — N186 End stage renal disease: Secondary | ICD-10-CM | POA: Diagnosis not present

## 2022-05-26 DIAGNOSIS — Z992 Dependence on renal dialysis: Secondary | ICD-10-CM | POA: Diagnosis not present

## 2022-05-27 DIAGNOSIS — N2581 Secondary hyperparathyroidism of renal origin: Secondary | ICD-10-CM | POA: Diagnosis not present

## 2022-05-27 DIAGNOSIS — N186 End stage renal disease: Secondary | ICD-10-CM | POA: Diagnosis not present

## 2022-05-27 DIAGNOSIS — Z992 Dependence on renal dialysis: Secondary | ICD-10-CM | POA: Diagnosis not present

## 2022-05-29 DIAGNOSIS — N2581 Secondary hyperparathyroidism of renal origin: Secondary | ICD-10-CM | POA: Diagnosis not present

## 2022-05-29 DIAGNOSIS — N186 End stage renal disease: Secondary | ICD-10-CM | POA: Diagnosis not present

## 2022-05-29 DIAGNOSIS — Z992 Dependence on renal dialysis: Secondary | ICD-10-CM | POA: Diagnosis not present

## 2022-05-29 NOTE — Interval H&P Note (Signed)
History and Physical Interval Note:  05/29/2022 8:58 AM  James Black  has presented today for surgery, with the diagnosis of L Arm Fistulagram    End Stage Renal.  The various methods of treatment have been discussed with the patient and family. After consideration of risks, benefits and other options for treatment, the patient has consented to  Procedure(s): A/V Fistulagram (Left) as a surgical intervention.  The patient's history has been reviewed, patient examined, no change in status, stable for surgery.  I have reviewed the patient's chart and labs.  Questions were answered to the patient's satisfaction.     [name]

## 2022-05-31 DIAGNOSIS — N2581 Secondary hyperparathyroidism of renal origin: Secondary | ICD-10-CM | POA: Diagnosis not present

## 2022-05-31 DIAGNOSIS — N186 End stage renal disease: Secondary | ICD-10-CM | POA: Diagnosis not present

## 2022-05-31 DIAGNOSIS — Z992 Dependence on renal dialysis: Secondary | ICD-10-CM | POA: Diagnosis not present

## 2022-06-03 DIAGNOSIS — N2581 Secondary hyperparathyroidism of renal origin: Secondary | ICD-10-CM | POA: Diagnosis not present

## 2022-06-03 DIAGNOSIS — Z992 Dependence on renal dialysis: Secondary | ICD-10-CM | POA: Diagnosis not present

## 2022-06-03 DIAGNOSIS — N186 End stage renal disease: Secondary | ICD-10-CM | POA: Diagnosis not present

## 2022-06-05 DIAGNOSIS — Z992 Dependence on renal dialysis: Secondary | ICD-10-CM | POA: Diagnosis not present

## 2022-06-05 DIAGNOSIS — N186 End stage renal disease: Secondary | ICD-10-CM | POA: Diagnosis not present

## 2022-06-05 DIAGNOSIS — N2581 Secondary hyperparathyroidism of renal origin: Secondary | ICD-10-CM | POA: Diagnosis not present

## 2022-06-07 DIAGNOSIS — Z992 Dependence on renal dialysis: Secondary | ICD-10-CM | POA: Diagnosis not present

## 2022-06-07 DIAGNOSIS — N186 End stage renal disease: Secondary | ICD-10-CM | POA: Diagnosis not present

## 2022-06-07 DIAGNOSIS — N2581 Secondary hyperparathyroidism of renal origin: Secondary | ICD-10-CM | POA: Diagnosis not present

## 2022-06-10 DIAGNOSIS — N2581 Secondary hyperparathyroidism of renal origin: Secondary | ICD-10-CM | POA: Diagnosis not present

## 2022-06-10 DIAGNOSIS — N186 End stage renal disease: Secondary | ICD-10-CM | POA: Diagnosis not present

## 2022-06-10 DIAGNOSIS — Z992 Dependence on renal dialysis: Secondary | ICD-10-CM | POA: Diagnosis not present

## 2022-06-12 DIAGNOSIS — Z992 Dependence on renal dialysis: Secondary | ICD-10-CM | POA: Diagnosis not present

## 2022-06-12 DIAGNOSIS — N2581 Secondary hyperparathyroidism of renal origin: Secondary | ICD-10-CM | POA: Diagnosis not present

## 2022-06-12 DIAGNOSIS — N186 End stage renal disease: Secondary | ICD-10-CM | POA: Diagnosis not present

## 2022-06-14 DIAGNOSIS — N186 End stage renal disease: Secondary | ICD-10-CM | POA: Diagnosis not present

## 2022-06-14 DIAGNOSIS — Z992 Dependence on renal dialysis: Secondary | ICD-10-CM | POA: Diagnosis not present

## 2022-06-14 DIAGNOSIS — N2581 Secondary hyperparathyroidism of renal origin: Secondary | ICD-10-CM | POA: Diagnosis not present

## 2022-06-17 ENCOUNTER — Emergency Department
Admission: EM | Admit: 2022-06-17 | Discharge: 2022-06-17 | Disposition: A | Discharge status: Home / Self Care | Payer: Medicare HMO | Attending: Emergency Medicine | Admitting: Emergency Medicine

## 2022-06-17 ENCOUNTER — Other Ambulatory Visit: Payer: Self-pay

## 2022-06-17 ENCOUNTER — Encounter: Payer: Self-pay | Admitting: Emergency Medicine

## 2022-06-17 DIAGNOSIS — Z79899 Other long term (current) drug therapy: Secondary | ICD-10-CM | POA: Diagnosis not present

## 2022-06-17 DIAGNOSIS — Z7901 Long term (current) use of anticoagulants: Secondary | ICD-10-CM | POA: Insufficient documentation

## 2022-06-17 DIAGNOSIS — N2581 Secondary hyperparathyroidism of renal origin: Secondary | ICD-10-CM | POA: Diagnosis not present

## 2022-06-17 DIAGNOSIS — I770 Arteriovenous fistula, acquired: Secondary | ICD-10-CM

## 2022-06-17 DIAGNOSIS — I10 Essential (primary) hypertension: Secondary | ICD-10-CM | POA: Diagnosis not present

## 2022-06-17 DIAGNOSIS — T82838A Hemorrhage of vascular prosthetic devices, implants and grafts, initial encounter: Secondary | ICD-10-CM | POA: Diagnosis not present

## 2022-06-17 DIAGNOSIS — E785 Hyperlipidemia, unspecified: Secondary | ICD-10-CM | POA: Diagnosis not present

## 2022-06-17 DIAGNOSIS — N186 End stage renal disease: Secondary | ICD-10-CM | POA: Diagnosis not present

## 2022-06-17 DIAGNOSIS — Z992 Dependence on renal dialysis: Secondary | ICD-10-CM | POA: Diagnosis not present

## 2022-06-17 DIAGNOSIS — Z131 Encounter for screening for diabetes mellitus: Secondary | ICD-10-CM | POA: Diagnosis not present

## 2022-06-17 LAB — COMPREHENSIVE METABOLIC PANEL
ALT: 16 U/L (ref 0–44)
AST: 24 U/L (ref 15–41)
Albumin: 4.1 g/dL (ref 3.5–5.0)
Alkaline Phosphatase: 90 U/L (ref 38–126)
Anion gap: 14 (ref 5–15)
BUN: 18 mg/dL (ref 8–23)
CO2: 23 mmol/L (ref 22–32)
Calcium: 8.8 mg/dL — ABNORMAL LOW (ref 8.9–10.3)
Chloride: 96 mmol/L — ABNORMAL LOW (ref 98–111)
Creatinine, Ser: 4.95 mg/dL — ABNORMAL HIGH (ref 0.61–1.24)
GFR, Estimated: 12 mL/min — ABNORMAL LOW (ref >60)
Glucose, Bld: 95 mg/dL (ref 70–99)
Potassium: 3.3 mmol/L — ABNORMAL LOW (ref 3.5–5.1)
Sodium: 133 mmol/L — ABNORMAL LOW (ref 135–145)
Total Bilirubin: 1.2 mg/dL (ref 0.3–1.2)
Total Protein: 7 g/dL (ref 6.5–8.1)

## 2022-06-17 LAB — CBC WITH DIFFERENTIAL/PLATELET
Abs Immature Granulocytes: 0.02 10*3/uL (ref 0.00–0.07)
Basophils Absolute: 0.1 10*3/uL (ref 0.0–0.1)
Basophils Relative: 2 %
Eosinophils Absolute: 0.2 10*3/uL (ref 0.0–0.5)
Eosinophils Relative: 4 %
HCT: 32.2 % — ABNORMAL LOW (ref 39.0–52.0)
Hemoglobin: 10.5 g/dL — ABNORMAL LOW (ref 13.0–17.0)
Immature Granulocytes: 0 %
Lymphocytes Relative: 10 %
Lymphs Abs: 0.6 10*3/uL — ABNORMAL LOW (ref 0.7–4.0)
MCH: 29.5 pg (ref 26.0–34.0)
MCHC: 32.6 g/dL (ref 30.0–36.0)
MCV: 90.4 fL (ref 80.0–100.0)
Monocytes Absolute: 0.4 10*3/uL (ref 0.1–1.0)
Monocytes Relative: 6 %
Neutro Abs: 4.3 10*3/uL (ref 1.7–7.7)
Neutrophils Relative %: 78 %
Platelets: 184 10*3/uL (ref 150–400)
RBC: 3.56 MIL/uL — ABNORMAL LOW (ref 4.22–5.81)
RDW: 14.9 % (ref 11.5–15.5)
WBC: 5.6 10*3/uL (ref 4.0–10.5)
nRBC: 0 % (ref 0.0–0.2)

## 2022-06-17 LAB — LIPASE, BLOOD: Lipase: 41 U/L (ref 11–51)

## 2022-06-17 NOTE — ED Provider Notes (Signed)
[facility] Provider Note    Event Date/Time   First MD Initiated Contact with Patient 06/17/22 1323     (approximate)   History   Vascular Access Problem   HPI  James Black is a 67 y.o. male here with bleeding from left AV fistula.  The patient had a full session of dialysis today.  When they removed his access, he had uncontrolled bleeding.  They applied direct pressure as well as bandages without being able to get it under control.  He is on blood thinners but states he has actually not taken them today.  He subsequent presents for evaluation.  Some mild tenderness where they were putting pressure, but denies any other complaints.  He does state that he has been mildly more nauseous than usual over the last few days, though somewhat vague about this.  Denies any chest pain or shortness of breath.  No lightheadedness.  No syncope.     Physical Exam   Triage Vital Signs: ED Triage Vitals  Enc Vitals Group     BP 06/17/22 1316 (!) 178/93     Pulse Rate 06/17/22 1316 95     Resp 06/17/22 1316 18     Temp 06/17/22 1316 97.6 F (36.4 C)     Temp Source 06/17/22 1316 Oral     SpO2 06/17/22 1316 100 %     Weight 06/17/22 1405 156 lb 15.5 oz (71.2 kg)     Height 06/17/22 1405 '5\' 3"'$  (1.6 m)     Head Circumference --      Peak Flow --      Pain Score 06/17/22 1316 0     Pain Loc --      Pain Edu? --      Excl. in [place]? --     Most recent vital signs: Vitals:   06/17/22 1316  BP: (!) 178/93  Pulse: 95  Resp: 18  Temp: 97.6 F (36.4 C)  SpO2: 100%     General: Awake, no distress.  CV:  Good peripheral perfusion.  Regular rate and rhythm. Resp:  Normal effort.  Lungs clear to auscultation. Abd:  No distention.  Other:  Left AV fistula in place.  Small scab noted to the 2 areas of recent access.  No appreciable hematoma.  Palpable and audible vascular flow noted.   ED Results / Procedures / Treatments   Labs (all labs ordered are listed,  but only abnormal results are displayed) Labs Reviewed  CBC WITH DIFFERENTIAL/PLATELET - Abnormal; Notable for the following components:      Result Value   RBC 3.56 (*)    Hemoglobin 10.5 (*)    HCT 32.2 (*)    Lymphs Abs 0.6 (*)    All other components within normal limits  COMPREHENSIVE METABOLIC PANEL - Abnormal; Notable for the following components:   Sodium 133 (*)    Potassium 3.3 (*)    Chloride 96 (*)    Creatinine, Ser 4.95 (*)    Calcium 8.8 (*)    GFR, Estimated 12 (*)    All other components within normal limits  LIPASE, BLOOD     EKG Normal sinus rhythm, ventricular rate 97.  PR 178, QRS 90, QTc 459.  No acute ST elevations or depressions.  No ischemia or infarct.   RADIOLOGY    I also independently reviewed and agree with radiologist interpretations.   PROCEDURES:  Critical Care performed: No  Wound care: Bandage was removed from  AV fistula site.  The area was cleaned with chlorhexidine.  A thin layer of Dermabond was then applied with excellent hemostasis.  MEDICATIONS ORDERED IN ED: Medications - No data to display   IMPRESSION / MDM / [place] / ED COURSE  I reviewed the triage vital signs and the nursing notes.                              Differential diagnosis includes, but is not limited to, superficial bleed from AV fistula, anticoagulation status, nausea 2/2 uremia, lyte abnormality,   Patient's presentation is most consistent with acute presentation with potential threat to life or bodily function.  67 yo M with PMHx ESRD on HD here with bleeding from AV fistula. H/o PE on anticoagulation but actually held today. Bleeding controlled by my assessment. The area was thoroughly cleaned and a small amount of dermabond applied to prevent re bleeding Otherwise, pt had c/o general fatigue. Labs reassuring and at baseline. No fevers, infectious sx. No signs of significant anemia/blood loss. No apparent emergent pathology.  FINAL CLINICAL  IMPRESSION(S) / ED DIAGNOSES   Final diagnoses:  A-V fistula ([place])     Rx / DC Orders   ED Discharge Orders     None        Note:  This document was prepared using Dragon voice recognition software and may include unintentional dictation errors.   Duffy Bruce, MD 06/17/22 [place]

## 2022-06-17 NOTE — ED Triage Notes (Signed)
Pt to ED via EMS CC of uncontrolled bleeding on left fistula site for approx 2 hours following dialysis. Bleeding is controlled at this time with gauze/pressure.

## 2022-06-19 DIAGNOSIS — Z992 Dependence on renal dialysis: Secondary | ICD-10-CM | POA: Diagnosis not present

## 2022-06-19 DIAGNOSIS — N2581 Secondary hyperparathyroidism of renal origin: Secondary | ICD-10-CM | POA: Diagnosis not present

## 2022-06-19 DIAGNOSIS — N186 End stage renal disease: Secondary | ICD-10-CM | POA: Diagnosis not present

## 2022-06-21 ENCOUNTER — Telehealth: Payer: Self-pay

## 2022-06-21 DIAGNOSIS — N2581 Secondary hyperparathyroidism of renal origin: Secondary | ICD-10-CM | POA: Diagnosis not present

## 2022-06-21 DIAGNOSIS — N186 End stage renal disease: Secondary | ICD-10-CM | POA: Diagnosis not present

## 2022-06-21 DIAGNOSIS — Z992 Dependence on renal dialysis: Secondary | ICD-10-CM | POA: Diagnosis not present

## 2022-06-21 NOTE — Telephone Encounter (Signed)
     Patient  visit on 06/17/2022  at [facility] was for A-V fistula.  Have you been able to follow up with your primary care physician? Appointment scheduled 06/27/2022.  The patient was or was not able to obtain any needed medicine or equipment. No medication prescribed.  Are there diet recommendations that you are having difficulty following? No  Patient expresses understanding of discharge instructions and education provided has no other needs at this time.    [place] Resource Care Guide   [email or web address]  ?? [phone]   [email or web address]: [email or web address]  [email or web address]

## 2022-06-24 DIAGNOSIS — Z992 Dependence on renal dialysis: Secondary | ICD-10-CM | POA: Diagnosis not present

## 2022-06-24 DIAGNOSIS — N2581 Secondary hyperparathyroidism of renal origin: Secondary | ICD-10-CM | POA: Diagnosis not present

## 2022-06-24 DIAGNOSIS — N186 End stage renal disease: Secondary | ICD-10-CM | POA: Diagnosis not present

## 2022-06-25 ENCOUNTER — Other Ambulatory Visit (INDEPENDENT_AMBULATORY_CARE_PROVIDER_SITE_OTHER): Payer: Self-pay | Admitting: Vascular Surgery

## 2022-06-25 DIAGNOSIS — N186 End stage renal disease: Secondary | ICD-10-CM

## 2022-06-26 DIAGNOSIS — N2581 Secondary hyperparathyroidism of renal origin: Secondary | ICD-10-CM | POA: Diagnosis not present

## 2022-06-26 DIAGNOSIS — N186 End stage renal disease: Secondary | ICD-10-CM | POA: Diagnosis not present

## 2022-06-26 DIAGNOSIS — Z992 Dependence on renal dialysis: Secondary | ICD-10-CM | POA: Diagnosis not present

## 2022-06-27 ENCOUNTER — Encounter (INDEPENDENT_AMBULATORY_CARE_PROVIDER_SITE_OTHER): Payer: Self-pay | Admitting: Nurse Practitioner

## 2022-06-27 ENCOUNTER — Ambulatory Visit (INDEPENDENT_AMBULATORY_CARE_PROVIDER_SITE_OTHER): Payer: Medicare HMO | Admitting: Nurse Practitioner

## 2022-06-27 ENCOUNTER — Ambulatory Visit (INDEPENDENT_AMBULATORY_CARE_PROVIDER_SITE_OTHER): Payer: Medicare HMO

## 2022-06-27 VITALS — BP 197/103 | HR 114 | Resp 16 | Wt 154.6 lb

## 2022-06-27 DIAGNOSIS — E7800 Pure hypercholesterolemia, unspecified: Secondary | ICD-10-CM

## 2022-06-27 DIAGNOSIS — I10 Essential (primary) hypertension: Secondary | ICD-10-CM

## 2022-06-27 DIAGNOSIS — N186 End stage renal disease: Secondary | ICD-10-CM

## 2022-06-27 DIAGNOSIS — Z992 Dependence on renal dialysis: Secondary | ICD-10-CM | POA: Diagnosis not present

## 2022-06-27 DIAGNOSIS — N2581 Secondary hyperparathyroidism of renal origin: Secondary | ICD-10-CM | POA: Diagnosis not present

## 2022-06-28 DIAGNOSIS — Z992 Dependence on renal dialysis: Secondary | ICD-10-CM | POA: Diagnosis not present

## 2022-06-28 DIAGNOSIS — N186 End stage renal disease: Secondary | ICD-10-CM | POA: Diagnosis not present

## 2022-06-28 DIAGNOSIS — N2581 Secondary hyperparathyroidism of renal origin: Secondary | ICD-10-CM | POA: Diagnosis not present

## 2022-07-01 DIAGNOSIS — Z992 Dependence on renal dialysis: Secondary | ICD-10-CM | POA: Diagnosis not present

## 2022-07-01 DIAGNOSIS — N2581 Secondary hyperparathyroidism of renal origin: Secondary | ICD-10-CM | POA: Diagnosis not present

## 2022-07-01 DIAGNOSIS — N186 End stage renal disease: Secondary | ICD-10-CM | POA: Diagnosis not present

## 2022-07-03 DIAGNOSIS — N186 End stage renal disease: Secondary | ICD-10-CM | POA: Diagnosis not present

## 2022-07-03 DIAGNOSIS — N2581 Secondary hyperparathyroidism of renal origin: Secondary | ICD-10-CM | POA: Diagnosis not present

## 2022-07-03 DIAGNOSIS — Z992 Dependence on renal dialysis: Secondary | ICD-10-CM | POA: Diagnosis not present

## 2022-07-05 DIAGNOSIS — N2581 Secondary hyperparathyroidism of renal origin: Secondary | ICD-10-CM | POA: Diagnosis not present

## 2022-07-05 DIAGNOSIS — N186 End stage renal disease: Secondary | ICD-10-CM | POA: Diagnosis not present

## 2022-07-05 DIAGNOSIS — Z992 Dependence on renal dialysis: Secondary | ICD-10-CM | POA: Diagnosis not present

## 2022-07-08 DIAGNOSIS — N2581 Secondary hyperparathyroidism of renal origin: Secondary | ICD-10-CM | POA: Diagnosis not present

## 2022-07-08 DIAGNOSIS — N186 End stage renal disease: Secondary | ICD-10-CM | POA: Diagnosis not present

## 2022-07-08 DIAGNOSIS — Z992 Dependence on renal dialysis: Secondary | ICD-10-CM | POA: Diagnosis not present

## 2022-07-10 DIAGNOSIS — N2581 Secondary hyperparathyroidism of renal origin: Secondary | ICD-10-CM | POA: Diagnosis not present

## 2022-07-10 DIAGNOSIS — N186 End stage renal disease: Secondary | ICD-10-CM | POA: Diagnosis not present

## 2022-07-10 DIAGNOSIS — Z992 Dependence on renal dialysis: Secondary | ICD-10-CM | POA: Diagnosis not present

## 2022-07-12 DIAGNOSIS — N2581 Secondary hyperparathyroidism of renal origin: Secondary | ICD-10-CM | POA: Diagnosis not present

## 2022-07-12 DIAGNOSIS — N186 End stage renal disease: Secondary | ICD-10-CM | POA: Diagnosis not present

## 2022-07-12 DIAGNOSIS — Z992 Dependence on renal dialysis: Secondary | ICD-10-CM | POA: Diagnosis not present

## 2022-07-14 ENCOUNTER — Encounter (INDEPENDENT_AMBULATORY_CARE_PROVIDER_SITE_OTHER): Payer: Self-pay | Admitting: Nurse Practitioner

## 2022-07-14 NOTE — Progress Notes (Signed)
Subjective:    Patient ID: James Black, male    DOB: Jul 01, 1955, 67 y.o.   MRN: [id number] Chief Complaint  Patient presents with   Follow-up    [facility] 6 week with HDA    The patient returns to the office for follow up regarding a problem with their dialysis access.   The patient notes a significant increase in bleeding time after decannulation.  The patient has also been informed that there is increased recirculation.    The patient denies hand pain or other symptoms consistent with steal phenomena.  No significant arm swelling.  The patient denies redness or swelling at the access site. The patient denies fever or chills at home or while on dialysis.  No recent shortening of the patient's walking distance or new symptoms consistent with claudication.  No history of rest pain symptoms. No new ulcers or wounds of the lower extremities have occurred.  The patient denies amaurosis fugax or recent TIA symptoms. There are no recent neurological changes noted. There is no history of DVT, PE or superficial thrombophlebitis. No recent episodes of angina or shortness of breath documented.   Duplex ultrasound of the AV access shows a patent access.  The previously noted stenosis is significantly increased compared to last study.  Flow volume today is 3563 cc/min (previous flow volume was 2335 cc/min) there is a poststenotic turbulence into the axillary subclavian vein.    Review of Systems  Hematological:  Bruises/bleeds easily.  All other systems reviewed and are negative.      Objective:   Physical Exam Vitals reviewed.  HENT:     Head: Normocephalic.  Cardiovascular:     Rate and Rhythm: Normal rate.     Pulses: Normal pulses.  Pulmonary:     Effort: Pulmonary effort is normal.  Skin:    General: Skin is warm and dry.  Neurological:     Mental Status: He is alert and oriented to person, place, and time.  Psychiatric:        Mood and Affect: Mood normal.        Behavior:  Behavior normal.        Thought Content: Thought content normal.        Judgment: Judgment normal.     BP (!) 197/103 (BP Location: Right Arm)   Pulse (!) 114   Resp 16   Wt 154 lb 9.6 oz (70.1 kg)   BMI 27.39 kg/m   Past Medical History:  Diagnosis Date   Anemia    Arthritis    Cerebral palsy (HCC)    DDD (degenerative disc disease), lumbar    Deficiency of vitamin B12    Encephalitis    ESRD (end stage renal disease) on dialysis (HCC)    M-W-F   GERD (gastroesophageal reflux disease)    Hemorrhoid    Hypercholesterolemia    Hyperkalemia    Hypertension    PONV (postoperative nausea and vomiting)    n/v after 08-16-21 capd revision   Tremor    Tubular adenoma of colon    Vitamin D deficiency     Social History   Socioeconomic History   Marital status: Single    Spouse name: Not on file   Number of children: Not on file   Years of education: Not on file   Highest education level: Not on file  Occupational History   Not on file  Tobacco Use   Smoking status: Never    Passive exposure: Never  Smokeless tobacco: Never  Vaping Use   Vaping Use: Never used  Substance and Sexual Activity   Alcohol use: No   Drug use: No   Sexual activity: Not on file  Other Topics Concern   Not on file  Social History Narrative   Lives with niece   Social Determinants of Health   Financial Resource Strain: Not on file  Food Insecurity: No Food Insecurity (05/23/2022)   Hunger Vital Sign    Worried About Running Out of Food in the Last Year: Never true    Ran Out of Food in the Last Year: Never true  Transportation Needs: No Transportation Needs (05/23/2022)   PRAPARE - Hydrologist (Medical): No    Lack of Transportation (Non-Medical): No  Physical Activity: Not on file  Stress: Not on file  Social Connections: Not on file  Intimate Partner Violence: Not on file    Past Surgical History:  Procedure Laterality Date   A/V FISTULAGRAM  Left 05/16/2022   Procedure: A/V Fistulagram;  Surgeon: Algernon Huxley, MD;  Location: [place] [facility];  Service: Cardiovascular;  Laterality: Left;   AV FISTULA PLACEMENT Left 12/06/2021   Procedure: INSERTION OF ARTERIOVENOUS (AV) GORE-TEX GRAFT ARM (BRACHIAL AXILLARY);  Surgeon: Algernon Huxley, MD;  Location: [facility];  Service: Vascular;  Laterality: Left;   CAPD INSERTION N/A 08/10/2020   Procedure: LAPAROSCOPIC INSERTION CONTINUOUS AMBULATORY PERITONEAL DIALYSIS  (CAPD) CATHETER;  Surgeon: Jules Husbands, MD;  Location: [facility];  Service: General;  Laterality: N/A;   CAPD REMOVAL N/A 11/08/2021   Procedure: REMOVAL CONTINUOUS AMBULATORY PERITONEAL DIALYSIS  (CAPD) CATHETER;  Surgeon: Jules Husbands, MD;  Location: [facility];  Service: General;  Laterality: N/A;   CAPD REVISION N/A 08/16/2021   Procedure: LAPAROSCOPIC REVISION CONTINUOUS AMBULATORY PERITONEAL DIALYSIS  (CAPD) CATHETER;  Surgeon: Jules Husbands, MD;  Location: [facility];  Service: General;  Laterality: N/A;   COLONOSCOPY     2003, 2011   COLONOSCOPY WITH PROPOFOL N/A 05/25/2015   Procedure: COLONOSCOPY WITH PROPOFOL;  Surgeon: Manya Silvas, MD;  Location: [facility];  Service: Endoscopy;  Laterality: N/A;   COLONOSCOPY WITH PROPOFOL N/A 05/12/2020   Procedure: COLONOSCOPY WITH PROPOFOL;  Surgeon: Lesly Rubenstein, MD;  Location: [facility]OSCOPY;  Service: Endoscopy;  Laterality: N/A;   DIALYSIS/PERMA CATHETER REMOVAL N/A 02/07/2022   Procedure: DIALYSIS/PERMA CATHETER REMOVAL;  Surgeon: Algernon Huxley, MD;  Location: [place] [facility];  Service: Cardiovascular;  Laterality: N/A;   ESOPHAGOGASTRODUODENOSCOPY     2003, 2011   ESOPHAGOGASTRODUODENOSCOPY (EGD) WITH PROPOFOL N/A 05/12/2020   Procedure: ESOPHAGOGASTRODUODENOSCOPY (EGD) WITH PROPOFOL;  Surgeon: Lesly Rubenstein, MD;  Location: [facility];  Service: Endoscopy;  Laterality: N/A;   FRACTURE SURGERY Left 1997   arm   XI ROBOTIC ASSISTED INGUINAL HERNIA  REPAIR WITH MESH Right 08/23/2021   Procedure: XI ROBOTIC ASSISTED INGUINAL HERNIA REPAIR WITH MESH;  Surgeon: Jules Husbands, MD;  Location: [facility];  Service: General;  Laterality: Right;    History reviewed. No pertinent family history.  Allergies  Allergen Reactions   Cefuroxime Rash    TOLERATED CEFAZOLIN   Tramadol Nausea Only and Other (See Comments)    Dizziness       Latest Ref Rng & Units 06/17/2022    2:04 PM 12/15/2021    5:00 AM 12/14/2021    4:00 AM  CBC  WBC 4.0 - 10.5 K/uL 5.6  8.0  7.8  Hemoglobin 13.0 - 17.0 g/dL 10.5  11.1  10.7   Hematocrit 39.0 - 52.0 % 32.2  34.7  35.0   Platelets 150 - 400 K/uL 184  220  181       CMP     Component Value Date/Time   NA 133 (L) 06/17/2022 1404   K 3.3 (L) 06/17/2022 1404   CL 96 (L) 06/17/2022 1404   CO2 23 06/17/2022 1404   GLUCOSE 95 06/17/2022 1404   BUN 18 06/17/2022 1404   CREATININE 4.95 (H) 06/17/2022 1404   CALCIUM 8.8 (L) 06/17/2022 1404   PROT 7.0 06/17/2022 1404   ALBUMIN 4.1 06/17/2022 1404   AST 24 06/17/2022 1404   ALT 16 06/17/2022 1404   ALKPHOS 90 06/17/2022 1404   BILITOT 1.2 06/17/2022 1404   GFRNONAA 12 (L) 06/17/2022 1404     No results found.     Assessment & Plan:   1. ESRD (end stage renal disease) ([place]) Recommend:  The patient is experiencing increasing problems with their dialysis access.  Patient should have a fistulagram with the intention for intervention.  The intention for intervention is to restore appropriate flow and prevent thrombosis and possible loss of the access.  As well as improve the quality of dialysis therapy.  The risks, benefits and alternative therapies were reviewed in detail with the patient.  All questions were answered.  The patient agrees to proceed with angio/intervention.    The patient will follow up with me in the office after the procedure.  - VAS US DUPLEX DIALYSIS ACCESS (AVF, AVG)  2. Hypercholesterolemia Continue statin as ordered and  reviewed, no changes at this time  3. Benign essential hypertension Continue antihypertensive medications as already ordered, these medications have been reviewed and there are no changes at this time.   Current Outpatient Medications on File Prior to Visit  Medication Sig Dispense Refill   acetaminophen (TYLENOL) 325 MG tablet Take 650 mg by mouth every 6 (six) hours as needed for mild pain.     amLODipine (NORVASC) 5 MG tablet Take 5 mg by mouth every morning.     APIXABAN (ELIQUIS) VTE STARTER PACK (10MG AND 5MG) Take as directed on package: start with two-36m tablets twice daily for 7 days. (Already been on since 7/20.)  On day 7/27, switch to one-516mtablet twice daily. 1 each 0   atorvastatin (LIPITOR) 10 MG tablet Take 10 mg by mouth every morning.     calcium acetate (PHOSLO) 667 MG capsule Take 667 mg by mouth 3 (three) times daily.     Cholecalciferol 5000 units TABS Take 5,000 Units by mouth once a week.     dorzolamide-timolol (COSOPT) 22.3-6.8 MG/ML ophthalmic solution Place 1 drop into both eyes 2 (two) times daily.     fluticasone (FLONASE) 50 MCG/ACT nasal spray Place 2 sprays into both nostrils daily as needed for allergies or rhinitis.     furosemide (LASIX) 80 MG tablet Take 80 mg by mouth daily.     hydrALAZINE (APRESOLINE) 25 MG tablet Take 1 tablet (25 mg total) by mouth every 8 (eight) hours. 90 tablet 2   lactulose (CHRONULAC) 10 GM/15ML solution Take 20 g by mouth 2 (two) times daily.     lidocaine-prilocaine (EMLA) cream Apply topically daily.     losartan (COZAAR) 50 MG tablet Take 50 mg by mouth at bedtime.     metoprolol tartrate (LOPRESSOR) 25 MG tablet Take 25 mg by mouth every morning.  Netarsudil-Latanoprost (ROCKLATAN) 0.02-0.005 % SOLN Place 1 drop into both eyes every evening.     pantoprazole (PROTONIX) 20 MG tablet Take 20 mg by mouth every morning.     guaiFENesin-dextromethorphan (ROBITUSSIN DM) 100-10 MG/5ML syrup Take 5 mLs by mouth every 4 (four)  hours as needed for cough. (Patient not taking: Reported on 05/16/2022) 118 mL 0   HYDROcodone-acetaminophen (NORCO/VICODIN) 5-325 MG tablet Take 1-2 tablets by mouth every 6 (six) hours as needed for moderate pain. (Patient not taking: Reported on 05/02/2022) 12 tablet 0   No current facility-administered medications on file prior to visit.    There are no Patient Instructions on file for this visit. No follow-ups on file.   Kris Hartmann, NP

## 2022-07-15 ENCOUNTER — Telehealth (INDEPENDENT_AMBULATORY_CARE_PROVIDER_SITE_OTHER): Payer: Self-pay

## 2022-07-15 DIAGNOSIS — N186 End stage renal disease: Secondary | ICD-10-CM | POA: Diagnosis not present

## 2022-07-15 DIAGNOSIS — N2581 Secondary hyperparathyroidism of renal origin: Secondary | ICD-10-CM | POA: Diagnosis not present

## 2022-07-15 DIAGNOSIS — Z992 Dependence on renal dialysis: Secondary | ICD-10-CM | POA: Diagnosis not present

## 2022-07-15 NOTE — Telephone Encounter (Addendum)
Spoke with the patient's niece and he is scheduled with Dr. Lucky Cowboy on 08/01/22 for a lue fistulagram with a 6:45 am arrival time to the [facility]. Pre-procedure instructions were discussed and will be mailed. Patient's niece was offered 07/25/22 and declined.

## 2022-07-16 NOTE — Telephone Encounter (Signed)
Spoke with the patient's niece and let her know to have the [facility] at [facility] at 12:00 pm instead of 6:45 am.

## 2022-07-17 DIAGNOSIS — N186 End stage renal disease: Secondary | ICD-10-CM | POA: Diagnosis not present

## 2022-07-17 DIAGNOSIS — Z992 Dependence on renal dialysis: Secondary | ICD-10-CM | POA: Diagnosis not present

## 2022-07-17 DIAGNOSIS — N2581 Secondary hyperparathyroidism of renal origin: Secondary | ICD-10-CM | POA: Diagnosis not present

## 2022-07-19 DIAGNOSIS — Z992 Dependence on renal dialysis: Secondary | ICD-10-CM | POA: Diagnosis not present

## 2022-07-19 DIAGNOSIS — N2581 Secondary hyperparathyroidism of renal origin: Secondary | ICD-10-CM | POA: Diagnosis not present

## 2022-07-19 DIAGNOSIS — N186 End stage renal disease: Secondary | ICD-10-CM | POA: Diagnosis not present

## 2022-07-22 DIAGNOSIS — Z992 Dependence on renal dialysis: Secondary | ICD-10-CM | POA: Diagnosis not present

## 2022-07-22 DIAGNOSIS — N2581 Secondary hyperparathyroidism of renal origin: Secondary | ICD-10-CM | POA: Diagnosis not present

## 2022-07-22 DIAGNOSIS — Z79899 Other long term (current) drug therapy: Secondary | ICD-10-CM | POA: Diagnosis not present

## 2022-07-22 DIAGNOSIS — N186 End stage renal disease: Secondary | ICD-10-CM | POA: Diagnosis not present

## 2022-07-24 DIAGNOSIS — N2581 Secondary hyperparathyroidism of renal origin: Secondary | ICD-10-CM | POA: Diagnosis not present

## 2022-07-24 DIAGNOSIS — Z992 Dependence on renal dialysis: Secondary | ICD-10-CM | POA: Diagnosis not present

## 2022-07-24 DIAGNOSIS — N186 End stage renal disease: Secondary | ICD-10-CM | POA: Diagnosis not present

## 2022-07-25 DIAGNOSIS — Z0000 Encounter for general adult medical examination without abnormal findings: Secondary | ICD-10-CM | POA: Diagnosis not present

## 2022-07-25 DIAGNOSIS — Z79899 Other long term (current) drug therapy: Secondary | ICD-10-CM | POA: Diagnosis not present

## 2022-07-25 DIAGNOSIS — I120 Hypertensive chronic kidney disease with stage 5 chronic kidney disease or end stage renal disease: Secondary | ICD-10-CM | POA: Diagnosis not present

## 2022-07-25 DIAGNOSIS — N2581 Secondary hyperparathyroidism of renal origin: Secondary | ICD-10-CM | POA: Diagnosis not present

## 2022-07-25 DIAGNOSIS — E7800 Pure hypercholesterolemia, unspecified: Secondary | ICD-10-CM | POA: Diagnosis not present

## 2022-07-25 DIAGNOSIS — N186 End stage renal disease: Secondary | ICD-10-CM | POA: Diagnosis not present

## 2022-07-25 DIAGNOSIS — Z1331 Encounter for screening for depression: Secondary | ICD-10-CM | POA: Diagnosis not present

## 2022-07-25 DIAGNOSIS — G809 Cerebral palsy, unspecified: Secondary | ICD-10-CM | POA: Diagnosis not present

## 2022-07-25 DIAGNOSIS — Z992 Dependence on renal dialysis: Secondary | ICD-10-CM | POA: Diagnosis not present

## 2022-07-25 DIAGNOSIS — M1A30X0 Chronic gout due to renal impairment, unspecified site, without tophus (tophi): Secondary | ICD-10-CM | POA: Diagnosis not present

## 2022-07-25 DIAGNOSIS — K219 Gastro-esophageal reflux disease without esophagitis: Secondary | ICD-10-CM | POA: Diagnosis not present

## 2022-07-26 DIAGNOSIS — N2581 Secondary hyperparathyroidism of renal origin: Secondary | ICD-10-CM | POA: Diagnosis not present

## 2022-07-26 DIAGNOSIS — Z992 Dependence on renal dialysis: Secondary | ICD-10-CM | POA: Diagnosis not present

## 2022-07-26 DIAGNOSIS — N186 End stage renal disease: Secondary | ICD-10-CM | POA: Diagnosis not present

## 2022-07-29 DIAGNOSIS — Z992 Dependence on renal dialysis: Secondary | ICD-10-CM | POA: Diagnosis not present

## 2022-07-29 DIAGNOSIS — N2581 Secondary hyperparathyroidism of renal origin: Secondary | ICD-10-CM | POA: Diagnosis not present

## 2022-07-29 DIAGNOSIS — N186 End stage renal disease: Secondary | ICD-10-CM | POA: Diagnosis not present

## 2022-07-29 DIAGNOSIS — Z125 Encounter for screening for malignant neoplasm of prostate: Secondary | ICD-10-CM | POA: Diagnosis not present

## 2022-07-29 DIAGNOSIS — Z13220 Encounter for screening for lipoid disorders: Secondary | ICD-10-CM | POA: Diagnosis not present

## 2022-07-31 DIAGNOSIS — Z992 Dependence on renal dialysis: Secondary | ICD-10-CM | POA: Diagnosis not present

## 2022-07-31 DIAGNOSIS — N2581 Secondary hyperparathyroidism of renal origin: Secondary | ICD-10-CM | POA: Diagnosis not present

## 2022-07-31 DIAGNOSIS — N186 End stage renal disease: Secondary | ICD-10-CM | POA: Diagnosis not present

## 2022-08-01 ENCOUNTER — Encounter: Payer: Self-pay | Admitting: Vascular Surgery

## 2022-08-01 ENCOUNTER — Other Ambulatory Visit: Payer: Self-pay

## 2022-08-01 ENCOUNTER — Ambulatory Visit
Admission: RE | Admit: 2022-08-01 | Discharge: 2022-08-01 | Disposition: A | Discharge status: Home / Self Care | Payer: Medicare HMO | Attending: Vascular Surgery | Admitting: Vascular Surgery

## 2022-08-01 ENCOUNTER — Encounter
Admission: RE | Disposition: A | Discharge status: Home / Self Care | Payer: Self-pay | Source: Home / Self Care | Attending: Vascular Surgery

## 2022-08-01 DIAGNOSIS — Y841 Kidney dialysis as the cause of abnormal reaction of the patient, or of later complication, without mention of misadventure at the time of the procedure: Secondary | ICD-10-CM | POA: Insufficient documentation

## 2022-08-01 DIAGNOSIS — I120 Hypertensive chronic kidney disease with stage 5 chronic kidney disease or end stage renal disease: Secondary | ICD-10-CM | POA: Insufficient documentation

## 2022-08-01 DIAGNOSIS — N186 End stage renal disease: Secondary | ICD-10-CM

## 2022-08-01 DIAGNOSIS — T82858A Stenosis of vascular prosthetic devices, implants and grafts, initial encounter: Secondary | ICD-10-CM | POA: Diagnosis not present

## 2022-08-01 DIAGNOSIS — Z992 Dependence on renal dialysis: Secondary | ICD-10-CM | POA: Diagnosis not present

## 2022-08-01 HISTORY — PX: A/V FISTULAGRAM: CATH118298

## 2022-08-01 LAB — POTASSIUM (ARMC VASCULAR LAB ONLY): Potassium (ARMC vascular lab): 3.6 mmol/L (ref 3.5–5.1)

## 2022-08-01 SURGERY — A/V FISTULAGRAM
Anesthesia: Moderate Sedation | Laterality: Left

## 2022-08-01 MED ORDER — HEPARIN SODIUM (PORCINE) 1000 UNIT/ML IJ SOLN
INTRAMUSCULAR | Status: AC
  Filled 2022-08-01: qty 10

## 2022-08-01 MED ORDER — VANCOMYCIN HCL IN DEXTROSE 1-5 GM/200ML-% IV SOLN: 1000.0000 mg | INTRAVENOUS | Status: AC

## 2022-08-01 MED ORDER — IODIXANOL 320 MG/ML IV SOLN
INTRAVENOUS | Status: DC | PRN
  Administered 2022-08-01: 20 mL

## 2022-08-01 MED ORDER — DIPHENHYDRAMINE HCL 50 MG/ML IJ SOLN: 50.0000 mg | Freq: Once | INTRAMUSCULAR | Status: DC | PRN

## 2022-08-01 MED ORDER — VANCOMYCIN HCL IN DEXTROSE 1-5 GM/200ML-% IV SOLN
INTRAVENOUS | Status: AC
  Administered 2022-08-01: 1000 mg via INTRAVENOUS
  Filled 2022-08-01: qty 200

## 2022-08-01 MED ORDER — MIDAZOLAM HCL 5 MG/5ML IJ SOLN
INTRAMUSCULAR | Status: AC
  Filled 2022-08-01: qty 5

## 2022-08-01 MED ORDER — HYDROMORPHONE HCL 1 MG/ML IJ SOLN: 1.0000 mg | Freq: Once | INTRAMUSCULAR | Status: DC | PRN

## 2022-08-01 MED ORDER — HEPARIN SODIUM (PORCINE) 1000 UNIT/ML IJ SOLN
INTRAMUSCULAR | Status: DC | PRN
  Administered 2022-08-01: 3000 [IU] via INTRAVENOUS

## 2022-08-01 MED ORDER — SODIUM CHLORIDE 0.9 % IV SOLN: INTRAVENOUS | Status: DC

## 2022-08-01 MED ORDER — FENTANYL CITRATE (PF) 100 MCG/2ML IJ SOLN
INTRAMUSCULAR | Status: DC | PRN
  Administered 2022-08-01: 50 ug via INTRAVENOUS

## 2022-08-01 MED ORDER — ONDANSETRON HCL 4 MG/2ML IJ SOLN: 4.0000 mg | Freq: Four times a day (QID) | INTRAMUSCULAR | Status: DC | PRN

## 2022-08-01 MED ORDER — FENTANYL CITRATE (PF) 100 MCG/2ML IJ SOLN
INTRAMUSCULAR | Status: AC
  Filled 2022-08-01: qty 2

## 2022-08-01 MED ORDER — FAMOTIDINE 20 MG PO TABS: 40.0000 mg | ORAL_TABLET | Freq: Once | ORAL | Status: DC | PRN

## 2022-08-01 MED ORDER — MIDAZOLAM HCL 2 MG/ML PO SYRP: 8.0000 mg | ORAL_SOLUTION | Freq: Once | ORAL | Status: DC | PRN

## 2022-08-01 MED ORDER — MIDAZOLAM HCL 2 MG/2ML IJ SOLN
INTRAMUSCULAR | Status: DC | PRN
  Administered 2022-08-01: 2 mg via INTRAVENOUS

## 2022-08-01 MED ORDER — METHYLPREDNISOLONE SODIUM SUCC 125 MG IJ SOLR: 125.0000 mg | Freq: Once | INTRAMUSCULAR | Status: DC | PRN

## 2022-08-01 SURGICAL SUPPLY — 10 items
BALLN LUTONIX DCB 7X60X130 (BALLOONS) ×1
BALLOON LUTONIX DCB 7X60X130 (BALLOONS) IMPLANT
CANNULA 5F STIFF (CANNULA) IMPLANT
COVER PROBE ULTRASOUND 5X96 (MISCELLANEOUS) IMPLANT
DRAPE BRACHIAL (DRAPES) IMPLANT
KIT ENCORE 26 ADVANTAGE (KITS) IMPLANT
PACK ANGIOGRAPHY (CUSTOM PROCEDURE TRAY) ×1 IMPLANT
SHEATH BRITE TIP 6FRX5.5 (SHEATH) IMPLANT
SUT MNCRL AB 4-0 PS2 18 (SUTURE) IMPLANT
WIRE SUPRACORE 190CM (WIRE) IMPLANT

## 2022-08-01 NOTE — H&P (Signed)
[facility] Admission History & Physical  MRN : [id number]  James Black is a 67 y.o. (Dec 11, 1955) male who presents with chief complaint of No chief complaint on file. Marland Kitchen  History of Present Illness: I am asked to evaluate the patient by the dialysis center. The patient was sent here because they were unable to achieve adequate dialysis this morning. Furthermore the Center states there is very poor thrill and bruit. The patient states there there have been increasing problems with the access, such as "pulling clots" during dialysis and prolonged bleeding after decannulation. The patient estimates these problems have been going on for several weeks. The patient is unaware of any other change.   Patient denies pain or tenderness overlying the access.  There is no pain with dialysis.  The patient denies hand pain or finger pain consistent with steal syndrome.    There have been past interventions or declots of this access.  The patient is not chronically hypotensive on dialysis.  No current facility-administered medications for this encounter.    Past Medical History:  Diagnosis Date   Anemia    Arthritis    Cerebral palsy (HCC)    DDD (degenerative disc disease), lumbar    Deficiency of vitamin B12    Encephalitis    ESRD (end stage renal disease) on dialysis (HCC)    M-W-F   GERD (gastroesophageal reflux disease)    Hemorrhoid    Hypercholesterolemia    Hyperkalemia    Hypertension    PONV (postoperative nausea and vomiting)    n/v after 08-16-21 capd revision   Tremor    Tubular adenoma of colon    Vitamin D deficiency     Past Surgical History:  Procedure Laterality Date   A/V FISTULAGRAM Left 05/16/2022   Procedure: A/V Fistulagram;  Surgeon: Algernon Huxley, MD;  Location: [facility];  Service: Cardiovascular;  Laterality: Left;   AV FISTULA PLACEMENT Left 12/06/2021   Procedure: INSERTION OF ARTERIOVENOUS (AV) GORE-TEX GRAFT ARM (BRACHIAL  AXILLARY);  Surgeon: Algernon Huxley, MD;  Location: [facility];  Service: Vascular;  Laterality: Left;   CAPD INSERTION N/A 08/10/2020   Procedure: LAPAROSCOPIC INSERTION CONTINUOUS AMBULATORY PERITONEAL DIALYSIS  (CAPD) CATHETER;  Surgeon: Jules Husbands, MD;  Location: [facility];  Service: General;  Laterality: N/A;   CAPD REMOVAL N/A 11/08/2021   Procedure: REMOVAL CONTINUOUS AMBULATORY PERITONEAL DIALYSIS  (CAPD) CATHETER;  Surgeon: Jules Husbands, MD;  Location: [facility];  Service: General;  Laterality: N/A;   CAPD REVISION N/A 08/16/2021   Procedure: LAPAROSCOPIC REVISION CONTINUOUS AMBULATORY PERITONEAL DIALYSIS  (CAPD) CATHETER;  Surgeon: Jules Husbands, MD;  Location: [facility];  Service: General;  Laterality: N/A;   COLONOSCOPY     2003, 2011   COLONOSCOPY WITH PROPOFOL N/A 05/25/2015   Procedure: COLONOSCOPY WITH PROPOFOL;  Surgeon: Manya Silvas, MD;  Location: [facility];  Service: Endoscopy;  Laterality: N/A;   COLONOSCOPY WITH PROPOFOL N/A 05/12/2020   Procedure: COLONOSCOPY WITH PROPOFOL;  Surgeon: Lesly Rubenstein, MD;  Location: [facility] ENDOSCOPY;  Service: Endoscopy;  Laterality: N/A;   DIALYSIS/PERMA CATHETER REMOVAL N/A 02/07/2022   Procedure: DIALYSIS/PERMA CATHETER REMOVAL;  Surgeon: Algernon Huxley, MD;  Location: [place] [facility];  Service: Cardiovascular;  Laterality: N/A;   ESOPHAGOGASTRODUODENOSCOPY     2003, 2011   ESOPHAGOGASTRODUODENOSCOPY (EGD) WITH PROPOFOL N/A 05/12/2020   Procedure: ESOPHAGOGASTRODUODENOSCOPY (EGD) WITH PROPOFOL;  Surgeon: Lesly Rubenstein, MD;  Location: [facility];  Service: Endoscopy;  Laterality: N/A;   FRACTURE SURGERY Left 1997   arm   XI ROBOTIC ASSISTED INGUINAL HERNIA REPAIR WITH MESH Right 08/23/2021   Procedure: XI ROBOTIC ASSISTED INGUINAL HERNIA REPAIR WITH MESH;  Surgeon: Jules Husbands, MD;  Location: [facility];  Service: General;  Laterality: Right;     Social History   Tobacco Use   Smoking status: Never    Passive  exposure: Never   Smokeless tobacco: Never  Vaping Use   Vaping Use: Never used  Substance Use Topics   Alcohol use: No   Drug use: No    Family History  No family history of bleeding or clotting disorders, autoimmune disease or porphyria  Allergies  Allergen Reactions   Cefuroxime Rash    TOLERATED CEFAZOLIN   Tramadol Nausea Only and Other (See Comments)    Dizziness     REVIEW OF SYSTEMS (Negative unless checked)  Constitutional: '[]'$ Weight loss  '[]'$ Fever  '[]'$ Chills Cardiac: '[]'$ Chest pain   '[]'$ Chest pressure   '[]'$ Palpitations   '[]'$ Shortness of breath when laying flat   '[]'$ Shortness of breath at rest   '[x]'$ Shortness of breath with exertion. Vascular:  '[]'$ Pain in legs with walking   '[]'$ Pain in legs at rest   '[]'$ Pain in legs when laying flat   '[]'$ Claudication   '[]'$ Pain in feet when walking  '[]'$ Pain in feet at rest  '[]'$ Pain in feet when laying flat   '[]'$ History of DVT   '[]'$ Phlebitis   '[]'$ Swelling in legs   '[]'$ Varicose veins   '[]'$ Non-healing ulcers Pulmonary:   '[]'$ Uses home oxygen   '[]'$ Productive cough   '[]'$ Hemoptysis   '[]'$ Wheeze  '[]'$ COPD   '[]'$ Asthma Neurologic:  '[]'$ Dizziness  '[]'$ Blackouts   '[]'$ Seizures   '[]'$ History of stroke   '[]'$ History of TIA  '[]'$ Aphasia   '[]'$ Temporary blindness   '[]'$ Dysphagia   '[]'$ Weakness or numbness in arms   '[]'$ Weakness or numbness in legs Musculoskeletal:  '[x]'$ Arthritis   '[]'$ Joint swelling   '[x]'$ Joint pain   '[]'$ Low back pain Hematologic:  '[]'$ Easy bruising  '[]'$ Easy bleeding   '[]'$ Hypercoagulable state   '[x]'$ Anemic  '[]'$ Hepatitis Gastrointestinal:  '[]'$ Blood in stool   '[]'$ Vomiting blood  '[]'$ Gastroesophageal reflux/heartburn   '[]'$ Difficulty swallowing. Genitourinary:  '[x]'$ Chronic kidney disease   '[]'$ Difficult urination  '[]'$ Frequent urination  '[]'$ Burning with urination   '[]'$ Blood in urine Skin:  '[]'$ Rashes   '[]'$ Ulcers   '[]'$ Wounds Psychological:  '[]'$ History of anxiety   '[]'$  History of major depression.  Physical Examination  There were no vitals filed for this visit. There is no height or weight on file to calculate  BMI. Gen: WD/WN, NAD Head: [place]/AT, No temporalis wasting.  Ear/Nose/Throat: Hearing grossly intact, nares w/o erythema or drainage, oropharynx w/o Erythema/Exudate,  Eyes: Conjunctiva clear, sclera non-icteric Neck: Trachea midline.  No JVD.  Pulmonary:  Good air movement, respirations not labored, no use of accessory muscles.  Cardiac: RRR, normal S1, S2. Vascular:  Vessel Right Left  Radial Palpable Palpable   Musculoskeletal: M/S 5/5 throughout.  Extremities without ischemic changes.  No deformity or atrophy.  Neurologic: Sensation grossly intact in extremities.  Symmetrical.  Speech is fluent. Motor exam as listed above. Psychiatric: Judgment intact, Mood & affect appropriate for pt's clinical situation. Dermatologic: No rashes or ulcers noted.  No cellulitis or open wounds.    CBC Lab Results  Component Value Date   WBC 5.6 06/17/2022   HGB 10.5 (L) 06/17/2022   HCT 32.2 (L) 06/17/2022   MCV 90.4 06/17/2022   PLT 184 06/17/2022    BMET  Component Value Date/Time   NA 133 (L) 06/17/2022 1404   K 3.3 (L) 06/17/2022 1404   CL 96 (L) 06/17/2022 1404   CO2 23 06/17/2022 1404   GLUCOSE 95 06/17/2022 1404   BUN 18 06/17/2022 1404   CREATININE 4.95 (H) 06/17/2022 1404   CALCIUM 8.8 (L) 06/17/2022 1404   GFRNONAA 12 (L) 06/17/2022 1404   CrCl cannot be calculated (Patient's most recent lab result is older than the maximum 21 days allowed.).  COAG Lab Results  Component Value Date   INR 1.1 12/11/2021   INR 1.1 12/06/2021    Radiology No results found.  Assessment/Plan 1.  Complication dialysis device with dysfunction AV access:  Patient's left arm dialysis access is malfunctioning. The patient will undergo angiography and correction of any problems using interventional techniques with the hope of restoring function to the access.  The risks and benefits were described to the patient.  All questions were answered.  The patient agrees to proceed with angiography and  intervention. Potassium will be drawn to ensure that it is an appropriate level prior to performing intervention. 2.  End-stage renal disease requiring hemodialysis:  Patient will continue dialysis therapy without further interruption if a successful intervention is not achieved then a tunneled catheter will be placed. Dialysis has already been arranged. 3.  Hypertension:  Patient will continue medical management; nephrology is following no changes in oral medications.    [name], MD  08/01/2022 12:15 PM

## 2022-08-01 NOTE — Op Note (Signed)
[facility]    OPERATIVE NOTE   PROCEDURE: 1.  Left brachial artery to axillary vein arteriovenous graft cannulation under ultrasound guidance 2.  Left arm shuntogram 3.  Percutaneous transluminal angioplasty of the venous stenosis with 7 mm diameter by 6 cm length Lutonix drug-coated angioplasty balloon  PRE-OPERATIVE DIAGNOSIS: 1. ESRD 2. 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  The graft itself is widely patent.  The venous outflow beyond the venous anastomosis including the central venous circulation was widely patent.  SPECIMEN(S):  None  CONTRAST: 20 cc  FLUORO TIME: 1 minute  MODERATE CONSCIOUS SEDATION TIME:  Approximately 10 minutes using 2 mg of Versed and 50 mcg of Fentanyl  INDICATIONS: James Black is a 67 y.o. male who presents with malfunctioning left brachial artery to axillary vein arteriovenous graft.  The patient is scheduled for left arm shuntogram.  The patient is aware the risks include but are not limited to: bleeding, infection, thrombosis of the cannulated access, and possible anaphylactic reaction to the contrast.  The patient is aware of the risks of the procedure and elects to proceed forward.  DESCRIPTION: After full informed written consent was obtained, the patient was brought back to the angiography suite and placed supine upon the angiography table.  The patient was connected to monitoring equipment. Moderate conscious sedation was administered during a face to face encounter throughout the procedure with my supervision of the RN administering medicines and monitoring the patient's vital signs, pulse oximetry, telemetry and mental status throughout from the start of the  procedure until the patient was taken to the recovery room The left arm was prepped and draped in the standard fashion for a percutaneous access intervention.  Under ultrasound guidance, the left brachial artery to axillary vein arteriovenous graft was cannulated with a micropuncture needle under direct ultrasound guidance were it was patent and a permanent image was performed.  The microwire was advanced into the graft and the needle was exchanged for the a microsheath.  I then upsized to a 6 Fr Sheath and imaging was performed.  Hand injections were completed to image the access including the central venous system. This demonstrated 60 to 70% stenosis of the venous anastomosis to the axillary vein from hyperplasia.  The graft itself is widely patent.  The venous outflow beyond the venous anastomosis including the central venous circulation was widely patent.  Based on the images, this patient will need intervention to the venous anastomosis. I then gave the patient 3000 units of intravenous heparin.  I then crossed the stenosis with a Supracore wire.  Based on the imaging, a 7 mm x 6 cm  Lutonix drug coated angioplasty balloon was selected.  The balloon was centered around the stenosis and inflated to 10 ATM for 1 minute(s).  On completion imaging, a 10-15% residual stenosis was present.     Based on the completion imaging, no further intervention is necessary.  The wire and balloon were removed from the sheath.  A 4-0 Monocryl purse-string suture was sewn around the sheath.  The sheath was removed while tying down the suture.  A sterile bandage was applied to the puncture site.  COMPLICATIONS: None  CONDITION: Stable   [name]  08/01/2022 2:41 PM  This note was created with Dragon Medical transcription system. Any errors in dictation are purely unintentional.

## 2022-08-02 ENCOUNTER — Encounter: Payer: Self-pay | Admitting: Vascular Surgery

## 2022-08-02 DIAGNOSIS — N2581 Secondary hyperparathyroidism of renal origin: Secondary | ICD-10-CM | POA: Diagnosis not present

## 2022-08-02 DIAGNOSIS — Z992 Dependence on renal dialysis: Secondary | ICD-10-CM | POA: Diagnosis not present

## 2022-08-02 DIAGNOSIS — N186 End stage renal disease: Secondary | ICD-10-CM | POA: Diagnosis not present

## 2022-08-04 DIAGNOSIS — N186 End stage renal disease: Secondary | ICD-10-CM | POA: Diagnosis not present

## 2022-08-04 DIAGNOSIS — Z992 Dependence on renal dialysis: Secondary | ICD-10-CM | POA: Diagnosis not present

## 2022-08-04 DIAGNOSIS — N2581 Secondary hyperparathyroidism of renal origin: Secondary | ICD-10-CM | POA: Diagnosis not present

## 2022-08-05 DIAGNOSIS — Z992 Dependence on renal dialysis: Secondary | ICD-10-CM | POA: Diagnosis not present

## 2022-08-05 DIAGNOSIS — N2581 Secondary hyperparathyroidism of renal origin: Secondary | ICD-10-CM | POA: Diagnosis not present

## 2022-08-05 DIAGNOSIS — N186 End stage renal disease: Secondary | ICD-10-CM | POA: Diagnosis not present

## 2022-08-07 DIAGNOSIS — N186 End stage renal disease: Secondary | ICD-10-CM | POA: Diagnosis not present

## 2022-08-07 DIAGNOSIS — N2581 Secondary hyperparathyroidism of renal origin: Secondary | ICD-10-CM | POA: Diagnosis not present

## 2022-08-07 DIAGNOSIS — Z992 Dependence on renal dialysis: Secondary | ICD-10-CM | POA: Diagnosis not present

## 2022-08-09 DIAGNOSIS — Z992 Dependence on renal dialysis: Secondary | ICD-10-CM | POA: Diagnosis not present

## 2022-08-09 DIAGNOSIS — N2581 Secondary hyperparathyroidism of renal origin: Secondary | ICD-10-CM | POA: Diagnosis not present

## 2022-08-09 DIAGNOSIS — N186 End stage renal disease: Secondary | ICD-10-CM | POA: Diagnosis not present

## 2022-08-12 DIAGNOSIS — N2581 Secondary hyperparathyroidism of renal origin: Secondary | ICD-10-CM | POA: Diagnosis not present

## 2022-08-12 DIAGNOSIS — N186 End stage renal disease: Secondary | ICD-10-CM | POA: Diagnosis not present

## 2022-08-12 DIAGNOSIS — Z992 Dependence on renal dialysis: Secondary | ICD-10-CM | POA: Diagnosis not present

## 2022-08-14 DIAGNOSIS — Z992 Dependence on renal dialysis: Secondary | ICD-10-CM | POA: Diagnosis not present

## 2022-08-14 DIAGNOSIS — N186 End stage renal disease: Secondary | ICD-10-CM | POA: Diagnosis not present

## 2022-08-14 DIAGNOSIS — N2581 Secondary hyperparathyroidism of renal origin: Secondary | ICD-10-CM | POA: Diagnosis not present

## 2022-08-16 DIAGNOSIS — N186 End stage renal disease: Secondary | ICD-10-CM | POA: Diagnosis not present

## 2022-08-16 DIAGNOSIS — N2581 Secondary hyperparathyroidism of renal origin: Secondary | ICD-10-CM | POA: Diagnosis not present

## 2022-08-16 DIAGNOSIS — Z992 Dependence on renal dialysis: Secondary | ICD-10-CM | POA: Diagnosis not present

## 2022-08-19 DIAGNOSIS — N186 End stage renal disease: Secondary | ICD-10-CM | POA: Diagnosis not present

## 2022-08-19 DIAGNOSIS — Z992 Dependence on renal dialysis: Secondary | ICD-10-CM | POA: Diagnosis not present

## 2022-08-19 DIAGNOSIS — N2581 Secondary hyperparathyroidism of renal origin: Secondary | ICD-10-CM | POA: Diagnosis not present

## 2022-08-19 DIAGNOSIS — Z79899 Other long term (current) drug therapy: Secondary | ICD-10-CM | POA: Diagnosis not present

## 2022-08-21 DIAGNOSIS — N2581 Secondary hyperparathyroidism of renal origin: Secondary | ICD-10-CM | POA: Diagnosis not present

## 2022-08-21 DIAGNOSIS — N186 End stage renal disease: Secondary | ICD-10-CM | POA: Diagnosis not present

## 2022-08-21 DIAGNOSIS — Z992 Dependence on renal dialysis: Secondary | ICD-10-CM | POA: Diagnosis not present

## 2022-08-23 DIAGNOSIS — N2581 Secondary hyperparathyroidism of renal origin: Secondary | ICD-10-CM | POA: Diagnosis not present

## 2022-08-23 DIAGNOSIS — Z992 Dependence on renal dialysis: Secondary | ICD-10-CM | POA: Diagnosis not present

## 2022-08-23 DIAGNOSIS — N186 End stage renal disease: Secondary | ICD-10-CM | POA: Diagnosis not present

## 2022-08-25 DIAGNOSIS — Z992 Dependence on renal dialysis: Secondary | ICD-10-CM | POA: Diagnosis not present

## 2022-08-25 DIAGNOSIS — N186 End stage renal disease: Secondary | ICD-10-CM | POA: Diagnosis not present

## 2022-08-26 DIAGNOSIS — N2581 Secondary hyperparathyroidism of renal origin: Secondary | ICD-10-CM | POA: Diagnosis not present

## 2022-08-26 DIAGNOSIS — N186 End stage renal disease: Secondary | ICD-10-CM | POA: Diagnosis not present

## 2022-08-26 DIAGNOSIS — Z992 Dependence on renal dialysis: Secondary | ICD-10-CM | POA: Diagnosis not present

## 2022-08-27 DIAGNOSIS — H401133 Primary open-angle glaucoma, bilateral, severe stage: Secondary | ICD-10-CM | POA: Diagnosis not present

## 2022-08-27 DIAGNOSIS — H2513 Age-related nuclear cataract, bilateral: Secondary | ICD-10-CM | POA: Diagnosis not present

## 2022-08-28 DIAGNOSIS — Z992 Dependence on renal dialysis: Secondary | ICD-10-CM | POA: Diagnosis not present

## 2022-08-28 DIAGNOSIS — N2581 Secondary hyperparathyroidism of renal origin: Secondary | ICD-10-CM | POA: Diagnosis not present

## 2022-08-28 DIAGNOSIS — N186 End stage renal disease: Secondary | ICD-10-CM | POA: Diagnosis not present

## 2022-08-30 DIAGNOSIS — Z992 Dependence on renal dialysis: Secondary | ICD-10-CM | POA: Diagnosis not present

## 2022-08-30 DIAGNOSIS — N2581 Secondary hyperparathyroidism of renal origin: Secondary | ICD-10-CM | POA: Diagnosis not present

## 2022-08-30 DIAGNOSIS — N186 End stage renal disease: Secondary | ICD-10-CM | POA: Diagnosis not present

## 2022-09-02 DIAGNOSIS — Z992 Dependence on renal dialysis: Secondary | ICD-10-CM | POA: Diagnosis not present

## 2022-09-02 DIAGNOSIS — N186 End stage renal disease: Secondary | ICD-10-CM | POA: Diagnosis not present

## 2022-09-02 DIAGNOSIS — N2581 Secondary hyperparathyroidism of renal origin: Secondary | ICD-10-CM | POA: Diagnosis not present

## 2022-09-04 DIAGNOSIS — N2581 Secondary hyperparathyroidism of renal origin: Secondary | ICD-10-CM | POA: Diagnosis not present

## 2022-09-04 DIAGNOSIS — N186 End stage renal disease: Secondary | ICD-10-CM | POA: Diagnosis not present

## 2022-09-04 DIAGNOSIS — Z992 Dependence on renal dialysis: Secondary | ICD-10-CM | POA: Diagnosis not present

## 2022-09-06 DIAGNOSIS — N186 End stage renal disease: Secondary | ICD-10-CM | POA: Diagnosis not present

## 2022-09-06 DIAGNOSIS — N2581 Secondary hyperparathyroidism of renal origin: Secondary | ICD-10-CM | POA: Diagnosis not present

## 2022-09-06 DIAGNOSIS — Z992 Dependence on renal dialysis: Secondary | ICD-10-CM | POA: Diagnosis not present

## 2022-09-09 DIAGNOSIS — Z992 Dependence on renal dialysis: Secondary | ICD-10-CM | POA: Diagnosis not present

## 2022-09-09 DIAGNOSIS — N186 End stage renal disease: Secondary | ICD-10-CM | POA: Diagnosis not present

## 2022-09-09 DIAGNOSIS — N2581 Secondary hyperparathyroidism of renal origin: Secondary | ICD-10-CM | POA: Diagnosis not present

## 2022-09-11 DIAGNOSIS — N186 End stage renal disease: Secondary | ICD-10-CM | POA: Diagnosis not present

## 2022-09-11 DIAGNOSIS — Z992 Dependence on renal dialysis: Secondary | ICD-10-CM | POA: Diagnosis not present

## 2022-09-11 DIAGNOSIS — N2581 Secondary hyperparathyroidism of renal origin: Secondary | ICD-10-CM | POA: Diagnosis not present

## 2022-09-12 ENCOUNTER — Encounter (INDEPENDENT_AMBULATORY_CARE_PROVIDER_SITE_OTHER): Payer: Medicare HMO

## 2022-09-12 ENCOUNTER — Ambulatory Visit (INDEPENDENT_AMBULATORY_CARE_PROVIDER_SITE_OTHER): Payer: Medicare HMO | Admitting: Nurse Practitioner

## 2022-09-13 DIAGNOSIS — Z992 Dependence on renal dialysis: Secondary | ICD-10-CM | POA: Diagnosis not present

## 2022-09-13 DIAGNOSIS — N186 End stage renal disease: Secondary | ICD-10-CM | POA: Diagnosis not present

## 2022-09-13 DIAGNOSIS — N2581 Secondary hyperparathyroidism of renal origin: Secondary | ICD-10-CM | POA: Diagnosis not present

## 2022-09-16 DIAGNOSIS — Z131 Encounter for screening for diabetes mellitus: Secondary | ICD-10-CM | POA: Diagnosis not present

## 2022-09-16 DIAGNOSIS — Z992 Dependence on renal dialysis: Secondary | ICD-10-CM | POA: Diagnosis not present

## 2022-09-16 DIAGNOSIS — N186 End stage renal disease: Secondary | ICD-10-CM | POA: Diagnosis not present

## 2022-09-16 DIAGNOSIS — Z79899 Other long term (current) drug therapy: Secondary | ICD-10-CM | POA: Diagnosis not present

## 2022-09-16 DIAGNOSIS — N2581 Secondary hyperparathyroidism of renal origin: Secondary | ICD-10-CM | POA: Diagnosis not present

## 2022-09-18 DIAGNOSIS — Z992 Dependence on renal dialysis: Secondary | ICD-10-CM | POA: Diagnosis not present

## 2022-09-18 DIAGNOSIS — N186 End stage renal disease: Secondary | ICD-10-CM | POA: Diagnosis not present

## 2022-09-18 DIAGNOSIS — N2581 Secondary hyperparathyroidism of renal origin: Secondary | ICD-10-CM | POA: Diagnosis not present

## 2022-09-20 DIAGNOSIS — N2581 Secondary hyperparathyroidism of renal origin: Secondary | ICD-10-CM | POA: Diagnosis not present

## 2022-09-20 DIAGNOSIS — N186 End stage renal disease: Secondary | ICD-10-CM | POA: Diagnosis not present

## 2022-09-20 DIAGNOSIS — Z992 Dependence on renal dialysis: Secondary | ICD-10-CM | POA: Diagnosis not present

## 2022-09-23 DIAGNOSIS — N186 End stage renal disease: Secondary | ICD-10-CM | POA: Diagnosis not present

## 2022-09-23 DIAGNOSIS — N2581 Secondary hyperparathyroidism of renal origin: Secondary | ICD-10-CM | POA: Diagnosis not present

## 2022-09-23 DIAGNOSIS — Z992 Dependence on renal dialysis: Secondary | ICD-10-CM | POA: Diagnosis not present

## 2022-09-24 DIAGNOSIS — Z992 Dependence on renal dialysis: Secondary | ICD-10-CM | POA: Diagnosis not present

## 2022-09-24 DIAGNOSIS — N186 End stage renal disease: Secondary | ICD-10-CM | POA: Diagnosis not present

## 2022-09-25 DIAGNOSIS — N186 End stage renal disease: Secondary | ICD-10-CM | POA: Diagnosis not present

## 2022-09-25 DIAGNOSIS — N2581 Secondary hyperparathyroidism of renal origin: Secondary | ICD-10-CM | POA: Diagnosis not present

## 2022-09-25 DIAGNOSIS — Z992 Dependence on renal dialysis: Secondary | ICD-10-CM | POA: Diagnosis not present

## 2022-09-27 DIAGNOSIS — Z992 Dependence on renal dialysis: Secondary | ICD-10-CM | POA: Diagnosis not present

## 2022-09-27 DIAGNOSIS — N2581 Secondary hyperparathyroidism of renal origin: Secondary | ICD-10-CM | POA: Diagnosis not present

## 2022-09-27 DIAGNOSIS — N186 End stage renal disease: Secondary | ICD-10-CM | POA: Diagnosis not present

## 2022-09-30 DIAGNOSIS — N186 End stage renal disease: Secondary | ICD-10-CM | POA: Diagnosis not present

## 2022-09-30 DIAGNOSIS — Z992 Dependence on renal dialysis: Secondary | ICD-10-CM | POA: Diagnosis not present

## 2022-09-30 DIAGNOSIS — N2581 Secondary hyperparathyroidism of renal origin: Secondary | ICD-10-CM | POA: Diagnosis not present

## 2022-10-02 DIAGNOSIS — N186 End stage renal disease: Secondary | ICD-10-CM | POA: Diagnosis not present

## 2022-10-02 DIAGNOSIS — N2581 Secondary hyperparathyroidism of renal origin: Secondary | ICD-10-CM | POA: Diagnosis not present

## 2022-10-02 DIAGNOSIS — Z992 Dependence on renal dialysis: Secondary | ICD-10-CM | POA: Diagnosis not present

## 2022-10-04 DIAGNOSIS — N186 End stage renal disease: Secondary | ICD-10-CM | POA: Diagnosis not present

## 2022-10-04 DIAGNOSIS — N2581 Secondary hyperparathyroidism of renal origin: Secondary | ICD-10-CM | POA: Diagnosis not present

## 2022-10-04 DIAGNOSIS — Z992 Dependence on renal dialysis: Secondary | ICD-10-CM | POA: Diagnosis not present

## 2022-10-07 DIAGNOSIS — Z992 Dependence on renal dialysis: Secondary | ICD-10-CM | POA: Diagnosis not present

## 2022-10-07 DIAGNOSIS — N2581 Secondary hyperparathyroidism of renal origin: Secondary | ICD-10-CM | POA: Diagnosis not present

## 2022-10-07 DIAGNOSIS — N186 End stage renal disease: Secondary | ICD-10-CM | POA: Diagnosis not present

## 2022-10-08 ENCOUNTER — Other Ambulatory Visit (INDEPENDENT_AMBULATORY_CARE_PROVIDER_SITE_OTHER): Payer: Self-pay | Admitting: Vascular Surgery

## 2022-10-08 DIAGNOSIS — N186 End stage renal disease: Secondary | ICD-10-CM

## 2022-10-09 DIAGNOSIS — N186 End stage renal disease: Secondary | ICD-10-CM | POA: Diagnosis not present

## 2022-10-09 DIAGNOSIS — N2581 Secondary hyperparathyroidism of renal origin: Secondary | ICD-10-CM | POA: Diagnosis not present

## 2022-10-09 DIAGNOSIS — Z992 Dependence on renal dialysis: Secondary | ICD-10-CM | POA: Diagnosis not present

## 2022-10-10 ENCOUNTER — Ambulatory Visit (INDEPENDENT_AMBULATORY_CARE_PROVIDER_SITE_OTHER): Payer: Medicare HMO

## 2022-10-10 ENCOUNTER — Ambulatory Visit (INDEPENDENT_AMBULATORY_CARE_PROVIDER_SITE_OTHER): Payer: Medicare HMO | Admitting: Nurse Practitioner

## 2022-10-10 ENCOUNTER — Encounter (INDEPENDENT_AMBULATORY_CARE_PROVIDER_SITE_OTHER): Payer: Self-pay | Admitting: Nurse Practitioner

## 2022-10-10 VITALS — BP 175/94 | HR 82 | Resp 18 | Ht 63.5 in | Wt 149.8 lb

## 2022-10-10 DIAGNOSIS — I10 Essential (primary) hypertension: Secondary | ICD-10-CM | POA: Diagnosis not present

## 2022-10-10 DIAGNOSIS — E7800 Pure hypercholesterolemia, unspecified: Secondary | ICD-10-CM | POA: Diagnosis not present

## 2022-10-10 DIAGNOSIS — N186 End stage renal disease: Secondary | ICD-10-CM | POA: Diagnosis not present

## 2022-10-11 ENCOUNTER — Encounter (INDEPENDENT_AMBULATORY_CARE_PROVIDER_SITE_OTHER): Payer: Self-pay | Admitting: Nurse Practitioner

## 2022-10-11 DIAGNOSIS — N186 End stage renal disease: Secondary | ICD-10-CM | POA: Diagnosis not present

## 2022-10-11 DIAGNOSIS — Z992 Dependence on renal dialysis: Secondary | ICD-10-CM | POA: Diagnosis not present

## 2022-10-11 DIAGNOSIS — N2581 Secondary hyperparathyroidism of renal origin: Secondary | ICD-10-CM | POA: Diagnosis not present

## 2022-10-11 NOTE — Progress Notes (Signed)
Subjective:    Patient ID: James Black, male    DOB: [birth year], 67 y.o.   MRN: [id number] Chief Complaint  Patient presents with   Follow-up    Procedure f/u in 6 week with HDA -    The patient returns to the office for followup status post intervention of their dialysis access left brachial ax graft on 08/01/2022.   Following the intervention the access function has significantly improved, with better flow rates and improved KT/V. The patient has not been experiencing increased bleeding times following decannulation and the patient denies increased recirculation. The patient denies an increase in arm swelling. At the present time the patient denies hand pain.  No recent shortening of the patient's walking distance or new symptoms consistent with claudication.  No history of rest pain symptoms. No new ulcers or wounds of the lower extremities have occurred.  The patient denies amaurosis fugax or recent TIA symptoms. There are no recent neurological changes noted. There is no history of DVT, PE or superficial thrombophlebitis. No recent episodes of angina or shortness of breath documented.   Duplex ultrasound of the AV access shows a patent access.  The previously noted stenosis is improved compared to last study.  Flow volume today is 1941 cc/min (previous flow volume was 3563 cc/min)       Review of Systems  All other systems reviewed and are negative.      Objective:   Physical Exam Vitals reviewed.  HENT:     Head: Normocephalic.  Cardiovascular:     Rate and Rhythm: Normal rate.     Pulses:          Radial pulses are 2+ on the right side and 2+ on the left side.  Pulmonary:     Effort: Pulmonary effort is normal.  Skin:    General: Skin is warm and dry.  Neurological:     Mental Status: He is alert and oriented to person, place, and time.  Psychiatric:        Mood and Affect: Mood normal.        Behavior: Behavior normal.        Thought Content: Thought content  normal.        Judgment: Judgment normal.     BP (!) 175/94 (BP Location: Right Arm)   Pulse 82   Resp 18   Ht 5' 3.5" (1.613 m)   Wt 149 lb 12.8 oz (67.9 kg)   BMI 26.12 kg/m   Past Medical History:  Diagnosis Date   Anemia    Arthritis    Cerebral palsy (HCC)    DDD (degenerative disc disease), lumbar    Deficiency of vitamin B12    Encephalitis    ESRD (end stage renal disease) on dialysis (HCC)    M-W-F   GERD (gastroesophageal reflux disease)    Hemorrhoid    Hypercholesterolemia    Hyperkalemia    Hypertension    PONV (postoperative nausea and vomiting)    n/v after 08-16-21 capd revision   Tremor    Tubular adenoma of colon    Vitamin D deficiency     Social History   Socioeconomic History   Marital status: Single    Spouse name: Not on file   Number of children: Not on file   Years of education: Not on file   Highest education level: Not on file  Occupational History   Not on file  Tobacco Use   Smoking status: Never  Passive exposure: Never   Smokeless tobacco: Never  Vaping Use   Vaping Use: Never used  Substance and Sexual Activity   Alcohol use: No   Drug use: No   Sexual activity: Not on file  Other Topics Concern   Not on file  Social History Narrative   Lives with niece   Social Determinants of Health   Financial Resource Strain: Not on file  Food Insecurity: No Food Insecurity (05/23/2022)   Hunger Vital Sign    Worried About Running Out of Food in the Last Year: Never true    Ran Out of Food in the Last Year: Never true  Transportation Needs: No Transportation Needs (05/23/2022)   PRAPARE - Administrator, Civil Service (Medical): No    Lack of Transportation (Non-Medical): No  Physical Activity: Not on file  Stress: Not on file  Social Connections: Not on file  Intimate Partner Violence: Not on file    Past Surgical History:  Procedure Laterality Date   A/V FISTULAGRAM Left 05/16/2022   Procedure: A/V  Fistulagram;  Surgeon: Annice Needy, MD;  Location: [facility];  Service: Cardiovascular;  Laterality: Left;   A/V FISTULAGRAM Left 08/01/2022   Procedure: A/V Fistulagram;  Surgeon: Annice Needy, MD;  Location: [facility];  Service: Cardiovascular;  Laterality: Left;   AV FISTULA PLACEMENT Left 12/06/2021   Procedure: INSERTION OF ARTERIOVENOUS (AV) GORE-TEX GRAFT ARM (BRACHIAL AXILLARY);  Surgeon: Annice Needy, MD;  Location: [facility];  Service: Vascular;  Laterality: Left;   CAPD INSERTION N/A 08/10/2020   Procedure: LAPAROSCOPIC INSERTION CONTINUOUS AMBULATORY PERITONEAL DIALYSIS  (CAPD) CATHETER;  Surgeon: Leafy Ro, MD;  Location: [facility];  Service: General;  Laterality: N/A;   CAPD REMOVAL N/A 11/08/2021   Procedure: REMOVAL CONTINUOUS AMBULATORY PERITONEAL DIALYSIS  (CAPD) CATHETER;  Surgeon: Leafy Ro, MD;  Location: [facility];  Service: General;  Laterality: N/A;   CAPD REVISION N/A 08/16/2021   Procedure: LAPAROSCOPIC REVISION CONTINUOUS AMBULATORY PERITONEAL DIALYSIS  (CAPD) CATHETER;  Surgeon: Leafy Ro, MD;  Location: [facility];  Service: General;  Laterality: N/A;   COLONOSCOPY     2003, 2011   COLONOSCOPY WITH PROPOFOL N/A 05/25/2015   Procedure: COLONOSCOPY WITH PROPOFOL;  Surgeon: Scot Jun, MD;  Location: [facility];  Service: Endoscopy;  Laterality: N/A;   COLONOSCOPY WITH PROPOFOL N/A 05/12/2020   Procedure: COLONOSCOPY WITH PROPOFOL;  Surgeon: Regis Bill, MD;  Location: [facility];  Service: Endoscopy;  Laterality: N/A;   DIALYSIS/PERMA CATHETER REMOVAL N/A 02/07/2022   Procedure: DIALYSIS/PERMA CATHETER REMOVAL;  Surgeon: Annice Needy, MD;  Location: [facility];  Service: Cardiovascular;  Laterality: N/A;   ESOPHAGOGASTRODUODENOSCOPY     2003, 2011   ESOPHAGOGASTRODUODENOSCOPY (EGD) WITH PROPOFOL N/A 05/12/2020   Procedure: ESOPHAGOGASTRODUODENOSCOPY (EGD) WITH PROPOFOL;  Surgeon: Regis Bill, MD;   Location: [facility];  Service: Endoscopy;  Laterality: N/A;   FRACTURE SURGERY Left 1997   arm   XI ROBOTIC ASSISTED INGUINAL HERNIA REPAIR WITH MESH Right 08/23/2021   Procedure: XI ROBOTIC ASSISTED INGUINAL HERNIA REPAIR WITH MESH;  Surgeon: Leafy Ro, MD;  Location: [facility];  Service: General;  Laterality: Right;    History reviewed. No pertinent family history.  Allergies  Allergen Reactions   Cefuroxime Rash    TOLERATED CEFAZOLIN   Tramadol Nausea Only and Other (See Comments)    Dizziness       Latest Ref Rng &  Units 06/17/2022    2:04 PM 12/15/2021    5:00 AM 12/14/2021    4:00 AM  CBC  WBC 4.0 - 10.5 K/uL 5.6  8.0  7.8   Hemoglobin 13.0 - 17.0 g/dL 16.1  09.6  04.5   Hematocrit 39.0 - 52.0 % 32.2  34.7  35.0   Platelets 150 - 400 K/uL 184  220  181       CMP     Component Value Date/Time   NA 133 (L) 06/17/2022 1404   K 3.3 (L) 06/17/2022 1404   CL 96 (L) 06/17/2022 1404   CO2 23 06/17/2022 1404   GLUCOSE 95 06/17/2022 1404   BUN 18 06/17/2022 1404   CREATININE 4.95 (H) 06/17/2022 1404   CALCIUM 8.8 (L) 06/17/2022 1404   PROT 7.0 06/17/2022 1404   ALBUMIN 4.1 06/17/2022 1404   AST 24 06/17/2022 1404   ALT 16 06/17/2022 1404   ALKPHOS 90 06/17/2022 1404   BILITOT 1.2 06/17/2022 1404   GFRNONAA 12 (L) 06/17/2022 1404     No results found.     Assessment & Plan:   1. ESRD (end stage renal disease) (HCC) Recommend:  The patient is doing well and currently has adequate dialysis access. The patient's dialysis center is not reporting any access issues. Flow pattern is stable when compared to the prior ultrasound.  The patient should have a duplex ultrasound of the dialysis access in 6 months. The patient will follow-up with me in the office after each ultrasound    2. Hypercholesterolemia Continue statin as ordered and reviewed, no changes at this time  3. Benign essential hypertension Continue antihypertensive medications as already  ordered, these medications have been reviewed and there are no changes at this time.   Current Outpatient Medications on File Prior to Visit  Medication Sig Dispense Refill   acetaminophen (TYLENOL) 325 MG tablet Take 650 mg by mouth every 6 (six) hours as needed for mild pain.     amLODipine (NORVASC) 5 MG tablet Take 5 mg by mouth every morning.     atorvastatin (LIPITOR) 10 MG tablet Take 10 mg by mouth every morning.     calcium acetate (PHOSLO) 667 MG capsule Take 667 mg by mouth 3 (three) times daily.     Cholecalciferol 5000 units TABS Take 5,000 Units by mouth once a week.     dorzolamide-timolol (COSOPT) 22.3-6.8 MG/ML ophthalmic solution Place 1 drop into both eyes 2 (two) times daily.     fluticasone (FLONASE) 50 MCG/ACT nasal spray Place 2 sprays into both nostrils daily as needed for allergies or rhinitis.     furosemide (LASIX) 80 MG tablet Take 80 mg by mouth daily.     hydrALAZINE (APRESOLINE) 25 MG tablet Take 1 tablet (25 mg total) by mouth every 8 (eight) hours. 90 tablet 2   lactulose (CHRONULAC) 10 GM/15ML solution Take 20 g by mouth 2 (two) times daily.     lidocaine-prilocaine (EMLA) cream Apply topically daily.     loratadine (CLARITIN) 10 MG tablet Take 10 mg by mouth daily.     losartan (COZAAR) 50 MG tablet Take 50 mg by mouth at bedtime.     metoprolol tartrate (LOPRESSOR) 25 MG tablet Take 25 mg by mouth every morning.     Netarsudil-Latanoprost (ROCKLATAN) 0.02-0.005 % SOLN Place 1 drop into both eyes every evening.     pantoprazole (PROTONIX) 20 MG tablet Take 20 mg by mouth every morning.     APIXABAN (ELIQUIS)  VTE STARTER PACK (10MG  AND 5MG ) Take as directed on package: start with two-5mg  tablets twice daily for 7 days. (Already been on since 7/20.)  On day 7/27, switch to one-5mg  tablet twice daily. (Patient not taking: Reported on 08/01/2022) 1 each 0   guaiFENesin-dextromethorphan (ROBITUSSIN DM) 100-10 MG/5ML syrup Take 5 mLs by mouth every 4 (four) hours as  needed for cough. (Patient not taking: Reported on 05/16/2022) 118 mL 0   HYDROcodone-acetaminophen (NORCO/VICODIN) 5-325 MG tablet Take 1-2 tablets by mouth every 6 (six) hours as needed for moderate pain. (Patient not taking: Reported on 05/02/2022) 12 tablet 0   No current facility-administered medications on file prior to visit.    There are no Patient Instructions on file for this visit. No follow-ups on file.   Georgiana Spinner, NP

## 2022-10-14 DIAGNOSIS — Z992 Dependence on renal dialysis: Secondary | ICD-10-CM | POA: Diagnosis not present

## 2022-10-14 DIAGNOSIS — N2581 Secondary hyperparathyroidism of renal origin: Secondary | ICD-10-CM | POA: Diagnosis not present

## 2022-10-14 DIAGNOSIS — N186 End stage renal disease: Secondary | ICD-10-CM | POA: Diagnosis not present

## 2022-10-16 DIAGNOSIS — Z992 Dependence on renal dialysis: Secondary | ICD-10-CM | POA: Diagnosis not present

## 2022-10-16 DIAGNOSIS — N186 End stage renal disease: Secondary | ICD-10-CM | POA: Diagnosis not present

## 2022-10-16 DIAGNOSIS — N2581 Secondary hyperparathyroidism of renal origin: Secondary | ICD-10-CM | POA: Diagnosis not present

## 2022-10-18 DIAGNOSIS — N2581 Secondary hyperparathyroidism of renal origin: Secondary | ICD-10-CM | POA: Diagnosis not present

## 2022-10-18 DIAGNOSIS — Z992 Dependence on renal dialysis: Secondary | ICD-10-CM | POA: Diagnosis not present

## 2022-10-18 DIAGNOSIS — N186 End stage renal disease: Secondary | ICD-10-CM | POA: Diagnosis not present

## 2022-10-21 DIAGNOSIS — N2581 Secondary hyperparathyroidism of renal origin: Secondary | ICD-10-CM | POA: Diagnosis not present

## 2022-10-21 DIAGNOSIS — N186 End stage renal disease: Secondary | ICD-10-CM | POA: Diagnosis not present

## 2022-10-21 DIAGNOSIS — Z992 Dependence on renal dialysis: Secondary | ICD-10-CM | POA: Diagnosis not present

## 2022-10-23 DIAGNOSIS — N186 End stage renal disease: Secondary | ICD-10-CM | POA: Diagnosis not present

## 2022-10-23 DIAGNOSIS — Z79899 Other long term (current) drug therapy: Secondary | ICD-10-CM | POA: Diagnosis not present

## 2022-10-23 DIAGNOSIS — Z992 Dependence on renal dialysis: Secondary | ICD-10-CM | POA: Diagnosis not present

## 2022-10-23 DIAGNOSIS — N2581 Secondary hyperparathyroidism of renal origin: Secondary | ICD-10-CM | POA: Diagnosis not present

## 2022-10-25 DIAGNOSIS — N2581 Secondary hyperparathyroidism of renal origin: Secondary | ICD-10-CM | POA: Diagnosis not present

## 2022-10-25 DIAGNOSIS — N186 End stage renal disease: Secondary | ICD-10-CM | POA: Diagnosis not present

## 2022-10-25 DIAGNOSIS — Z992 Dependence on renal dialysis: Secondary | ICD-10-CM | POA: Diagnosis not present

## 2022-10-26 DIAGNOSIS — N186 End stage renal disease: Secondary | ICD-10-CM | POA: Diagnosis not present

## 2022-10-26 DIAGNOSIS — N2581 Secondary hyperparathyroidism of renal origin: Secondary | ICD-10-CM | POA: Diagnosis not present

## 2022-10-26 DIAGNOSIS — Z992 Dependence on renal dialysis: Secondary | ICD-10-CM | POA: Diagnosis not present

## 2022-10-28 DIAGNOSIS — N186 End stage renal disease: Secondary | ICD-10-CM | POA: Diagnosis not present

## 2022-10-28 DIAGNOSIS — N2581 Secondary hyperparathyroidism of renal origin: Secondary | ICD-10-CM | POA: Diagnosis not present

## 2022-10-28 DIAGNOSIS — Z992 Dependence on renal dialysis: Secondary | ICD-10-CM | POA: Diagnosis not present

## 2022-10-30 DIAGNOSIS — N186 End stage renal disease: Secondary | ICD-10-CM | POA: Diagnosis not present

## 2022-10-30 DIAGNOSIS — Z992 Dependence on renal dialysis: Secondary | ICD-10-CM | POA: Diagnosis not present

## 2022-10-30 DIAGNOSIS — N2581 Secondary hyperparathyroidism of renal origin: Secondary | ICD-10-CM | POA: Diagnosis not present

## 2022-10-31 DIAGNOSIS — H2513 Age-related nuclear cataract, bilateral: Secondary | ICD-10-CM | POA: Diagnosis not present

## 2022-10-31 DIAGNOSIS — H401133 Primary open-angle glaucoma, bilateral, severe stage: Secondary | ICD-10-CM | POA: Diagnosis not present

## 2022-11-01 DIAGNOSIS — N186 End stage renal disease: Secondary | ICD-10-CM | POA: Diagnosis not present

## 2022-11-01 DIAGNOSIS — Z992 Dependence on renal dialysis: Secondary | ICD-10-CM | POA: Diagnosis not present

## 2022-11-01 DIAGNOSIS — N2581 Secondary hyperparathyroidism of renal origin: Secondary | ICD-10-CM | POA: Diagnosis not present

## 2022-11-02 DIAGNOSIS — N2581 Secondary hyperparathyroidism of renal origin: Secondary | ICD-10-CM | POA: Diagnosis not present

## 2022-11-02 DIAGNOSIS — Z992 Dependence on renal dialysis: Secondary | ICD-10-CM | POA: Diagnosis not present

## 2022-11-02 DIAGNOSIS — N186 End stage renal disease: Secondary | ICD-10-CM | POA: Diagnosis not present

## 2022-11-04 DIAGNOSIS — Z992 Dependence on renal dialysis: Secondary | ICD-10-CM | POA: Diagnosis not present

## 2022-11-04 DIAGNOSIS — N186 End stage renal disease: Secondary | ICD-10-CM | POA: Diagnosis not present

## 2022-11-04 DIAGNOSIS — N2581 Secondary hyperparathyroidism of renal origin: Secondary | ICD-10-CM | POA: Diagnosis not present

## 2022-11-06 DIAGNOSIS — Z992 Dependence on renal dialysis: Secondary | ICD-10-CM | POA: Diagnosis not present

## 2022-11-06 DIAGNOSIS — N2581 Secondary hyperparathyroidism of renal origin: Secondary | ICD-10-CM | POA: Diagnosis not present

## 2022-11-06 DIAGNOSIS — N186 End stage renal disease: Secondary | ICD-10-CM | POA: Diagnosis not present

## 2022-11-08 DIAGNOSIS — N2581 Secondary hyperparathyroidism of renal origin: Secondary | ICD-10-CM | POA: Diagnosis not present

## 2022-11-08 DIAGNOSIS — N186 End stage renal disease: Secondary | ICD-10-CM | POA: Diagnosis not present

## 2022-11-08 DIAGNOSIS — Z992 Dependence on renal dialysis: Secondary | ICD-10-CM | POA: Diagnosis not present

## 2022-11-11 DIAGNOSIS — N2581 Secondary hyperparathyroidism of renal origin: Secondary | ICD-10-CM | POA: Diagnosis not present

## 2022-11-11 DIAGNOSIS — Z992 Dependence on renal dialysis: Secondary | ICD-10-CM | POA: Diagnosis not present

## 2022-11-11 DIAGNOSIS — N186 End stage renal disease: Secondary | ICD-10-CM | POA: Diagnosis not present

## 2022-11-13 DIAGNOSIS — N186 End stage renal disease: Secondary | ICD-10-CM | POA: Diagnosis not present

## 2022-11-13 DIAGNOSIS — N2581 Secondary hyperparathyroidism of renal origin: Secondary | ICD-10-CM | POA: Diagnosis not present

## 2022-11-13 DIAGNOSIS — Z992 Dependence on renal dialysis: Secondary | ICD-10-CM | POA: Diagnosis not present

## 2022-11-15 DIAGNOSIS — N2581 Secondary hyperparathyroidism of renal origin: Secondary | ICD-10-CM | POA: Diagnosis not present

## 2022-11-15 DIAGNOSIS — Z992 Dependence on renal dialysis: Secondary | ICD-10-CM | POA: Diagnosis not present

## 2022-11-15 DIAGNOSIS — N186 End stage renal disease: Secondary | ICD-10-CM | POA: Diagnosis not present

## 2022-11-18 DIAGNOSIS — Z79899 Other long term (current) drug therapy: Secondary | ICD-10-CM | POA: Diagnosis not present

## 2022-11-18 DIAGNOSIS — N2581 Secondary hyperparathyroidism of renal origin: Secondary | ICD-10-CM | POA: Diagnosis not present

## 2022-11-18 DIAGNOSIS — N186 End stage renal disease: Secondary | ICD-10-CM | POA: Diagnosis not present

## 2022-11-18 DIAGNOSIS — Z992 Dependence on renal dialysis: Secondary | ICD-10-CM | POA: Diagnosis not present

## 2022-11-20 DIAGNOSIS — Z992 Dependence on renal dialysis: Secondary | ICD-10-CM | POA: Diagnosis not present

## 2022-11-20 DIAGNOSIS — N186 End stage renal disease: Secondary | ICD-10-CM | POA: Diagnosis not present

## 2022-11-20 DIAGNOSIS — N2581 Secondary hyperparathyroidism of renal origin: Secondary | ICD-10-CM | POA: Diagnosis not present

## 2022-11-22 DIAGNOSIS — N186 End stage renal disease: Secondary | ICD-10-CM | POA: Diagnosis not present

## 2022-11-22 DIAGNOSIS — N2581 Secondary hyperparathyroidism of renal origin: Secondary | ICD-10-CM | POA: Diagnosis not present

## 2022-11-22 DIAGNOSIS — Z992 Dependence on renal dialysis: Secondary | ICD-10-CM | POA: Diagnosis not present

## 2022-11-24 DIAGNOSIS — N186 End stage renal disease: Secondary | ICD-10-CM | POA: Diagnosis not present

## 2022-11-24 DIAGNOSIS — Z992 Dependence on renal dialysis: Secondary | ICD-10-CM | POA: Diagnosis not present

## 2022-11-25 DIAGNOSIS — N186 End stage renal disease: Secondary | ICD-10-CM | POA: Diagnosis not present

## 2022-11-25 DIAGNOSIS — Z992 Dependence on renal dialysis: Secondary | ICD-10-CM | POA: Diagnosis not present

## 2022-11-25 DIAGNOSIS — N2581 Secondary hyperparathyroidism of renal origin: Secondary | ICD-10-CM | POA: Diagnosis not present

## 2022-11-27 DIAGNOSIS — Z992 Dependence on renal dialysis: Secondary | ICD-10-CM | POA: Diagnosis not present

## 2022-11-27 DIAGNOSIS — N186 End stage renal disease: Secondary | ICD-10-CM | POA: Diagnosis not present

## 2022-11-27 DIAGNOSIS — N2581 Secondary hyperparathyroidism of renal origin: Secondary | ICD-10-CM | POA: Diagnosis not present

## 2022-11-29 DIAGNOSIS — N2581 Secondary hyperparathyroidism of renal origin: Secondary | ICD-10-CM | POA: Diagnosis not present

## 2022-11-29 DIAGNOSIS — N186 End stage renal disease: Secondary | ICD-10-CM | POA: Diagnosis not present

## 2022-11-29 DIAGNOSIS — Z992 Dependence on renal dialysis: Secondary | ICD-10-CM | POA: Diagnosis not present

## 2022-12-02 DIAGNOSIS — N2581 Secondary hyperparathyroidism of renal origin: Secondary | ICD-10-CM | POA: Diagnosis not present

## 2022-12-02 DIAGNOSIS — N186 End stage renal disease: Secondary | ICD-10-CM | POA: Diagnosis not present

## 2022-12-02 DIAGNOSIS — Z992 Dependence on renal dialysis: Secondary | ICD-10-CM | POA: Diagnosis not present

## 2022-12-04 DIAGNOSIS — N2581 Secondary hyperparathyroidism of renal origin: Secondary | ICD-10-CM | POA: Diagnosis not present

## 2022-12-04 DIAGNOSIS — N186 End stage renal disease: Secondary | ICD-10-CM | POA: Diagnosis not present

## 2022-12-04 DIAGNOSIS — Z992 Dependence on renal dialysis: Secondary | ICD-10-CM | POA: Diagnosis not present

## 2022-12-06 DIAGNOSIS — Z992 Dependence on renal dialysis: Secondary | ICD-10-CM | POA: Diagnosis not present

## 2022-12-06 DIAGNOSIS — N2581 Secondary hyperparathyroidism of renal origin: Secondary | ICD-10-CM | POA: Diagnosis not present

## 2022-12-06 DIAGNOSIS — N186 End stage renal disease: Secondary | ICD-10-CM | POA: Diagnosis not present

## 2022-12-09 DIAGNOSIS — N2581 Secondary hyperparathyroidism of renal origin: Secondary | ICD-10-CM | POA: Diagnosis not present

## 2022-12-09 DIAGNOSIS — Z992 Dependence on renal dialysis: Secondary | ICD-10-CM | POA: Diagnosis not present

## 2022-12-09 DIAGNOSIS — N186 End stage renal disease: Secondary | ICD-10-CM | POA: Diagnosis not present

## 2022-12-11 DIAGNOSIS — N186 End stage renal disease: Secondary | ICD-10-CM | POA: Diagnosis not present

## 2022-12-11 DIAGNOSIS — N2581 Secondary hyperparathyroidism of renal origin: Secondary | ICD-10-CM | POA: Diagnosis not present

## 2022-12-11 DIAGNOSIS — Z992 Dependence on renal dialysis: Secondary | ICD-10-CM | POA: Diagnosis not present

## 2022-12-13 DIAGNOSIS — Z992 Dependence on renal dialysis: Secondary | ICD-10-CM | POA: Diagnosis not present

## 2022-12-13 DIAGNOSIS — N186 End stage renal disease: Secondary | ICD-10-CM | POA: Diagnosis not present

## 2022-12-13 DIAGNOSIS — N2581 Secondary hyperparathyroidism of renal origin: Secondary | ICD-10-CM | POA: Diagnosis not present

## 2022-12-16 DIAGNOSIS — N186 End stage renal disease: Secondary | ICD-10-CM | POA: Diagnosis not present

## 2022-12-16 DIAGNOSIS — N2581 Secondary hyperparathyroidism of renal origin: Secondary | ICD-10-CM | POA: Diagnosis not present

## 2022-12-16 DIAGNOSIS — Z992 Dependence on renal dialysis: Secondary | ICD-10-CM | POA: Diagnosis not present

## 2022-12-16 DIAGNOSIS — E785 Hyperlipidemia, unspecified: Secondary | ICD-10-CM | POA: Diagnosis not present

## 2022-12-16 DIAGNOSIS — Z79899 Other long term (current) drug therapy: Secondary | ICD-10-CM | POA: Diagnosis not present

## 2022-12-17 DIAGNOSIS — H2511 Age-related nuclear cataract, right eye: Secondary | ICD-10-CM | POA: Diagnosis not present

## 2022-12-17 DIAGNOSIS — Z992 Dependence on renal dialysis: Secondary | ICD-10-CM | POA: Diagnosis not present

## 2022-12-17 DIAGNOSIS — Z885 Allergy status to narcotic agent status: Secondary | ICD-10-CM | POA: Diagnosis not present

## 2022-12-17 DIAGNOSIS — I5030 Unspecified diastolic (congestive) heart failure: Secondary | ICD-10-CM | POA: Diagnosis not present

## 2022-12-17 DIAGNOSIS — I132 Hypertensive heart and chronic kidney disease with heart failure and with stage 5 chronic kidney disease, or end stage renal disease: Secondary | ICD-10-CM | POA: Diagnosis not present

## 2022-12-17 DIAGNOSIS — H401112 Primary open-angle glaucoma, right eye, moderate stage: Secondary | ICD-10-CM | POA: Diagnosis not present

## 2022-12-17 DIAGNOSIS — E785 Hyperlipidemia, unspecified: Secondary | ICD-10-CM | POA: Diagnosis not present

## 2022-12-17 DIAGNOSIS — N186 End stage renal disease: Secondary | ICD-10-CM | POA: Diagnosis not present

## 2022-12-18 DIAGNOSIS — N186 End stage renal disease: Secondary | ICD-10-CM | POA: Diagnosis not present

## 2022-12-18 DIAGNOSIS — Z992 Dependence on renal dialysis: Secondary | ICD-10-CM | POA: Diagnosis not present

## 2022-12-18 DIAGNOSIS — N2581 Secondary hyperparathyroidism of renal origin: Secondary | ICD-10-CM | POA: Diagnosis not present

## 2022-12-20 DIAGNOSIS — N2581 Secondary hyperparathyroidism of renal origin: Secondary | ICD-10-CM | POA: Diagnosis not present

## 2022-12-20 DIAGNOSIS — N186 End stage renal disease: Secondary | ICD-10-CM | POA: Diagnosis not present

## 2022-12-20 DIAGNOSIS — Z992 Dependence on renal dialysis: Secondary | ICD-10-CM | POA: Diagnosis not present

## 2022-12-23 DIAGNOSIS — N2581 Secondary hyperparathyroidism of renal origin: Secondary | ICD-10-CM | POA: Diagnosis not present

## 2022-12-23 DIAGNOSIS — N186 End stage renal disease: Secondary | ICD-10-CM | POA: Diagnosis not present

## 2022-12-23 DIAGNOSIS — Z992 Dependence on renal dialysis: Secondary | ICD-10-CM | POA: Diagnosis not present

## 2022-12-25 DIAGNOSIS — N2581 Secondary hyperparathyroidism of renal origin: Secondary | ICD-10-CM | POA: Diagnosis not present

## 2022-12-25 DIAGNOSIS — N186 End stage renal disease: Secondary | ICD-10-CM | POA: Diagnosis not present

## 2022-12-25 DIAGNOSIS — Z992 Dependence on renal dialysis: Secondary | ICD-10-CM | POA: Diagnosis not present

## 2022-12-26 DIAGNOSIS — Z992 Dependence on renal dialysis: Secondary | ICD-10-CM | POA: Diagnosis not present

## 2022-12-26 DIAGNOSIS — N186 End stage renal disease: Secondary | ICD-10-CM | POA: Diagnosis not present

## 2022-12-26 DIAGNOSIS — N2581 Secondary hyperparathyroidism of renal origin: Secondary | ICD-10-CM | POA: Diagnosis not present

## 2022-12-27 DIAGNOSIS — Z992 Dependence on renal dialysis: Secondary | ICD-10-CM | POA: Diagnosis not present

## 2022-12-27 DIAGNOSIS — N2581 Secondary hyperparathyroidism of renal origin: Secondary | ICD-10-CM | POA: Diagnosis not present

## 2022-12-27 DIAGNOSIS — N186 End stage renal disease: Secondary | ICD-10-CM | POA: Diagnosis not present

## 2022-12-30 DIAGNOSIS — Z992 Dependence on renal dialysis: Secondary | ICD-10-CM | POA: Diagnosis not present

## 2022-12-30 DIAGNOSIS — N2581 Secondary hyperparathyroidism of renal origin: Secondary | ICD-10-CM | POA: Diagnosis not present

## 2022-12-30 DIAGNOSIS — N186 End stage renal disease: Secondary | ICD-10-CM | POA: Diagnosis not present

## 2023-01-01 DIAGNOSIS — N186 End stage renal disease: Secondary | ICD-10-CM | POA: Diagnosis not present

## 2023-01-01 DIAGNOSIS — N2581 Secondary hyperparathyroidism of renal origin: Secondary | ICD-10-CM | POA: Diagnosis not present

## 2023-01-01 DIAGNOSIS — Z992 Dependence on renal dialysis: Secondary | ICD-10-CM | POA: Diagnosis not present

## 2023-01-03 DIAGNOSIS — N2581 Secondary hyperparathyroidism of renal origin: Secondary | ICD-10-CM | POA: Diagnosis not present

## 2023-01-03 DIAGNOSIS — Z992 Dependence on renal dialysis: Secondary | ICD-10-CM | POA: Diagnosis not present

## 2023-01-03 DIAGNOSIS — N186 End stage renal disease: Secondary | ICD-10-CM | POA: Diagnosis not present

## 2023-01-06 DIAGNOSIS — N186 End stage renal disease: Secondary | ICD-10-CM | POA: Diagnosis not present

## 2023-01-06 DIAGNOSIS — N2581 Secondary hyperparathyroidism of renal origin: Secondary | ICD-10-CM | POA: Diagnosis not present

## 2023-01-06 DIAGNOSIS — Z992 Dependence on renal dialysis: Secondary | ICD-10-CM | POA: Diagnosis not present

## 2023-01-08 DIAGNOSIS — N2581 Secondary hyperparathyroidism of renal origin: Secondary | ICD-10-CM | POA: Diagnosis not present

## 2023-01-08 DIAGNOSIS — Z992 Dependence on renal dialysis: Secondary | ICD-10-CM | POA: Diagnosis not present

## 2023-01-08 DIAGNOSIS — N186 End stage renal disease: Secondary | ICD-10-CM | POA: Diagnosis not present

## 2023-01-10 DIAGNOSIS — N2581 Secondary hyperparathyroidism of renal origin: Secondary | ICD-10-CM | POA: Diagnosis not present

## 2023-01-10 DIAGNOSIS — N186 End stage renal disease: Secondary | ICD-10-CM | POA: Diagnosis not present

## 2023-01-10 DIAGNOSIS — Z992 Dependence on renal dialysis: Secondary | ICD-10-CM | POA: Diagnosis not present

## 2023-01-13 DIAGNOSIS — Z992 Dependence on renal dialysis: Secondary | ICD-10-CM | POA: Diagnosis not present

## 2023-01-13 DIAGNOSIS — N2581 Secondary hyperparathyroidism of renal origin: Secondary | ICD-10-CM | POA: Diagnosis not present

## 2023-01-13 DIAGNOSIS — N186 End stage renal disease: Secondary | ICD-10-CM | POA: Diagnosis not present

## 2023-01-14 DIAGNOSIS — H409 Unspecified glaucoma: Secondary | ICD-10-CM | POA: Diagnosis not present

## 2023-01-14 DIAGNOSIS — H2512 Age-related nuclear cataract, left eye: Secondary | ICD-10-CM | POA: Diagnosis not present

## 2023-01-14 DIAGNOSIS — H401122 Primary open-angle glaucoma, left eye, moderate stage: Secondary | ICD-10-CM | POA: Diagnosis not present

## 2023-01-14 DIAGNOSIS — Z7951 Long term (current) use of inhaled steroids: Secondary | ICD-10-CM | POA: Diagnosis not present

## 2023-01-14 DIAGNOSIS — E785 Hyperlipidemia, unspecified: Secondary | ICD-10-CM | POA: Diagnosis not present

## 2023-01-14 DIAGNOSIS — N186 End stage renal disease: Secondary | ICD-10-CM | POA: Diagnosis not present

## 2023-01-14 DIAGNOSIS — M1990 Unspecified osteoarthritis, unspecified site: Secondary | ICD-10-CM | POA: Diagnosis not present

## 2023-01-14 DIAGNOSIS — D631 Anemia in chronic kidney disease: Secondary | ICD-10-CM | POA: Diagnosis not present

## 2023-01-14 DIAGNOSIS — D759 Disease of blood and blood-forming organs, unspecified: Secondary | ICD-10-CM | POA: Diagnosis not present

## 2023-01-14 DIAGNOSIS — I120 Hypertensive chronic kidney disease with stage 5 chronic kidney disease or end stage renal disease: Secondary | ICD-10-CM | POA: Diagnosis not present

## 2023-01-15 DIAGNOSIS — N186 End stage renal disease: Secondary | ICD-10-CM | POA: Diagnosis not present

## 2023-01-15 DIAGNOSIS — N2581 Secondary hyperparathyroidism of renal origin: Secondary | ICD-10-CM | POA: Diagnosis not present

## 2023-01-15 DIAGNOSIS — Z992 Dependence on renal dialysis: Secondary | ICD-10-CM | POA: Diagnosis not present

## 2023-01-17 DIAGNOSIS — Z992 Dependence on renal dialysis: Secondary | ICD-10-CM | POA: Diagnosis not present

## 2023-01-17 DIAGNOSIS — N186 End stage renal disease: Secondary | ICD-10-CM | POA: Diagnosis not present

## 2023-01-17 DIAGNOSIS — N2581 Secondary hyperparathyroidism of renal origin: Secondary | ICD-10-CM | POA: Diagnosis not present

## 2023-01-20 DIAGNOSIS — N2581 Secondary hyperparathyroidism of renal origin: Secondary | ICD-10-CM | POA: Diagnosis not present

## 2023-01-20 DIAGNOSIS — N186 End stage renal disease: Secondary | ICD-10-CM | POA: Diagnosis not present

## 2023-01-20 DIAGNOSIS — Z992 Dependence on renal dialysis: Secondary | ICD-10-CM | POA: Diagnosis not present

## 2023-01-20 DIAGNOSIS — Z79899 Other long term (current) drug therapy: Secondary | ICD-10-CM | POA: Diagnosis not present

## 2023-01-22 DIAGNOSIS — Z992 Dependence on renal dialysis: Secondary | ICD-10-CM | POA: Diagnosis not present

## 2023-01-22 DIAGNOSIS — N186 End stage renal disease: Secondary | ICD-10-CM | POA: Diagnosis not present

## 2023-01-22 DIAGNOSIS — N2581 Secondary hyperparathyroidism of renal origin: Secondary | ICD-10-CM | POA: Diagnosis not present

## 2023-01-23 DIAGNOSIS — N186 End stage renal disease: Secondary | ICD-10-CM | POA: Diagnosis not present

## 2023-01-23 DIAGNOSIS — Z79899 Other long term (current) drug therapy: Secondary | ICD-10-CM | POA: Diagnosis not present

## 2023-01-23 DIAGNOSIS — N2581 Secondary hyperparathyroidism of renal origin: Secondary | ICD-10-CM | POA: Diagnosis not present

## 2023-01-23 DIAGNOSIS — K219 Gastro-esophageal reflux disease without esophagitis: Secondary | ICD-10-CM | POA: Diagnosis not present

## 2023-01-23 DIAGNOSIS — I120 Hypertensive chronic kidney disease with stage 5 chronic kidney disease or end stage renal disease: Secondary | ICD-10-CM | POA: Diagnosis not present

## 2023-01-23 DIAGNOSIS — G809 Cerebral palsy, unspecified: Secondary | ICD-10-CM | POA: Diagnosis not present

## 2023-01-23 DIAGNOSIS — M1A30X0 Chronic gout due to renal impairment, unspecified site, without tophus (tophi): Secondary | ICD-10-CM | POA: Diagnosis not present

## 2023-01-23 DIAGNOSIS — R634 Abnormal weight loss: Secondary | ICD-10-CM | POA: Diagnosis not present

## 2023-01-23 DIAGNOSIS — E7800 Pure hypercholesterolemia, unspecified: Secondary | ICD-10-CM | POA: Diagnosis not present

## 2023-01-24 DIAGNOSIS — Z992 Dependence on renal dialysis: Secondary | ICD-10-CM | POA: Diagnosis not present

## 2023-01-24 DIAGNOSIS — N2581 Secondary hyperparathyroidism of renal origin: Secondary | ICD-10-CM | POA: Diagnosis not present

## 2023-01-24 DIAGNOSIS — N186 End stage renal disease: Secondary | ICD-10-CM | POA: Diagnosis not present

## 2023-01-25 DIAGNOSIS — Z992 Dependence on renal dialysis: Secondary | ICD-10-CM | POA: Diagnosis not present

## 2023-01-25 DIAGNOSIS — N186 End stage renal disease: Secondary | ICD-10-CM | POA: Diagnosis not present

## 2023-01-26 DIAGNOSIS — N2581 Secondary hyperparathyroidism of renal origin: Secondary | ICD-10-CM | POA: Diagnosis not present

## 2023-01-26 DIAGNOSIS — Z992 Dependence on renal dialysis: Secondary | ICD-10-CM | POA: Diagnosis not present

## 2023-01-26 DIAGNOSIS — N186 End stage renal disease: Secondary | ICD-10-CM | POA: Diagnosis not present

## 2023-01-27 DIAGNOSIS — N186 End stage renal disease: Secondary | ICD-10-CM | POA: Diagnosis not present

## 2023-01-27 DIAGNOSIS — N2581 Secondary hyperparathyroidism of renal origin: Secondary | ICD-10-CM | POA: Diagnosis not present

## 2023-01-27 DIAGNOSIS — Z992 Dependence on renal dialysis: Secondary | ICD-10-CM | POA: Diagnosis not present

## 2023-01-29 DIAGNOSIS — Z992 Dependence on renal dialysis: Secondary | ICD-10-CM | POA: Diagnosis not present

## 2023-01-29 DIAGNOSIS — N186 End stage renal disease: Secondary | ICD-10-CM | POA: Diagnosis not present

## 2023-01-29 DIAGNOSIS — N2581 Secondary hyperparathyroidism of renal origin: Secondary | ICD-10-CM | POA: Diagnosis not present

## 2023-01-31 DIAGNOSIS — N186 End stage renal disease: Secondary | ICD-10-CM | POA: Diagnosis not present

## 2023-01-31 DIAGNOSIS — Z992 Dependence on renal dialysis: Secondary | ICD-10-CM | POA: Diagnosis not present

## 2023-01-31 DIAGNOSIS — N2581 Secondary hyperparathyroidism of renal origin: Secondary | ICD-10-CM | POA: Diagnosis not present

## 2023-02-03 DIAGNOSIS — Z992 Dependence on renal dialysis: Secondary | ICD-10-CM | POA: Diagnosis not present

## 2023-02-03 DIAGNOSIS — N186 End stage renal disease: Secondary | ICD-10-CM | POA: Diagnosis not present

## 2023-02-03 DIAGNOSIS — N2581 Secondary hyperparathyroidism of renal origin: Secondary | ICD-10-CM | POA: Diagnosis not present

## 2023-02-05 DIAGNOSIS — N186 End stage renal disease: Secondary | ICD-10-CM | POA: Diagnosis not present

## 2023-02-05 DIAGNOSIS — N2581 Secondary hyperparathyroidism of renal origin: Secondary | ICD-10-CM | POA: Diagnosis not present

## 2023-02-05 DIAGNOSIS — Z992 Dependence on renal dialysis: Secondary | ICD-10-CM | POA: Diagnosis not present

## 2023-02-07 DIAGNOSIS — Z992 Dependence on renal dialysis: Secondary | ICD-10-CM | POA: Diagnosis not present

## 2023-02-07 DIAGNOSIS — N2581 Secondary hyperparathyroidism of renal origin: Secondary | ICD-10-CM | POA: Diagnosis not present

## 2023-02-07 DIAGNOSIS — N186 End stage renal disease: Secondary | ICD-10-CM | POA: Diagnosis not present

## 2023-02-10 DIAGNOSIS — Z992 Dependence on renal dialysis: Secondary | ICD-10-CM | POA: Diagnosis not present

## 2023-02-10 DIAGNOSIS — N186 End stage renal disease: Secondary | ICD-10-CM | POA: Diagnosis not present

## 2023-02-10 DIAGNOSIS — N2581 Secondary hyperparathyroidism of renal origin: Secondary | ICD-10-CM | POA: Diagnosis not present

## 2023-02-12 DIAGNOSIS — N186 End stage renal disease: Secondary | ICD-10-CM | POA: Diagnosis not present

## 2023-02-12 DIAGNOSIS — N2581 Secondary hyperparathyroidism of renal origin: Secondary | ICD-10-CM | POA: Diagnosis not present

## 2023-02-12 DIAGNOSIS — Z992 Dependence on renal dialysis: Secondary | ICD-10-CM | POA: Diagnosis not present

## 2023-02-14 DIAGNOSIS — Z992 Dependence on renal dialysis: Secondary | ICD-10-CM | POA: Diagnosis not present

## 2023-02-14 DIAGNOSIS — N186 End stage renal disease: Secondary | ICD-10-CM | POA: Diagnosis not present

## 2023-02-14 DIAGNOSIS — N2581 Secondary hyperparathyroidism of renal origin: Secondary | ICD-10-CM | POA: Diagnosis not present

## 2023-02-17 DIAGNOSIS — N186 End stage renal disease: Secondary | ICD-10-CM | POA: Diagnosis not present

## 2023-02-17 DIAGNOSIS — Z992 Dependence on renal dialysis: Secondary | ICD-10-CM | POA: Diagnosis not present

## 2023-02-17 DIAGNOSIS — N2581 Secondary hyperparathyroidism of renal origin: Secondary | ICD-10-CM | POA: Diagnosis not present

## 2023-02-18 ENCOUNTER — Ambulatory Visit
Admission: RE | Admit: 2023-02-18 | Discharge: 2023-02-18 | Disposition: A | Discharge status: Home / Self Care | Payer: Medicare HMO | Source: Ambulatory Visit | Attending: Internal Medicine | Admitting: Internal Medicine

## 2023-02-18 ENCOUNTER — Other Ambulatory Visit: Payer: Self-pay | Admitting: Internal Medicine

## 2023-02-18 DIAGNOSIS — M79604 Pain in right leg: Secondary | ICD-10-CM | POA: Diagnosis not present

## 2023-02-18 DIAGNOSIS — M7989 Other specified soft tissue disorders: Secondary | ICD-10-CM

## 2023-02-18 DIAGNOSIS — R600 Localized edema: Secondary | ICD-10-CM | POA: Diagnosis not present

## 2023-02-18 DIAGNOSIS — M79661 Pain in right lower leg: Secondary | ICD-10-CM | POA: Insufficient documentation

## 2023-02-19 DIAGNOSIS — N186 End stage renal disease: Secondary | ICD-10-CM | POA: Diagnosis not present

## 2023-02-19 DIAGNOSIS — N2581 Secondary hyperparathyroidism of renal origin: Secondary | ICD-10-CM | POA: Diagnosis not present

## 2023-02-19 DIAGNOSIS — Z992 Dependence on renal dialysis: Secondary | ICD-10-CM | POA: Diagnosis not present

## 2023-02-21 DIAGNOSIS — Z992 Dependence on renal dialysis: Secondary | ICD-10-CM | POA: Diagnosis not present

## 2023-02-21 DIAGNOSIS — N2581 Secondary hyperparathyroidism of renal origin: Secondary | ICD-10-CM | POA: Diagnosis not present

## 2023-02-21 DIAGNOSIS — N186 End stage renal disease: Secondary | ICD-10-CM | POA: Diagnosis not present

## 2023-02-24 DIAGNOSIS — Z992 Dependence on renal dialysis: Secondary | ICD-10-CM | POA: Diagnosis not present

## 2023-02-24 DIAGNOSIS — N2581 Secondary hyperparathyroidism of renal origin: Secondary | ICD-10-CM | POA: Diagnosis not present

## 2023-02-24 DIAGNOSIS — N186 End stage renal disease: Secondary | ICD-10-CM | POA: Diagnosis not present

## 2023-02-25 DIAGNOSIS — Z992 Dependence on renal dialysis: Secondary | ICD-10-CM | POA: Diagnosis not present

## 2023-02-25 DIAGNOSIS — N186 End stage renal disease: Secondary | ICD-10-CM | POA: Diagnosis not present

## 2023-02-25 DIAGNOSIS — N2581 Secondary hyperparathyroidism of renal origin: Secondary | ICD-10-CM | POA: Diagnosis not present

## 2023-02-26 DIAGNOSIS — N186 End stage renal disease: Secondary | ICD-10-CM | POA: Diagnosis not present

## 2023-02-26 DIAGNOSIS — Z992 Dependence on renal dialysis: Secondary | ICD-10-CM | POA: Diagnosis not present

## 2023-02-26 DIAGNOSIS — N2581 Secondary hyperparathyroidism of renal origin: Secondary | ICD-10-CM | POA: Diagnosis not present

## 2023-02-28 DIAGNOSIS — N2581 Secondary hyperparathyroidism of renal origin: Secondary | ICD-10-CM | POA: Diagnosis not present

## 2023-02-28 DIAGNOSIS — Z992 Dependence on renal dialysis: Secondary | ICD-10-CM | POA: Diagnosis not present

## 2023-02-28 DIAGNOSIS — N186 End stage renal disease: Secondary | ICD-10-CM | POA: Diagnosis not present

## 2023-03-03 DIAGNOSIS — N186 End stage renal disease: Secondary | ICD-10-CM | POA: Diagnosis not present

## 2023-03-03 DIAGNOSIS — Z992 Dependence on renal dialysis: Secondary | ICD-10-CM | POA: Diagnosis not present

## 2023-03-03 DIAGNOSIS — N2581 Secondary hyperparathyroidism of renal origin: Secondary | ICD-10-CM | POA: Diagnosis not present

## 2023-03-05 DIAGNOSIS — N2581 Secondary hyperparathyroidism of renal origin: Secondary | ICD-10-CM | POA: Diagnosis not present

## 2023-03-05 DIAGNOSIS — Z992 Dependence on renal dialysis: Secondary | ICD-10-CM | POA: Diagnosis not present

## 2023-03-05 DIAGNOSIS — N186 End stage renal disease: Secondary | ICD-10-CM | POA: Diagnosis not present

## 2023-03-07 DIAGNOSIS — N186 End stage renal disease: Secondary | ICD-10-CM | POA: Diagnosis not present

## 2023-03-07 DIAGNOSIS — Z992 Dependence on renal dialysis: Secondary | ICD-10-CM | POA: Diagnosis not present

## 2023-03-07 DIAGNOSIS — N2581 Secondary hyperparathyroidism of renal origin: Secondary | ICD-10-CM | POA: Diagnosis not present

## 2023-03-10 DIAGNOSIS — N186 End stage renal disease: Secondary | ICD-10-CM | POA: Diagnosis not present

## 2023-03-10 DIAGNOSIS — Z992 Dependence on renal dialysis: Secondary | ICD-10-CM | POA: Diagnosis not present

## 2023-03-10 DIAGNOSIS — N2581 Secondary hyperparathyroidism of renal origin: Secondary | ICD-10-CM | POA: Diagnosis not present

## 2023-03-12 DIAGNOSIS — Z992 Dependence on renal dialysis: Secondary | ICD-10-CM | POA: Diagnosis not present

## 2023-03-12 DIAGNOSIS — N2581 Secondary hyperparathyroidism of renal origin: Secondary | ICD-10-CM | POA: Diagnosis not present

## 2023-03-12 DIAGNOSIS — N186 End stage renal disease: Secondary | ICD-10-CM | POA: Diagnosis not present

## 2023-03-14 DIAGNOSIS — N186 End stage renal disease: Secondary | ICD-10-CM | POA: Diagnosis not present

## 2023-03-14 DIAGNOSIS — Z992 Dependence on renal dialysis: Secondary | ICD-10-CM | POA: Diagnosis not present

## 2023-03-14 DIAGNOSIS — N2581 Secondary hyperparathyroidism of renal origin: Secondary | ICD-10-CM | POA: Diagnosis not present

## 2023-03-17 DIAGNOSIS — N186 End stage renal disease: Secondary | ICD-10-CM | POA: Diagnosis not present

## 2023-03-17 DIAGNOSIS — Z992 Dependence on renal dialysis: Secondary | ICD-10-CM | POA: Diagnosis not present

## 2023-03-17 DIAGNOSIS — N2581 Secondary hyperparathyroidism of renal origin: Secondary | ICD-10-CM | POA: Diagnosis not present

## 2023-03-19 DIAGNOSIS — N186 End stage renal disease: Secondary | ICD-10-CM | POA: Diagnosis not present

## 2023-03-19 DIAGNOSIS — Z992 Dependence on renal dialysis: Secondary | ICD-10-CM | POA: Diagnosis not present

## 2023-03-19 DIAGNOSIS — N2581 Secondary hyperparathyroidism of renal origin: Secondary | ICD-10-CM | POA: Diagnosis not present

## 2023-03-21 DIAGNOSIS — N186 End stage renal disease: Secondary | ICD-10-CM | POA: Diagnosis not present

## 2023-03-21 DIAGNOSIS — N2581 Secondary hyperparathyroidism of renal origin: Secondary | ICD-10-CM | POA: Diagnosis not present

## 2023-03-21 DIAGNOSIS — Z992 Dependence on renal dialysis: Secondary | ICD-10-CM | POA: Diagnosis not present

## 2023-03-24 DIAGNOSIS — N2581 Secondary hyperparathyroidism of renal origin: Secondary | ICD-10-CM | POA: Diagnosis not present

## 2023-03-24 DIAGNOSIS — N186 End stage renal disease: Secondary | ICD-10-CM | POA: Diagnosis not present

## 2023-03-24 DIAGNOSIS — Z79899 Other long term (current) drug therapy: Secondary | ICD-10-CM | POA: Diagnosis not present

## 2023-03-24 DIAGNOSIS — Z992 Dependence on renal dialysis: Secondary | ICD-10-CM | POA: Diagnosis not present

## 2023-03-26 DIAGNOSIS — N2581 Secondary hyperparathyroidism of renal origin: Secondary | ICD-10-CM | POA: Diagnosis not present

## 2023-03-26 DIAGNOSIS — Z992 Dependence on renal dialysis: Secondary | ICD-10-CM | POA: Diagnosis not present

## 2023-03-26 DIAGNOSIS — N186 End stage renal disease: Secondary | ICD-10-CM | POA: Diagnosis not present

## 2023-03-27 DIAGNOSIS — Z992 Dependence on renal dialysis: Secondary | ICD-10-CM | POA: Diagnosis not present

## 2023-03-27 DIAGNOSIS — N186 End stage renal disease: Secondary | ICD-10-CM | POA: Diagnosis not present

## 2023-03-28 DIAGNOSIS — N186 End stage renal disease: Secondary | ICD-10-CM | POA: Diagnosis not present

## 2023-03-28 DIAGNOSIS — N2581 Secondary hyperparathyroidism of renal origin: Secondary | ICD-10-CM | POA: Diagnosis not present

## 2023-03-28 DIAGNOSIS — Z992 Dependence on renal dialysis: Secondary | ICD-10-CM | POA: Diagnosis not present

## 2023-03-31 DIAGNOSIS — N186 End stage renal disease: Secondary | ICD-10-CM | POA: Diagnosis not present

## 2023-03-31 DIAGNOSIS — N2581 Secondary hyperparathyroidism of renal origin: Secondary | ICD-10-CM | POA: Diagnosis not present

## 2023-03-31 DIAGNOSIS — Z992 Dependence on renal dialysis: Secondary | ICD-10-CM | POA: Diagnosis not present

## 2023-04-02 DIAGNOSIS — Z992 Dependence on renal dialysis: Secondary | ICD-10-CM | POA: Diagnosis not present

## 2023-04-02 DIAGNOSIS — N2581 Secondary hyperparathyroidism of renal origin: Secondary | ICD-10-CM | POA: Diagnosis not present

## 2023-04-02 DIAGNOSIS — N186 End stage renal disease: Secondary | ICD-10-CM | POA: Diagnosis not present

## 2023-04-04 DIAGNOSIS — N2581 Secondary hyperparathyroidism of renal origin: Secondary | ICD-10-CM | POA: Diagnosis not present

## 2023-04-04 DIAGNOSIS — N186 End stage renal disease: Secondary | ICD-10-CM | POA: Diagnosis not present

## 2023-04-04 DIAGNOSIS — Z992 Dependence on renal dialysis: Secondary | ICD-10-CM | POA: Diagnosis not present

## 2023-04-07 DIAGNOSIS — N2581 Secondary hyperparathyroidism of renal origin: Secondary | ICD-10-CM | POA: Diagnosis not present

## 2023-04-07 DIAGNOSIS — N186 End stage renal disease: Secondary | ICD-10-CM | POA: Diagnosis not present

## 2023-04-07 DIAGNOSIS — Z992 Dependence on renal dialysis: Secondary | ICD-10-CM | POA: Diagnosis not present

## 2023-04-09 DIAGNOSIS — Z992 Dependence on renal dialysis: Secondary | ICD-10-CM | POA: Diagnosis not present

## 2023-04-09 DIAGNOSIS — N186 End stage renal disease: Secondary | ICD-10-CM | POA: Diagnosis not present

## 2023-04-09 DIAGNOSIS — N2581 Secondary hyperparathyroidism of renal origin: Secondary | ICD-10-CM | POA: Diagnosis not present

## 2023-04-10 ENCOUNTER — Other Ambulatory Visit (INDEPENDENT_AMBULATORY_CARE_PROVIDER_SITE_OTHER): Payer: Self-pay | Admitting: Nurse Practitioner

## 2023-04-10 DIAGNOSIS — N186 End stage renal disease: Secondary | ICD-10-CM

## 2023-04-11 DIAGNOSIS — N2581 Secondary hyperparathyroidism of renal origin: Secondary | ICD-10-CM | POA: Diagnosis not present

## 2023-04-11 DIAGNOSIS — Z992 Dependence on renal dialysis: Secondary | ICD-10-CM | POA: Diagnosis not present

## 2023-04-11 DIAGNOSIS — N186 End stage renal disease: Secondary | ICD-10-CM | POA: Diagnosis not present

## 2023-04-14 DIAGNOSIS — N2581 Secondary hyperparathyroidism of renal origin: Secondary | ICD-10-CM | POA: Diagnosis not present

## 2023-04-14 DIAGNOSIS — N186 End stage renal disease: Secondary | ICD-10-CM | POA: Diagnosis not present

## 2023-04-14 DIAGNOSIS — Z992 Dependence on renal dialysis: Secondary | ICD-10-CM | POA: Diagnosis not present

## 2023-04-15 ENCOUNTER — Encounter (INDEPENDENT_AMBULATORY_CARE_PROVIDER_SITE_OTHER): Payer: Self-pay | Admitting: Nurse Practitioner

## 2023-04-15 ENCOUNTER — Ambulatory Visit (INDEPENDENT_AMBULATORY_CARE_PROVIDER_SITE_OTHER): Payer: Medicare HMO | Admitting: Nurse Practitioner

## 2023-04-15 ENCOUNTER — Ambulatory Visit (INDEPENDENT_AMBULATORY_CARE_PROVIDER_SITE_OTHER): Payer: Medicare HMO

## 2023-04-15 VITALS — BP 178/88 | HR 82 | Resp 18 | Ht 66.0 in | Wt 143.6 lb

## 2023-04-15 DIAGNOSIS — E7800 Pure hypercholesterolemia, unspecified: Secondary | ICD-10-CM | POA: Diagnosis not present

## 2023-04-15 DIAGNOSIS — N186 End stage renal disease: Secondary | ICD-10-CM

## 2023-04-15 DIAGNOSIS — I10 Essential (primary) hypertension: Secondary | ICD-10-CM | POA: Diagnosis not present

## 2023-04-15 NOTE — H&P (View-Only) (Signed)
 Subjective:    Patient ID: James Black, male    DOB: 06/28/1955, 67 y.o.   MRN: [id number] Chief Complaint  Patient presents with   Follow-up    6 month HDA    The patient returns to the office for follow up regarding a problem with their dialysis access.   The patient notes a significant increase in problems with dialysis.  It is reported that adequate dialysis is not being achieved.    The patient denies hand pain or other symptoms consistent with steal phenomena.  No significant arm swelling.  Largest concern is of-the patient is having bleeding following his dialysis treatments.  The patient denies redness or swelling at the access site. The patient denies fever or chills at home or while on dialysis.  No recent shortening of the patient's walking distance or new symptoms consistent with claudication.  No history of rest pain symptoms. No new ulcers or wounds of the lower extremities have occurred.  The patient denies amaurosis fugax or recent TIA symptoms. There are no recent neurological changes noted. There is no history of DVT, PE or superficial thrombophlebitis. No recent episodes of angina or shortness of breath documented.   Duplex ultrasound of the AV access shows a patent access.  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  Flow volume today is 2056 cc/min (previous flow volume was 1941 cc/min)    Review of Systems     Objective:   Physical Exam  BP (!) 178/88 (BP Location: Right Arm)   Pulse 82   Resp 18   Ht 5\' 6"  (1.676 m)   Wt 143 lb 9.6 oz (65.1 kg)   BMI 23.18 kg/m   Past Medical History:  Diagnosis Date   Anemia    Arthritis    Cerebral palsy (HCC)    DDD (degenerative disc disease), lumbar    Deficiency of vitamin B12    Encephalitis    ESRD (end stage renal disease) on dialysis (HCC)    M-W-F   GERD (gastroesophageal reflux disease)    Hemorrhoid    Hypercholesterolemia    Hyperkalemia    Hypertension    PONV  (postoperative nausea and vomiting)    n/v after 08-16-21 capd revision   Tremor    Tubular adenoma of colon    Vitamin D deficiency     Social History   Socioeconomic History   Marital status: Single    Spouse name: Not on file   Number of children: Not on file   Years of education: Not on file   Highest education level: Not on file  Occupational History   Not on file  Tobacco Use   Smoking status: Never    Passive exposure: Never   Smokeless tobacco: Never  Vaping Use   Vaping status: Never Used  Substance and Sexual Activity   Alcohol use: No   Drug use: No   Sexual activity: Not on file  Other Topics Concern   Not on file  Social History Narrative   Lives with niece   Social Determinants of Health   Financial Resource Strain: Not on file  Food Insecurity: No Food Insecurity (05/23/2022)   Hunger Vital Sign    Worried About Running Out of Food in the Last Year: Never true    Ran Out of Food in the Last Year: Never true  Transportation Needs: No Transportation Needs (05/23/2022)   PRAPARE - Transportation    Lack of Transportation (  Medical): No    Lack of Transportation (Non-Medical): No  Physical Activity: Not on file  Stress: Not on file  Social Connections: Not on file  Intimate Partner Violence: Not on file    Past Surgical History:  Procedure Laterality Date   A/V FISTULAGRAM Left 05/16/2022   Procedure: A/V Fistulagram;  Surgeon: Annice Needy, MD;  Location: [facility];  Service: Cardiovascular;  Laterality: Left;   A/V FISTULAGRAM Left 08/01/2022   Procedure: A/V Fistulagram;  Surgeon: Annice Needy, MD;  Location: [facility];  Service: Cardiovascular;  Laterality: Left;   AV FISTULA PLACEMENT Left 12/06/2021   Procedure: INSERTION OF ARTERIOVENOUS (AV) GORE-TEX GRAFT ARM (BRACHIAL AXILLARY);  Surgeon: Annice Needy, MD;  Location: [facility];  Service: Vascular;  Laterality: Left;   CAPD INSERTION N/A 08/10/2020   Procedure: LAPAROSCOPIC  INSERTION CONTINUOUS AMBULATORY PERITONEAL DIALYSIS  (CAPD) CATHETER;  Surgeon: Leafy Ro, MD;  Location: [facility];  Service: General;  Laterality: N/A;   CAPD REMOVAL N/A 11/08/2021   Procedure: REMOVAL CONTINUOUS AMBULATORY PERITONEAL DIALYSIS  (CAPD) CATHETER;  Surgeon: Leafy Ro, MD;  Location: [facility];  Service: General;  Laterality: N/A;   CAPD REVISION N/A 08/16/2021   Procedure: LAPAROSCOPIC REVISION CONTINUOUS AMBULATORY PERITONEAL DIALYSIS  (CAPD) CATHETER;  Surgeon: Leafy Ro, MD;  Location: [facility];  Service: General;  Laterality: N/A;   COLONOSCOPY     2003, 2011   COLONOSCOPY WITH PROPOFOL N/A 05/25/2015   Procedure: COLONOSCOPY WITH PROPOFOL;  Surgeon: Scot Jun, MD;  Location: [facility];  Service: Endoscopy;  Laterality: N/A;   COLONOSCOPY WITH PROPOFOL N/A 05/12/2020   Procedure: COLONOSCOPY WITH PROPOFOL;  Surgeon: Regis Bill, MD;  Location: [facility];  Service: Endoscopy;  Laterality: N/A;   DIALYSIS/PERMA CATHETER REMOVAL N/A 02/07/2022   Procedure: DIALYSIS/PERMA CATHETER REMOVAL;  Surgeon: Annice Needy, MD;  Location: [facility];  Service: Cardiovascular;  Laterality: N/A;   ESOPHAGOGASTRODUODENOSCOPY     2003, 2011   ESOPHAGOGASTRODUODENOSCOPY (EGD) WITH PROPOFOL N/A 05/12/2020   Procedure: ESOPHAGOGASTRODUODENOSCOPY (EGD) WITH PROPOFOL;  Surgeon: Regis Bill, MD;  Location: [facility];  Service: Endoscopy;  Laterality: N/A;   FRACTURE SURGERY Left 1997   arm   XI ROBOTIC ASSISTED INGUINAL HERNIA REPAIR WITH MESH Right 08/23/2021   Procedure: XI ROBOTIC ASSISTED INGUINAL HERNIA REPAIR WITH MESH;  Surgeon: Leafy Ro, MD;  Location: [facility];  Service: General;  Laterality: Right;    History reviewed. No pertinent family history.  Allergies  Allergen Reactions   Cefuroxime Rash    TOLERATED CEFAZOLIN   Tramadol Nausea Only and Other (See Comments)    Dizziness       Latest Ref Rng & Units  06/17/2022    2:04 PM 12/15/2021    5:00 AM 12/14/2021    4:00 AM  CBC  WBC 4.0 - 10.5 K/uL 5.6  8.0  7.8   Hemoglobin 13.0 - 17.0 g/dL 16.1  09.6  04.5   Hematocrit 39.0 - 52.0 % 32.2  34.7  35.0   Platelets 150 - 400 K/uL 184  220  181       CMP     Component Value Date/Time   NA 133 (L) 06/17/2022 1404   K 3.3 (L) 06/17/2022 1404   CL 96 (L) 06/17/2022 1404   CO2 23 06/17/2022 1404   GLUCOSE 95 06/17/2022 1404   BUN 18 06/17/2022 1404   CREATININE 4.95 (H) 06/17/2022 1404  CALCIUM 8.8 (L) 06/17/2022 1404   PROT 7.0 06/17/2022 1404   ALBUMIN 4.1 06/17/2022 1404   AST 24 06/17/2022 1404   ALT 16 06/17/2022 1404   ALKPHOS 90 06/17/2022 1404   BILITOT 1.2 06/17/2022 1404   GFRNONAA 12 (L) 06/17/2022 1404     No results found.     Assessment & Plan:   1. ESRD (end stage renal disease) (HCC) Recommend:  The patient is experiencing increasing problems with their dialysis access.  Patient should have a fistulagram with the intention for intervention.  The intention for intervention is to restore appropriate flow and prevent thrombosis and possible loss of the access.  As well as improve the quality of dialysis therapy.  The risks, benefits and alternative therapies were reviewed in detail with the patient.  All questions were answered.  The patient agrees to proceed with angio/intervention.    The patient will follow up with me in the office after the procedure.   2. Benign essential hypertension Continue antihypertensive medications as already ordered, these medications have been reviewed and there are no changes at this time.  3. Hypercholesterolemia Continue statin as ordered and reviewed, no changes at this time   Current Outpatient Medications on File Prior to Visit  Medication Sig Dispense Refill   acetaminophen (TYLENOL) 325 MG tablet Take 650 mg by mouth every 6 (six) hours as needed for mild pain.     amLODipine (NORVASC) 5 MG tablet Take 5 mg by mouth  every morning.     atorvastatin (LIPITOR) 10 MG tablet Take 10 mg by mouth every morning.     calcium acetate (PHOSLO) 667 MG capsule Take 667 mg by mouth 3 (three) times daily.     Cholecalciferol 5000 units TABS Take 5,000 Units by mouth once a week.     dorzolamide-timolol (COSOPT) 22.3-6.8 MG/ML ophthalmic solution Place 1 drop into both eyes 2 (two) times daily.     fluticasone (FLONASE) 50 MCG/ACT nasal spray Place 2 sprays into both nostrils daily as needed for allergies or rhinitis.     furosemide (LASIX) 80 MG tablet Take 80 mg by mouth daily.     hydrALAZINE (APRESOLINE) 25 MG tablet Take 1 tablet (25 mg total) by mouth every 8 (eight) hours. 90 tablet 2   lactulose (CHRONULAC) 10 GM/15ML solution Take 20 g by mouth 2 (two) times daily.     lidocaine-prilocaine (EMLA) cream Apply topically daily.     loratadine (CLARITIN) 10 MG tablet Take 10 mg by mouth daily.     losartan (COZAAR) 50 MG tablet Take 50 mg by mouth at bedtime.     metoprolol tartrate (LOPRESSOR) 25 MG tablet Take 25 mg by mouth every morning.     Netarsudil-Latanoprost (ROCKLATAN) 0.02-0.005 % SOLN Place 1 drop into both eyes every evening.     pantoprazole (PROTONIX) 20 MG tablet Take 20 mg by mouth every morning.     APIXABAN (ELIQUIS) VTE STARTER PACK (10MG  AND 5MG ) Take as directed on package: start with two-5mg  tablets twice daily for 7 days. (Already been on since 7/20.)  On day 7/27, switch to one-5mg  tablet twice daily. (Patient not taking: Reported on 08/01/2022) 1 each 0   guaiFENesin-dextromethorphan (ROBITUSSIN DM) 100-10 MG/5ML syrup Take 5 mLs by mouth every 4 (four) hours as needed for cough. (Patient not taking: Reported on 05/16/2022) 118 mL 0   HYDROcodone-acetaminophen (NORCO/VICODIN) 5-325 MG tablet Take 1-2 tablets by mouth every 6 (six) hours as needed for moderate pain. (Patient  not taking: Reported on 05/02/2022) 12 tablet 0   No current facility-administered medications on file prior to visit.     There are no Patient Instructions on file for this visit. No follow-ups on file.   Georgiana Spinner, NP

## 2023-04-15 NOTE — Progress Notes (Signed)
Subjective:    Patient ID: James Black, male    DOB: 06/28/1955, 67 y.o.   MRN: [id number] Chief Complaint  Patient presents with   Follow-up    6 month HDA    The patient returns to the office for follow up regarding a problem with their dialysis access.   The patient notes a significant increase in problems with dialysis.  It is reported that adequate dialysis is not being achieved.    The patient denies hand pain or other symptoms consistent with steal phenomena.  No significant arm swelling.  Largest concern is of-the patient is having bleeding following his dialysis treatments.  The patient denies redness or swelling at the access site. The patient denies fever or chills at home or while on dialysis.  No recent shortening of the patient's walking distance or new symptoms consistent with claudication.  No history of rest pain symptoms. No new ulcers or wounds of the lower extremities have occurred.  The patient denies amaurosis fugax or recent TIA symptoms. There are no recent neurological changes noted. There is no history of DVT, PE or superficial thrombophlebitis. No recent episodes of angina or shortness of breath documented.   Duplex ultrasound of the AV access shows a patent access.  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  Flow volume today is 2056 cc/min (previous flow volume was 1941 cc/min)    Review of Systems     Objective:   Physical Exam  BP (!) 178/88 (BP Location: Right Arm)   Pulse 82   Resp 18   Ht 5\' 6"  (1.676 m)   Wt 143 lb 9.6 oz (65.1 kg)   BMI 23.18 kg/m   Past Medical History:  Diagnosis Date   Anemia    Arthritis    Cerebral palsy (HCC)    DDD (degenerative disc disease), lumbar    Deficiency of vitamin B12    Encephalitis    ESRD (end stage renal disease) on dialysis (HCC)    M-W-F   GERD (gastroesophageal reflux disease)    Hemorrhoid    Hypercholesterolemia    Hyperkalemia    Hypertension    PONV  (postoperative nausea and vomiting)    n/v after 08-16-21 capd revision   Tremor    Tubular adenoma of colon    Vitamin D deficiency     Social History   Socioeconomic History   Marital status: Single    Spouse name: Not on file   Number of children: Not on file   Years of education: Not on file   Highest education level: Not on file  Occupational History   Not on file  Tobacco Use   Smoking status: Never    Passive exposure: Never   Smokeless tobacco: Never  Vaping Use   Vaping status: Never Used  Substance and Sexual Activity   Alcohol use: No   Drug use: No   Sexual activity: Not on file  Other Topics Concern   Not on file  Social History Narrative   Lives with niece   Social Determinants of Health   Financial Resource Strain: Not on file  Food Insecurity: No Food Insecurity (05/23/2022)   Hunger Vital Sign    Worried About Running Out of Food in the Last Year: Never true    Ran Out of Food in the Last Year: Never true  Transportation Needs: No Transportation Needs (05/23/2022)   PRAPARE - Transportation    Lack of Transportation (  Medical): No    Lack of Transportation (Non-Medical): No  Physical Activity: Not on file  Stress: Not on file  Social Connections: Not on file  Intimate Partner Violence: Not on file    Past Surgical History:  Procedure Laterality Date   A/V FISTULAGRAM Left 05/16/2022   Procedure: A/V Fistulagram;  Surgeon: Annice Needy, MD;  Location: [facility];  Service: Cardiovascular;  Laterality: Left;   A/V FISTULAGRAM Left 08/01/2022   Procedure: A/V Fistulagram;  Surgeon: Annice Needy, MD;  Location: [facility];  Service: Cardiovascular;  Laterality: Left;   AV FISTULA PLACEMENT Left 12/06/2021   Procedure: INSERTION OF ARTERIOVENOUS (AV) GORE-TEX GRAFT ARM (BRACHIAL AXILLARY);  Surgeon: Annice Needy, MD;  Location: [facility];  Service: Vascular;  Laterality: Left;   CAPD INSERTION N/A 08/10/2020   Procedure: LAPAROSCOPIC  INSERTION CONTINUOUS AMBULATORY PERITONEAL DIALYSIS  (CAPD) CATHETER;  Surgeon: Leafy Ro, MD;  Location: [facility];  Service: General;  Laterality: N/A;   CAPD REMOVAL N/A 11/08/2021   Procedure: REMOVAL CONTINUOUS AMBULATORY PERITONEAL DIALYSIS  (CAPD) CATHETER;  Surgeon: Leafy Ro, MD;  Location: [facility];  Service: General;  Laterality: N/A;   CAPD REVISION N/A 08/16/2021   Procedure: LAPAROSCOPIC REVISION CONTINUOUS AMBULATORY PERITONEAL DIALYSIS  (CAPD) CATHETER;  Surgeon: Leafy Ro, MD;  Location: [facility];  Service: General;  Laterality: N/A;   COLONOSCOPY     2003, 2011   COLONOSCOPY WITH PROPOFOL N/A 05/25/2015   Procedure: COLONOSCOPY WITH PROPOFOL;  Surgeon: Scot Jun, MD;  Location: [facility];  Service: Endoscopy;  Laterality: N/A;   COLONOSCOPY WITH PROPOFOL N/A 05/12/2020   Procedure: COLONOSCOPY WITH PROPOFOL;  Surgeon: Regis Bill, MD;  Location: [facility]OS[facility];  Service: Endoscopy;  Laterality: N/A;   DIALYSIS/PERMA CATHETER REMOVAL N/A 02/07/2022   Procedure: DIALYSIS/PERMA CATHETER REMOVAL;  Surgeon: Annice Needy, MD;  Location: [facility];  Service: Cardiovascular;  Laterality: N/A;   ESOPHAGOGASTRODUODENOSCOPY     2003, 2011   ESOPHAGOGASTRODUODENOSCOPY (EGD) WITH PROPOFOL N/A 05/12/2020   Procedure: ESOPHAGOGASTRODUODENOSCOPY (EGD) WITH PROPOFOL;  Surgeon: Regis Bill, MD;  Location: [facility] ENDOS[facility];  Service: Endoscopy;  Laterality: N/A;   FRACTURE SURGERY Left 1997   arm   XI ROBOTIC ASSISTED INGUINAL HERNIA REPAIR WITH MESH Right 08/23/2021   Procedure: XI ROBOTIC ASSISTED INGUINAL HERNIA REPAIR WITH MESH;  Surgeon: Leafy Ro, MD;  Location: [facility];  Service: General;  Laterality: Right;    History reviewed. No pertinent family history.  Allergies  Allergen Reactions   Cefuroxime Rash    TOLERATED CEFAZOLIN   Tramadol Nausea Only and Other (See Comments)    Dizziness       Latest Ref Rng & Units  06/17/2022    2:04 PM 12/15/2021    5:00 AM 12/14/2021    4:00 AM  CBC  WBC 4.0 - 10.5 K/uL 5.6  8.0  7.8   Hemoglobin 13.0 - 17.0 g/dL 16.1  09.6  04.5   Hematocrit 39.0 - 52.0 % 32.2  34.7  35.0   Platelets 150 - 400 K/uL 184  220  181       CMP     Component Value Date/Time   NA 133 (L) 06/17/2022 1404   K 3.3 (L) 06/17/2022 1404   CL 96 (L) 06/17/2022 1404   CO2 23 06/17/2022 1404   GLUCOSE 95 06/17/2022 1404   BUN 18 06/17/2022 1404   CREATININE 4.95 (H) 06/17/2022 1404  CALCIUM 8.8 (L) 06/17/2022 1404   PROT 7.0 06/17/2022 1404   ALBUMIN 4.1 06/17/2022 1404   AST 24 06/17/2022 1404   ALT 16 06/17/2022 1404   ALKPHOS 90 06/17/2022 1404   BILITOT 1.2 06/17/2022 1404   GFRNONAA 12 (L) 06/17/2022 1404     No results found.     Assessment & Plan:   1. ESRD (end stage renal disease) (HCC) Recommend:  The patient is experiencing increasing problems with their dialysis access.  Patient should have a fistulagram with the intention for intervention.  The intention for intervention is to restore appropriate flow and prevent thrombosis and possible loss of the access.  As well as improve the quality of dialysis therapy.  The risks, benefits and alternative therapies were reviewed in detail with the patient.  All questions were answered.  The patient agrees to proceed with angio/intervention.    The patient will follow up with me in the office after the procedure.   2. Benign essential hypertension Continue antihypertensive medications as already ordered, these medications have been reviewed and there are no changes at this time.  3. Hypercholesterolemia Continue statin as ordered and reviewed, no changes at this time   Current Outpatient Medications on File Prior to Visit  Medication Sig Dispense Refill   acetaminophen (TYLENOL) 325 MG tablet Take 650 mg by mouth every 6 (six) hours as needed for mild pain.     amLODipine (NORVASC) 5 MG tablet Take 5 mg by mouth  every morning.     atorvastatin (LIPITOR) 10 MG tablet Take 10 mg by mouth every morning.     calcium acetate (PHOSLO) 667 MG capsule Take 667 mg by mouth 3 (three) times daily.     Cholecalciferol 5000 units TABS Take 5,000 Units by mouth once a week.     dorzolamide-timolol (COSOPT) 22.3-6.8 MG/ML ophthalmic solution Place 1 drop into both eyes 2 (two) times daily.     fluticasone (FLONASE) 50 MCG/ACT nasal spray Place 2 sprays into both nostrils daily as needed for allergies or rhinitis.     furosemide (LASIX) 80 MG tablet Take 80 mg by mouth daily.     hydrALAZINE (APRESOLINE) 25 MG tablet Take 1 tablet (25 mg total) by mouth every 8 (eight) hours. 90 tablet 2   lactulose (CHRONULAC) 10 GM/15ML solution Take 20 g by mouth 2 (two) times daily.     lidocaine-prilocaine (EMLA) cream Apply topically daily.     loratadine (CLARITIN) 10 MG tablet Take 10 mg by mouth daily.     losartan (COZAAR) 50 MG tablet Take 50 mg by mouth at bedtime.     metoprolol tartrate (LOPRESSOR) 25 MG tablet Take 25 mg by mouth every morning.     Netarsudil-Latanoprost (ROCKLATAN) 0.02-0.005 % SOLN Place 1 drop into both eyes every evening.     pantoprazole (PROTONIX) 20 MG tablet Take 20 mg by mouth every morning.     APIXABAN (ELIQUIS) VTE STARTER PACK (10MG  AND 5MG ) Take as directed on package: start with two-5mg  tablets twice daily for 7 days. (Already been on since 7/20.)  On day 7/27, switch to one-5mg  tablet twice daily. (Patient not taking: Reported on 08/01/2022) 1 each 0   guaiFENesin-dextromethorphan (ROBITUSSIN DM) 100-10 MG/5ML syrup Take 5 mLs by mouth every 4 (four) hours as needed for cough. (Patient not taking: Reported on 05/16/2022) 118 mL 0   HYDROcodone-acetaminophen (NORCO/VICODIN) 5-325 MG tablet Take 1-2 tablets by mouth every 6 (six) hours as needed for moderate pain. (Patient  not taking: Reported on 05/02/2022) 12 tablet 0   No current facility-administered medications on file prior to visit.     There are no Patient Instructions on file for this visit. No follow-ups on file.   Georgiana Spinner, NP

## 2023-04-16 ENCOUNTER — Telehealth (INDEPENDENT_AMBULATORY_CARE_PROVIDER_SITE_OTHER): Payer: Self-pay

## 2023-04-16 DIAGNOSIS — N186 End stage renal disease: Secondary | ICD-10-CM | POA: Diagnosis not present

## 2023-04-16 DIAGNOSIS — N2581 Secondary hyperparathyroidism of renal origin: Secondary | ICD-10-CM | POA: Diagnosis not present

## 2023-04-16 DIAGNOSIS — Z992 Dependence on renal dialysis: Secondary | ICD-10-CM | POA: Diagnosis not present

## 2023-04-16 NOTE — Telephone Encounter (Signed)
Spoke with the patient's niece Tammy to schedule him for a left arm fistulagram with Dr. Wyn Quaker. Patient has a 6:45 am arrival time to the [facility]. Pre-procedure instructions were discussed and will be mailed.

## 2023-04-18 DIAGNOSIS — N2581 Secondary hyperparathyroidism of renal origin: Secondary | ICD-10-CM | POA: Diagnosis not present

## 2023-04-18 DIAGNOSIS — Z992 Dependence on renal dialysis: Secondary | ICD-10-CM | POA: Diagnosis not present

## 2023-04-18 DIAGNOSIS — N186 End stage renal disease: Secondary | ICD-10-CM | POA: Diagnosis not present

## 2023-04-21 DIAGNOSIS — Z79899 Other long term (current) drug therapy: Secondary | ICD-10-CM | POA: Diagnosis not present

## 2023-04-21 DIAGNOSIS — N2581 Secondary hyperparathyroidism of renal origin: Secondary | ICD-10-CM | POA: Diagnosis not present

## 2023-04-21 DIAGNOSIS — N186 End stage renal disease: Secondary | ICD-10-CM | POA: Diagnosis not present

## 2023-04-21 DIAGNOSIS — Z992 Dependence on renal dialysis: Secondary | ICD-10-CM | POA: Diagnosis not present

## 2023-04-23 DIAGNOSIS — N186 End stage renal disease: Secondary | ICD-10-CM | POA: Diagnosis not present

## 2023-04-23 DIAGNOSIS — Z992 Dependence on renal dialysis: Secondary | ICD-10-CM | POA: Diagnosis not present

## 2023-04-23 DIAGNOSIS — N2581 Secondary hyperparathyroidism of renal origin: Secondary | ICD-10-CM | POA: Diagnosis not present

## 2023-04-25 DIAGNOSIS — N2581 Secondary hyperparathyroidism of renal origin: Secondary | ICD-10-CM | POA: Diagnosis not present

## 2023-04-25 DIAGNOSIS — N186 End stage renal disease: Secondary | ICD-10-CM | POA: Diagnosis not present

## 2023-04-25 DIAGNOSIS — Z992 Dependence on renal dialysis: Secondary | ICD-10-CM | POA: Diagnosis not present

## 2023-04-26 DIAGNOSIS — Z992 Dependence on renal dialysis: Secondary | ICD-10-CM | POA: Diagnosis not present

## 2023-04-26 DIAGNOSIS — N186 End stage renal disease: Secondary | ICD-10-CM | POA: Diagnosis not present

## 2023-04-27 DIAGNOSIS — N186 End stage renal disease: Secondary | ICD-10-CM | POA: Diagnosis not present

## 2023-04-27 DIAGNOSIS — Z992 Dependence on renal dialysis: Secondary | ICD-10-CM | POA: Diagnosis not present

## 2023-04-28 DIAGNOSIS — N186 End stage renal disease: Secondary | ICD-10-CM | POA: Diagnosis not present

## 2023-04-28 DIAGNOSIS — Z992 Dependence on renal dialysis: Secondary | ICD-10-CM | POA: Diagnosis not present

## 2023-04-29 DIAGNOSIS — H401133 Primary open-angle glaucoma, bilateral, severe stage: Secondary | ICD-10-CM | POA: Diagnosis not present

## 2023-04-30 ENCOUNTER — Other Ambulatory Visit: Payer: Self-pay

## 2023-04-30 ENCOUNTER — Other Ambulatory Visit (INDEPENDENT_AMBULATORY_CARE_PROVIDER_SITE_OTHER): Payer: Self-pay | Admitting: Nurse Practitioner

## 2023-04-30 ENCOUNTER — Emergency Department
Admission: EM | Admit: 2023-04-30 | Discharge: 2023-04-30 | Disposition: A | Discharge status: Home / Self Care | Payer: Medicare HMO | Attending: Emergency Medicine | Admitting: Emergency Medicine

## 2023-04-30 DIAGNOSIS — I120 Hypertensive chronic kidney disease with stage 5 chronic kidney disease or end stage renal disease: Secondary | ICD-10-CM | POA: Diagnosis not present

## 2023-04-30 DIAGNOSIS — I10 Essential (primary) hypertension: Secondary | ICD-10-CM | POA: Diagnosis not present

## 2023-04-30 DIAGNOSIS — N186 End stage renal disease: Secondary | ICD-10-CM | POA: Insufficient documentation

## 2023-04-30 DIAGNOSIS — R58 Hemorrhage, not elsewhere classified: Secondary | ICD-10-CM | POA: Diagnosis not present

## 2023-04-30 DIAGNOSIS — T82838A Hemorrhage of vascular prosthetic devices, implants and grafts, initial encounter: Secondary | ICD-10-CM | POA: Diagnosis not present

## 2023-04-30 DIAGNOSIS — Z992 Dependence on renal dialysis: Secondary | ICD-10-CM | POA: Diagnosis not present

## 2023-04-30 DIAGNOSIS — L988 Other specified disorders of the skin and subcutaneous tissue: Secondary | ICD-10-CM

## 2023-04-30 LAB — CBC WITH DIFFERENTIAL/PLATELET
Abs Immature Granulocytes: 0.03 10*3/uL (ref 0.00–0.07)
Basophils Absolute: 0.1 10*3/uL (ref 0.0–0.1)
Basophils Relative: 1 %
Eosinophils Absolute: 0.2 10*3/uL (ref 0.0–0.5)
Eosinophils Relative: 4 %
HCT: 38.3 % — ABNORMAL LOW (ref 39.0–52.0)
Hemoglobin: 11.9 g/dL — ABNORMAL LOW (ref 13.0–17.0)
Immature Granulocytes: 1 %
Lymphocytes Relative: 11 %
Lymphs Abs: 0.6 10*3/uL — ABNORMAL LOW (ref 0.7–4.0)
MCH: 29.5 pg (ref 26.0–34.0)
MCHC: 31.1 g/dL (ref 30.0–36.0)
MCV: 95 fL (ref 80.0–100.0)
Monocytes Absolute: 0.4 10*3/uL (ref 0.1–1.0)
Monocytes Relative: 8 %
Neutro Abs: 3.9 10*3/uL (ref 1.7–7.7)
Neutrophils Relative %: 75 %
Platelets: 173 10*3/uL (ref 150–400)
RBC: 4.03 MIL/uL — ABNORMAL LOW (ref 4.22–5.81)
RDW: 15.9 % — ABNORMAL HIGH (ref 11.5–15.5)
WBC: 5.2 10*3/uL (ref 4.0–10.5)
nRBC: 0 % (ref 0.0–0.2)

## 2023-04-30 LAB — COMPREHENSIVE METABOLIC PANEL
ALT: 12 U/L (ref 0–44)
AST: 22 U/L (ref 15–41)
Albumin: 4.6 g/dL (ref 3.5–5.0)
Alkaline Phosphatase: 109 U/L (ref 38–126)
Anion gap: 14 (ref 5–15)
BUN: 24 mg/dL — ABNORMAL HIGH (ref 8–23)
CO2: 27 mmol/L (ref 22–32)
Calcium: 9.6 mg/dL (ref 8.9–10.3)
Chloride: 97 mmol/L — ABNORMAL LOW (ref 98–111)
Creatinine, Ser: 5.43 mg/dL — ABNORMAL HIGH (ref 0.61–1.24)
GFR, Estimated: 11 mL/min — ABNORMAL LOW (ref >60)
Glucose, Bld: 83 mg/dL (ref 70–99)
Potassium: 3.3 mmol/L — ABNORMAL LOW (ref 3.5–5.1)
Sodium: 138 mmol/L (ref 135–145)
Total Bilirubin: 1.9 mg/dL — ABNORMAL HIGH (ref <1.2)
Total Protein: 8.2 g/dL — ABNORMAL HIGH (ref 6.5–8.1)

## 2023-04-30 LAB — PROTIME-INR
INR: 1.2 (ref 0.8–1.2)
Prothrombin Time: 15.2 s (ref 11.4–15.2)

## 2023-04-30 LAB — TYPE AND SCREEN
ABO/RH(D): O POS
Antibody Screen: NEGATIVE

## 2023-04-30 LAB — APTT: aPTT: 56 s — ABNORMAL HIGH (ref 24–36)

## 2023-04-30 NOTE — ED Triage Notes (Signed)
Coming from dialysis. Full diaylsis treatment stopped at 9.5. His left side fistula stared bleeding since 0900. No blood thinners. Patient AOX4. Patients bleeding is under control at this time. Wrapped with bandages on the left arm at time of arrival with EMS. EDP at bedside.

## 2023-04-30 NOTE — Discharge Instructions (Signed)
Your blood work was reassuring and you are having follow-up tomorrow with vascular to discuss why your fistula continues to recurrently bleed.  We were able to stop the bleeding today with just some pressure and you have been here for over an hour without any recurrent bleeding.  We have lightly wrapped some Coban over the area and this can be removed around 2 PM.  You can return to the ER if it starts rebleeding again and hold pressure until EMS gets there with a finger over the area that is bleeding.   Turn to ER for worsening bleeding or any other concerns

## 2023-04-30 NOTE — ED Provider Notes (Signed)
[facility] Provider Note    None    (approximate)   History   Fistula bleeding.   HPI  James Black is a 67 y.o. male with ESRD who comes in with concerns for fistula bleeding.  Patient is being planned to see by vascular surgery tomorrow for fistulagram.  He is been having continued issues with bleeding.  He has been bleeding for the past hour after dialysis so they brought him here to be evaluated.  He otherwise reports feeling fine.  Reports initially the bleeding was pulsatile but when EMS got there it had slowed down.  He denies being on any blood thinner.   Physical Exam   Triage Vital Signs: ED Triage Vitals  Encounter Vitals Group     BP --      Systolic BP Percentile --      Diastolic BP Percentile --      Pulse Rate 04/30/23 1038 81     Resp --      Temp --      Temp src --      SpO2 04/30/23 1038 98 %     Weight --      Height --      Head Circumference --      Peak Flow --      Pain Score 04/30/23 1039 0     Pain Loc --      Pain Education --      Exclude from Growth Chart --     Most recent vital signs: Vitals:   04/30/23 1038  Pulse: 81  SpO2: 98%     General: Awake, no distress.  CV:  Good peripheral perfusion.  Resp:  Normal effort.  Abd:  No distention.  Other:  Wrap was removed and there was no significant bleeding noted from his fistula.  There is 2 pin prick marks noted in one of them did have a little bit of an tiny tiny ooze but nothing of significance.   ED Results / Procedures / Treatments   Labs (all labs ordered are listed, but only abnormal results are displayed) Labs Reviewed  CBC WITH DIFFERENTIAL/PLATELET  PROTIME-INR  APTT  COMPREHENSIVE METABOLIC PANEL     EKG  My interpretation of EKG:  Normal sinus rate of 72 without any ST elevation or T wave inversions, normal intervals   PROCEDURES:  Critical Care performed: No  .1-3 Lead EKG Interpretation  Performed by: Concha Se,  MD Authorized by: Concha Se, MD     Interpretation: normal     ECG rate:  70   ECG rate assessment: normal     Rhythm: sinus rhythm     Ectopy: none     Conduction: normal      MEDICATIONS ORDERED IN ED: Medications - No data to display   IMPRESSION / MDM / ASSESSMENT AND PLAN / ED COURSE  I reviewed the triage vital signs and the nursing notes.   Patient's presentation is most consistent with severe exacerbation of chronic illness.   Patient comes in with concern for fistula bleeding.  This been a re-current issue and he is planned to have outpatient procedure done tomorrow to further evaluate it.  Upon my assessment there is no significant bleeding at this time.  Will get labs to evaluate for potential coagulopathy, anemia or other acute cause of recurrent bleeding.  Patient had a compressive bandage placed and a fistula clamp held on to the area and  will reevaluate to ensure no additional bleeding.  CBC shows stable hemoglobin, normal platelets.  Coags are normal slightly elevated PTT but similar to prior.  CMP shows no electrolyte abnormalities and he did complete a full session of dialysis.  Wrap was taken down and there was no bleeding noted.  I did loosely put another Curlex bandage over it with a slight compression with Coban.  I recommended he take the Coban off in 1 to 2 hours but at this time there is been no additional bleeding.  Patient has been in the emergency room for over an hour without any recurrent bleeding he feels comfortable with discharge home without any recurrent symptoms.   The patient is on the cardiac monitor to evaluate for evidence of arrhythmia and/or significant heart rate changes.      FINAL CLINICAL IMPRESSION(S) / ED DIAGNOSES   Final diagnoses:  Fistula  ESRD (end stage renal disease) on dialysis (HCC)  Bleeding     Rx / DC Orders   ED Discharge Orders     None        Note:  This document was prepared using Dragon voice  recognition software and may include unintentional dictation errors.   Concha Se, MD 04/30/23 [id number]

## 2023-04-30 NOTE — Progress Notes (Signed)
Due to a change in vascular lab schedule patient's procedure will now start at 8:30.  This patient was called and I spoke with his niece.  She agreed patient can change arrival from 6:45am to 7:30am tomorrow morning.

## 2023-05-01 ENCOUNTER — Ambulatory Visit
Admission: RE | Admit: 2023-05-01 | Discharge: 2023-05-01 | Disposition: A | Discharge status: Home / Self Care | Payer: Medicare HMO | Attending: Vascular Surgery | Admitting: Vascular Surgery

## 2023-05-01 ENCOUNTER — Encounter: Payer: Self-pay | Admitting: Vascular Surgery

## 2023-05-01 ENCOUNTER — Other Ambulatory Visit: Payer: Self-pay

## 2023-05-01 ENCOUNTER — Encounter
Admission: RE | Disposition: A | Discharge status: Home / Self Care | Payer: Self-pay | Source: Home / Self Care | Attending: Vascular Surgery

## 2023-05-01 DIAGNOSIS — T82858A Stenosis of vascular prosthetic devices, implants and grafts, initial encounter: Secondary | ICD-10-CM | POA: Diagnosis not present

## 2023-05-01 DIAGNOSIS — Z992 Dependence on renal dialysis: Secondary | ICD-10-CM | POA: Diagnosis not present

## 2023-05-01 DIAGNOSIS — E7800 Pure hypercholesterolemia, unspecified: Secondary | ICD-10-CM | POA: Diagnosis not present

## 2023-05-01 DIAGNOSIS — Y832 Surgical operation with anastomosis, bypass or graft as the cause of abnormal reaction of the patient, or of later complication, without mention of misadventure at the time of the procedure: Secondary | ICD-10-CM | POA: Insufficient documentation

## 2023-05-01 DIAGNOSIS — N186 End stage renal disease: Secondary | ICD-10-CM | POA: Diagnosis not present

## 2023-05-01 DIAGNOSIS — T82898A Other specified complication of vascular prosthetic devices, implants and grafts, initial encounter: Secondary | ICD-10-CM

## 2023-05-01 DIAGNOSIS — I120 Hypertensive chronic kidney disease with stage 5 chronic kidney disease or end stage renal disease: Secondary | ICD-10-CM | POA: Diagnosis not present

## 2023-05-01 HISTORY — PX: A/V FISTULAGRAM: CATH118298

## 2023-05-01 LAB — POTASSIUM (ARMC VASCULAR LAB ONLY): Potassium (ARMC vascular lab): 4.2 mmol/L (ref 3.5–5.1)

## 2023-05-01 SURGERY — A/V FISTULAGRAM
Anesthesia: Moderate Sedation | Laterality: Left

## 2023-05-01 MED ORDER — FENTANYL CITRATE (PF) 100 MCG/2ML IJ SOLN
INTRAMUSCULAR | Status: AC
  Filled 2023-05-01: qty 2

## 2023-05-01 MED ORDER — SODIUM CHLORIDE 0.9 % IV SOLN: INTRAVENOUS | Status: DC

## 2023-05-01 MED ORDER — MIDAZOLAM HCL 2 MG/2ML IJ SOLN
INTRAMUSCULAR | Status: DC | PRN
  Administered 2023-05-01: 1 mg via INTRAVENOUS

## 2023-05-01 MED ORDER — MIDAZOLAM HCL 5 MG/5ML IJ SOLN
INTRAMUSCULAR | Status: AC
  Filled 2023-05-01: qty 5

## 2023-05-01 MED ORDER — DIPHENHYDRAMINE HCL 50 MG/ML IJ SOLN: 50.0000 mg | Freq: Once | INTRAMUSCULAR | Status: DC | PRN

## 2023-05-01 MED ORDER — METHYLPREDNISOLONE SODIUM SUCC 125 MG IJ SOLR: 125.0000 mg | Freq: Once | INTRAMUSCULAR | Status: DC | PRN

## 2023-05-01 MED ORDER — MIDAZOLAM HCL 2 MG/ML PO SYRP: 8.0000 mg | ORAL_SOLUTION | Freq: Once | ORAL | Status: DC | PRN

## 2023-05-01 MED ORDER — HEPARIN SODIUM (PORCINE) 1000 UNIT/ML IJ SOLN
INTRAMUSCULAR | Status: AC
  Filled 2023-05-01: qty 10

## 2023-05-01 MED ORDER — LIDOCAINE-EPINEPHRINE (PF) 1 %-1:200000 IJ SOLN
INTRAMUSCULAR | Status: DC | PRN
  Administered 2023-05-01: 5 mL via INTRADERMAL

## 2023-05-01 MED ORDER — FENTANYL CITRATE (PF) 100 MCG/2ML IJ SOLN
INTRAMUSCULAR | Status: DC | PRN
  Administered 2023-05-01: 50 ug via INTRAVENOUS

## 2023-05-01 MED ORDER — CEFAZOLIN SODIUM-DEXTROSE 1-4 GM/50ML-% IV SOLN
1.0000 g | INTRAVENOUS | Status: AC
  Administered 2023-05-01: 1 g via INTRAVENOUS

## 2023-05-01 MED ORDER — HEPARIN SODIUM (PORCINE) 1000 UNIT/ML IJ SOLN
INTRAMUSCULAR | Status: DC | PRN
  Administered 2023-05-01: 3000 [IU] via INTRAVENOUS

## 2023-05-01 MED ORDER — ONDANSETRON HCL 4 MG/2ML IJ SOLN: 4.0000 mg | Freq: Four times a day (QID) | INTRAMUSCULAR | Status: DC | PRN

## 2023-05-01 MED ORDER — HYDROMORPHONE HCL 1 MG/ML IJ SOLN
1.0000 mg | Freq: Once | INTRAMUSCULAR | Status: DC | PRN
Start: 2023-05-01 — End: 2023-05-01

## 2023-05-01 MED ORDER — CEFAZOLIN SODIUM-DEXTROSE 1-4 GM/50ML-% IV SOLN
INTRAVENOUS | Status: AC
  Filled 2023-05-01: qty 50

## 2023-05-01 MED ORDER — HEPARIN (PORCINE) IN NACL 1000-0.9 UT/500ML-% IV SOLN
INTRAVENOUS | Status: DC | PRN
  Administered 2023-05-01: 500 mL

## 2023-05-01 MED ORDER — FAMOTIDINE 20 MG PO TABS: 40.0000 mg | ORAL_TABLET | Freq: Once | ORAL | Status: DC | PRN

## 2023-05-01 MED ORDER — IODIXANOL 320 MG/ML IV SOLN
INTRAVENOUS | Status: DC | PRN
  Administered 2023-05-01: 20 mL via INTRAVENOUS

## 2023-05-01 SURGICAL SUPPLY — 12 items
BALLN LUTONIX 7X80X130 (BALLOONS) ×1
BALLOON LUTONIX 7X80X130 (BALLOONS) IMPLANT
CANNULA 5F STIFF (CANNULA) IMPLANT
COVER PROBE ULTRASOUND 5X96 (MISCELLANEOUS) IMPLANT
DEVICE PRESTO INFLATION (MISCELLANEOUS) IMPLANT
DRAPE BRACHIAL (DRAPES) IMPLANT
PACK ANGIOGRAPHY (CUSTOM PROCEDURE TRAY) ×1 IMPLANT
SHEATH BRITE TIP 6FRX5.5 (SHEATH) IMPLANT
SHEATH BRITE TIP 7FRX5.5 (SHEATH) IMPLANT
STENT VIABAHN 8X7.5X120 (Permanent Stent) IMPLANT
SUT MNCRL AB 4-0 PS2 18 (SUTURE) IMPLANT
WIRE G V18X300CM (WIRE) IMPLANT

## 2023-05-01 NOTE — Op Note (Signed)
[facility]    OPERATIVE NOTE   PROCEDURE: 1.  Left brachial artery to axillary vein arteriovenous graft cannulation under ultrasound guidance 2.  Left arm shuntogram 3.  Percutaneous transluminal angioplasty of venous anastomotic stenosis with 7 mm diameter by 8 cm length Lutonix drug-coated angioplasty balloon 4.  Covered stent placement for treatment of pseudoaneurysms in the mid graft with 8 mm diameter by 7.5 cm length Viabahn stent  PRE-OPERATIVE DIAGNOSIS: 1. ESRD 2. Malfunctioning left brachial artery to axillary vein arteriovenous graft with symptomatic painful pseudoaneurysms in the mid graft  POST-OPERATIVE DIAGNOSIS: same as above   SURGEON: Festus Barren, MD  ANESTHESIA: local with MCS  ESTIMATED BLOOD LOSS: 10 cc  FINDING(S): The graft had moderate to large pseudoaneurysm at the access sites without obvious focal stenosis within the graft.  The axillary vein at the venous anastomosis just beyond the graft had about a 60 to 70% stenosis.  The remainder of the central venous circulation was widely patent.  SPECIMEN(S):  None  CONTRAST: 20 cc  FLUORO TIME: 1.8 minutes  MODERATE CONSCIOUS SEDATION TIME:  Approximately 52 minutes using 1 mg of Versed and 50 mcg of Fentanyl  INDICATIONS: James Black is a 67 y.o. male who presents with malfunctioning left brachial artery to axillary vein arteriovenous graft.  The patient is scheduled for left arm shuntogram.  The patient is aware the risks include but are not limited to: bleeding, infection, thrombosis of the cannulated access, and possible anaphylactic reaction to the contrast.  The patient is aware of the risks of the procedure and elects to proceed forward.  DESCRIPTION: After full informed written consent was obtained, the patient was brought back to the angiography suite and placed supine upon the angiography table.  The patient was connected to monitoring equipment. Moderate conscious sedation  was administered during a face to face encounter throughout the procedure with my supervision of the RN administering medicines and monitoring the patient's vital signs, pulse oximetry, telemetry and mental status throughout from the start of the procedure until the patient was taken to the recovery room The left arm was prepped and draped in the standard fashion for a percutaneous access intervention.  Under ultrasound guidance, the left brachial artery to axillary vein arteriovenous graft was cannulated with a micropuncture needle under direct ultrasound guidance were it was patent and a permanent image was performed.  The microwire was advanced into the graft and the needle was exchanged for the a microsheath.  I then upsized to a 6 Fr Sheath and imaging was performed.  Hand injections were completed to image the access including the central venous system. This demonstrated a graft had moderate to large pseudoaneurysm at the access sites without obvious focal stenosis within the graft.  The axillary vein at the venous anastomosis just beyond the graft had about a 60 to 70% stenosis.  The remainder of the central venous circulation was widely patent..  Based on the images, this patient will need intervention to both the venous anastomotic stenosis and covered stent placement for the pseudoaneurysms within the graft. I then gave the patient 3000 units of intravenous heparin.  I then upsized to a 7 [place] sheath and exchanged for a V18 wire.  I then crossed the stenosis with a V18 wire.  Based on the imaging, a 7 mm x 8 cm Lutonix drug-coated angioplasty balloon was selected.  The balloon was centered around the venous anastomotic stenosis and inflated to 10 ATM for 1  minute(s).  On completion imaging, a 20-25% residual stenosis was present.   I then turned my attention to the pseudoaneurysms within the graft.  An 8 mm diameter by 7.5 cm length Viabahn stent was then deployed to encompass the pseudoaneurysms and  cover these proximally and distally.  This was postdilated with a 7 mm balloon with excellent angiographic completion result and no flow residual in the pseudoaneurysms.  Based on the completion imaging, no further intervention is necessary.  The wire and balloon were removed from the sheath.  A 4-0 Monocryl purse-string suture was sewn around the sheath.  The sheath was removed while tying down the suture.  A sterile bandage was applied to the puncture site.  COMPLICATIONS: None  CONDITION: Stable   Festus Barren  05/01/2023 10:36 AM    This note was created with Dragon Medical transcription system. Any errors in dictation are purely unintentional.

## 2023-05-01 NOTE — Progress Notes (Signed)
Dr. Wyn Quaker at bedside, speaking with pt. And his niece Tammy re: procedural results. Both verbalized understanding of conversation with MD.

## 2023-05-01 NOTE — Interval H&P Note (Signed)
History and Physical Interval Note:  05/01/2023 7:22 AM  James Black  has presented today for surgery, with the diagnosis of L arm fistulagram   End Stage Renal.  The various methods of treatment have been discussed with the patient and family. After consideration of risks, benefits and other options for treatment, the patient has consented to  Procedure(s): A/V Fistulagram (Left) as a surgical intervention.  The patient's history has been reviewed, patient examined, no change in status, stable for surgery.  I have reviewed the patient's chart and labs.  Questions were answered to the patient's satisfaction.     Festus Barren

## 2023-05-02 ENCOUNTER — Telehealth (INDEPENDENT_AMBULATORY_CARE_PROVIDER_SITE_OTHER): Payer: Self-pay | Admitting: Nurse Practitioner

## 2023-05-02 DIAGNOSIS — Z992 Dependence on renal dialysis: Secondary | ICD-10-CM | POA: Diagnosis not present

## 2023-05-02 DIAGNOSIS — N186 End stage renal disease: Secondary | ICD-10-CM | POA: Diagnosis not present

## 2023-05-02 DIAGNOSIS — E785 Hyperlipidemia, unspecified: Secondary | ICD-10-CM | POA: Diagnosis not present

## 2023-05-02 NOTE — Telephone Encounter (Signed)
LVM for pt TCB and schedule appt  Follow up with Georgiana Spinner, NP (Vascular Surgery) in 6 weeks (06/12/2023); with HDA

## 2023-05-05 DIAGNOSIS — Z992 Dependence on renal dialysis: Secondary | ICD-10-CM | POA: Diagnosis not present

## 2023-05-05 DIAGNOSIS — N186 End stage renal disease: Secondary | ICD-10-CM | POA: Diagnosis not present

## 2023-05-07 DIAGNOSIS — N186 End stage renal disease: Secondary | ICD-10-CM | POA: Diagnosis not present

## 2023-05-07 DIAGNOSIS — Z992 Dependence on renal dialysis: Secondary | ICD-10-CM | POA: Diagnosis not present

## 2023-05-09 DIAGNOSIS — Z992 Dependence on renal dialysis: Secondary | ICD-10-CM | POA: Diagnosis not present

## 2023-05-09 DIAGNOSIS — N186 End stage renal disease: Secondary | ICD-10-CM | POA: Diagnosis not present

## 2023-05-12 DIAGNOSIS — N186 End stage renal disease: Secondary | ICD-10-CM | POA: Diagnosis not present

## 2023-05-12 DIAGNOSIS — Z992 Dependence on renal dialysis: Secondary | ICD-10-CM | POA: Diagnosis not present

## 2023-05-14 DIAGNOSIS — Z992 Dependence on renal dialysis: Secondary | ICD-10-CM | POA: Diagnosis not present

## 2023-05-14 DIAGNOSIS — N186 End stage renal disease: Secondary | ICD-10-CM | POA: Diagnosis not present

## 2023-05-16 DIAGNOSIS — Z992 Dependence on renal dialysis: Secondary | ICD-10-CM | POA: Diagnosis not present

## 2023-05-16 DIAGNOSIS — N186 End stage renal disease: Secondary | ICD-10-CM | POA: Diagnosis not present

## 2023-05-19 DIAGNOSIS — N186 End stage renal disease: Secondary | ICD-10-CM | POA: Diagnosis not present

## 2023-05-19 DIAGNOSIS — Z992 Dependence on renal dialysis: Secondary | ICD-10-CM | POA: Diagnosis not present

## 2023-05-20 DIAGNOSIS — Z992 Dependence on renal dialysis: Secondary | ICD-10-CM | POA: Diagnosis not present

## 2023-05-20 DIAGNOSIS — N186 End stage renal disease: Secondary | ICD-10-CM | POA: Diagnosis not present

## 2023-05-23 DIAGNOSIS — N186 End stage renal disease: Secondary | ICD-10-CM | POA: Diagnosis not present

## 2023-05-23 DIAGNOSIS — Z79899 Other long term (current) drug therapy: Secondary | ICD-10-CM | POA: Diagnosis not present

## 2023-05-23 DIAGNOSIS — Z992 Dependence on renal dialysis: Secondary | ICD-10-CM | POA: Diagnosis not present

## 2023-05-26 DIAGNOSIS — Z992 Dependence on renal dialysis: Secondary | ICD-10-CM | POA: Diagnosis not present

## 2023-05-26 DIAGNOSIS — N186 End stage renal disease: Secondary | ICD-10-CM | POA: Diagnosis not present

## 2023-05-27 DIAGNOSIS — Z992 Dependence on renal dialysis: Secondary | ICD-10-CM | POA: Diagnosis not present

## 2023-05-27 DIAGNOSIS — N186 End stage renal disease: Secondary | ICD-10-CM | POA: Diagnosis not present

## 2023-05-28 DIAGNOSIS — N186 End stage renal disease: Secondary | ICD-10-CM | POA: Diagnosis not present

## 2023-05-28 DIAGNOSIS — Z992 Dependence on renal dialysis: Secondary | ICD-10-CM | POA: Diagnosis not present

## 2023-05-29 ENCOUNTER — Encounter (INDEPENDENT_AMBULATORY_CARE_PROVIDER_SITE_OTHER): Payer: Self-pay | Admitting: Nurse Practitioner

## 2023-05-30 DIAGNOSIS — Z992 Dependence on renal dialysis: Secondary | ICD-10-CM | POA: Diagnosis not present

## 2023-05-30 DIAGNOSIS — N186 End stage renal disease: Secondary | ICD-10-CM | POA: Diagnosis not present

## 2023-06-02 DIAGNOSIS — N186 End stage renal disease: Secondary | ICD-10-CM | POA: Diagnosis not present

## 2023-06-02 DIAGNOSIS — Z992 Dependence on renal dialysis: Secondary | ICD-10-CM | POA: Diagnosis not present

## 2023-06-04 DIAGNOSIS — Z992 Dependence on renal dialysis: Secondary | ICD-10-CM | POA: Diagnosis not present

## 2023-06-04 DIAGNOSIS — N186 End stage renal disease: Secondary | ICD-10-CM | POA: Diagnosis not present

## 2023-06-06 DIAGNOSIS — Z992 Dependence on renal dialysis: Secondary | ICD-10-CM | POA: Diagnosis not present

## 2023-06-06 DIAGNOSIS — N186 End stage renal disease: Secondary | ICD-10-CM | POA: Diagnosis not present

## 2023-06-09 DIAGNOSIS — Z992 Dependence on renal dialysis: Secondary | ICD-10-CM | POA: Diagnosis not present

## 2023-06-09 DIAGNOSIS — N186 End stage renal disease: Secondary | ICD-10-CM | POA: Diagnosis not present

## 2023-06-11 DIAGNOSIS — N186 End stage renal disease: Secondary | ICD-10-CM | POA: Diagnosis not present

## 2023-06-11 DIAGNOSIS — Z992 Dependence on renal dialysis: Secondary | ICD-10-CM | POA: Diagnosis not present

## 2023-06-12 DIAGNOSIS — R112 Nausea with vomiting, unspecified: Secondary | ICD-10-CM | POA: Diagnosis not present

## 2023-06-12 DIAGNOSIS — N186 End stage renal disease: Secondary | ICD-10-CM | POA: Diagnosis not present

## 2023-06-12 DIAGNOSIS — Z992 Dependence on renal dialysis: Secondary | ICD-10-CM | POA: Diagnosis not present

## 2023-06-13 DIAGNOSIS — N186 End stage renal disease: Secondary | ICD-10-CM | POA: Diagnosis not present

## 2023-06-13 DIAGNOSIS — Z992 Dependence on renal dialysis: Secondary | ICD-10-CM | POA: Diagnosis not present

## 2023-06-16 DIAGNOSIS — Z992 Dependence on renal dialysis: Secondary | ICD-10-CM | POA: Diagnosis not present

## 2023-06-16 DIAGNOSIS — N186 End stage renal disease: Secondary | ICD-10-CM | POA: Diagnosis not present

## 2023-06-18 DIAGNOSIS — Z992 Dependence on renal dialysis: Secondary | ICD-10-CM | POA: Diagnosis not present

## 2023-06-18 DIAGNOSIS — N186 End stage renal disease: Secondary | ICD-10-CM | POA: Diagnosis not present

## 2023-06-19 ENCOUNTER — Ambulatory Visit (INDEPENDENT_AMBULATORY_CARE_PROVIDER_SITE_OTHER): Payer: Medicare HMO | Admitting: Nurse Practitioner

## 2023-06-19 ENCOUNTER — Encounter (INDEPENDENT_AMBULATORY_CARE_PROVIDER_SITE_OTHER): Payer: Medicare HMO

## 2023-06-20 DIAGNOSIS — N186 End stage renal disease: Secondary | ICD-10-CM | POA: Diagnosis not present

## 2023-06-20 DIAGNOSIS — Z992 Dependence on renal dialysis: Secondary | ICD-10-CM | POA: Diagnosis not present

## 2023-06-23 ENCOUNTER — Other Ambulatory Visit (INDEPENDENT_AMBULATORY_CARE_PROVIDER_SITE_OTHER): Payer: Self-pay | Admitting: Vascular Surgery

## 2023-06-23 DIAGNOSIS — N186 End stage renal disease: Secondary | ICD-10-CM | POA: Diagnosis not present

## 2023-06-23 DIAGNOSIS — Z79899 Other long term (current) drug therapy: Secondary | ICD-10-CM | POA: Diagnosis not present

## 2023-06-23 DIAGNOSIS — E785 Hyperlipidemia, unspecified: Secondary | ICD-10-CM | POA: Diagnosis not present

## 2023-06-23 DIAGNOSIS — Z992 Dependence on renal dialysis: Secondary | ICD-10-CM | POA: Diagnosis not present

## 2023-06-25 DIAGNOSIS — Z992 Dependence on renal dialysis: Secondary | ICD-10-CM | POA: Diagnosis not present

## 2023-06-25 DIAGNOSIS — N186 End stage renal disease: Secondary | ICD-10-CM | POA: Diagnosis not present

## 2023-06-26 ENCOUNTER — Encounter (INDEPENDENT_AMBULATORY_CARE_PROVIDER_SITE_OTHER): Payer: Self-pay | Admitting: Nurse Practitioner

## 2023-06-26 ENCOUNTER — Ambulatory Visit (INDEPENDENT_AMBULATORY_CARE_PROVIDER_SITE_OTHER): Payer: Medicare HMO | Admitting: Nurse Practitioner

## 2023-06-26 ENCOUNTER — Ambulatory Visit (INDEPENDENT_AMBULATORY_CARE_PROVIDER_SITE_OTHER): Payer: Medicare HMO

## 2023-06-26 VITALS — BP 180/87 | HR 73 | Resp 18 | Ht 66.0 in | Wt 140.0 lb

## 2023-06-26 DIAGNOSIS — E7800 Pure hypercholesterolemia, unspecified: Secondary | ICD-10-CM | POA: Diagnosis not present

## 2023-06-26 DIAGNOSIS — N186 End stage renal disease: Secondary | ICD-10-CM

## 2023-06-26 DIAGNOSIS — I10 Essential (primary) hypertension: Secondary | ICD-10-CM | POA: Diagnosis not present

## 2023-06-27 DIAGNOSIS — N186 End stage renal disease: Secondary | ICD-10-CM | POA: Diagnosis not present

## 2023-06-27 DIAGNOSIS — Z992 Dependence on renal dialysis: Secondary | ICD-10-CM | POA: Diagnosis not present

## 2023-06-27 NOTE — Progress Notes (Signed)
Subjective:    Patient ID: James Black, male    DOB: 12/24/1955, 68 y.o.   MRN: [id number] Chief Complaint  Patient presents with   Follow-up    Follow up 6 weeks (06/12/2023); with HDA    The patient returns to the office for followup status post intervention of their dialysis access 06/26/2023.   Following the intervention the access function has significantly improved, with better flow rates and improved KT/V. The patient has not been experiencing increased bleeding times following decannulation and the patient denies increased recirculation. The patient denies an increase in arm swelling. At the present time the patient denies hand pain.  No recent shortening of the patient's walking distance or new symptoms consistent with claudication.  No history of rest pain symptoms. No new ulcers or wounds of the lower extremities have occurred.  The patient denies amaurosis fugax or recent TIA symptoms. There are no recent neurological changes noted. There is no history of DVT, PE or superficial thrombophlebitis. No recent episodes of angina or shortness of breath documented.   Duplex ultrasound of the AV access shows a patent access.  The previously noted stenosis is improved compared to last study.  Flow volume today is 2800 cc/min (previous flow volume was 2056 cc/min)       Review of Systems  Cardiovascular:  Negative for leg swelling.  Hematological:  Does not bruise/bleed easily.  All other systems reviewed and are negative.      Objective:   Physical Exam Vitals reviewed.  HENT:     Head: Normocephalic.  Cardiovascular:     Rate and Rhythm: Normal rate.     Pulses:          Radial pulses are 2+ on the right side and 2+ on the left side.     Arteriovenous access: Left arteriovenous access is present.    Comments: Good thrill and bruit Pulmonary:     Effort: Pulmonary effort is normal.  Skin:    General: Skin is warm and dry.  Neurological:     Mental Status: He is  alert and oriented to person, place, and time.  Psychiatric:        Mood and Affect: Mood normal.        Behavior: Behavior normal.        Thought Content: Thought content normal.        Judgment: Judgment normal.     BP (!) 180/87   Pulse 73   Resp 18   Ht 5\' 6"  (1.676 m)   Wt 140 lb (63.5 kg)   BMI 22.60 kg/m   Past Medical History:  Diagnosis Date   Anemia    Arthritis    Cerebral palsy (HCC)    DDD (degenerative disc disease), lumbar    Deficiency of vitamin B12    Encephalitis    ESRD (end stage renal disease) on dialysis (HCC)    M-W-F   GERD (gastroesophageal reflux disease)    Hemorrhoid    Hypercholesterolemia    Hyperkalemia    Hypertension    PONV (postoperative nausea and vomiting)    n/v after 08-16-21 capd revision   Tremor    Tubular adenoma of colon    Vitamin D deficiency     Social History   Socioeconomic History   Marital status: Single    Spouse name: Not on file   Number of children: Not on file   Years of education: Not on file   Highest education level:  Not on file  Occupational History   Not on file  Tobacco Use   Smoking status: Never    Passive exposure: Never   Smokeless tobacco: Never  Vaping Use   Vaping status: Never Used  Substance and Sexual Activity   Alcohol use: No   Drug use: No   Sexual activity: Not on file  Other Topics Concern   Not on file  Social History Narrative   Lives with niece   Social Drivers of Health   Financial Resource Strain: Not on file  Food Insecurity: No Food Insecurity (05/23/2022)   Hunger Vital Sign    Worried About Running Out of Food in the Last Year: Never true    Ran Out of Food in the Last Year: Never true  Transportation Needs: No Transportation Needs (05/23/2022)   PRAPARE - Administrator, Civil Service (Medical): No    Lack of Transportation (Non-Medical): No  Physical Activity: Not on file  Stress: Not on file  Social Connections: Not on file  Intimate Partner  Violence: Not on file    Past Surgical History:  Procedure Laterality Date   A/V FISTULAGRAM Left 05/16/2022   Procedure: A/V Fistulagram;  Surgeon: Annice Needy, MD;  Location: [facility];  Service: Cardiovascular;  Laterality: Left;   A/V FISTULAGRAM Left 08/01/2022   Procedure: A/V Fistulagram;  Surgeon: Annice Needy, MD;  Location: [facility];  Service: Cardiovascular;  Laterality: Left;   A/V FISTULAGRAM Left 05/01/2023   Procedure: A/V Fistulagram;  Surgeon: Annice Needy, MD;  Location: [facility];  Service: Cardiovascular;  Laterality: Left;   AV FISTULA PLACEMENT Left 12/06/2021   Procedure: INSERTION OF ARTERIOVENOUS (AV) GORE-TEX GRAFT ARM (BRACHIAL AXILLARY);  Surgeon: Annice Needy, MD;  Location: [facility];  Service: Vascular;  Laterality: Left;   CAPD INSERTION N/A 08/10/2020   Procedure: LAPAROSCOPIC INSERTION CONTINUOUS AMBULATORY PERITONEAL DIALYSIS  (CAPD) CATHETER;  Surgeon: Leafy Ro, MD;  Location: [facility];  Service: General;  Laterality: N/A;   CAPD REMOVAL N/A 11/08/2021   Procedure: REMOVAL CONTINUOUS AMBULATORY PERITONEAL DIALYSIS  (CAPD) CATHETER;  Surgeon: Leafy Ro, MD;  Location: [facility];  Service: General;  Laterality: N/A;   CAPD REVISION N/A 08/16/2021   Procedure: LAPAROSCOPIC REVISION CONTINUOUS AMBULATORY PERITONEAL DIALYSIS  (CAPD) CATHETER;  Surgeon: Leafy Ro, MD;  Location: [facility];  Service: General;  Laterality: N/A;   COLONOSCOPY     2003, 2011   COLONOSCOPY WITH PROPOFOL N/A 05/25/2015   Procedure: COLONOSCOPY WITH PROPOFOL;  Surgeon: Scot Jun, MD;  Location: [facility];  Service: Endoscopy;  Laterality: N/A;   COLONOSCOPY WITH PROPOFOL N/A 05/12/2020   Procedure: COLONOSCOPY WITH PROPOFOL;  Surgeon: Regis Bill, MD;  Location: [facility];  Service: Endoscopy;  Laterality: N/A;   DIALYSIS/PERMA CATHETER REMOVAL N/A 02/07/2022   Procedure: DIALYSIS/PERMA CATHETER REMOVAL;  Surgeon: Annice Needy, MD;  Location: [facility];  Service: Cardiovascular;  Laterality: N/A;   ESOPHAGOGASTRODUODENOSCOPY     2003, 2011   ESOPHAGOGASTRODUODENOSCOPY (EGD) WITH PROPOFOL N/A 05/12/2020   Procedure: ESOPHAGOGASTRODUODENOSCOPY (EGD) WITH PROPOFOL;  Surgeon: Regis Bill, MD;  Location: [facility];  Service: Endoscopy;  Laterality: N/A;   FRACTURE SURGERY Left 1997   arm   XI ROBOTIC ASSISTED INGUINAL HERNIA REPAIR WITH MESH Right 08/23/2021   Procedure: XI ROBOTIC ASSISTED INGUINAL HERNIA REPAIR WITH MESH;  Surgeon: Leafy Ro, MD;  Location: [facility];  Service: General;  Laterality: Right;    History reviewed. No pertinent family history.  Allergies  Allergen Reactions   Cefuroxime Rash    TOLERATED CEFAZOLIN   Tramadol Nausea Only and Other (See Comments)    Dizziness       Latest Ref Rng & Units 04/30/2023   10:41 AM 06/17/2022    2:04 PM 12/15/2021    5:00 AM  CBC  WBC 4.0 - 10.5 K/uL 5.2  5.6  8.0   Hemoglobin 13.0 - 17.0 g/dL 16.1  09.6  04.5   Hematocrit 39.0 - 52.0 % 38.3  32.2  34.7   Platelets 150 - 400 K/uL 173  184  220       CMP     Component Value Date/Time   NA 138 04/30/2023 1041   K 3.3 (L) 04/30/2023 1041   CL 97 (L) 04/30/2023 1041   CO2 27 04/30/2023 1041   GLUCOSE 83 04/30/2023 1041   BUN 24 (H) 04/30/2023 1041   CREATININE 5.43 (H) 04/30/2023 1041   CALCIUM 9.6 04/30/2023 1041   PROT 8.2 (H) 04/30/2023 1041   ALBUMIN 4.6 04/30/2023 1041   AST 22 04/30/2023 1041   ALT 12 04/30/2023 1041   ALKPHOS 109 04/30/2023 1041   BILITOT 1.9 (H) 04/30/2023 1041   GFRNONAA 11 (L) 04/30/2023 1041     No results found.     Assessment & Plan:   1. ESRD (end stage renal disease) (HCC) (Primary) Recommend:  The patient is doing well and currently has adequate dialysis access. The patient's dialysis center is not reporting any access issues. Flow pattern is stable when compared to the prior ultrasound.  The patient should  have a duplex ultrasound of the dialysis access in 6 months. The patient will follow-up with me in the office after each ultrasound    2. Benign essential hypertension Continue antihypertensive medications as already ordered, these medications have been reviewed and there are no changes at this time.  3. Hypercholesterolemia  Continue statin as ordered and reviewed, no changes at this time  Current Outpatient Medications on File Prior to Visit  Medication Sig Dispense Refill   acetaminophen (TYLENOL) 325 MG tablet Take 650 mg by mouth every 6 (six) hours as needed for mild pain.     amLODipine (NORVASC) 5 MG tablet Take 5 mg by mouth every morning.     atorvastatin (LIPITOR) 10 MG tablet Take 10 mg by mouth every morning.     calcium acetate (PHOSLO) 667 MG capsule Take 667 mg by mouth 3 (three) times daily.     Cholecalciferol 5000 units TABS Take 5,000 Units by mouth once a week.     dorzolamide-timolol (COSOPT) 22.3-6.8 MG/ML ophthalmic solution Place 1 drop into both eyes 2 (two) times daily.     fluticasone (FLONASE) 50 MCG/ACT nasal spray Place 2 sprays into both nostrils daily as needed for allergies or rhinitis.     furosemide (LASIX) 80 MG tablet Take 80 mg by mouth daily.     hydrALAZINE (APRESOLINE) 25 MG tablet Take 1 tablet (25 mg total) by mouth every 8 (eight) hours. 90 tablet 2   lactulose (CHRONULAC) 10 GM/15ML solution Take 20 g by mouth 2 (two) times daily.     lidocaine-prilocaine (EMLA) cream Apply topically daily.     loratadine (CLARITIN) 10 MG tablet Take 10 mg by mouth daily.     losartan (COZAAR) 50 MG tablet Take 50 mg by mouth at bedtime.  metoprolol tartrate (LOPRESSOR) 25 MG tablet Take 25 mg by mouth every morning.     Netarsudil-Latanoprost (ROCKLATAN) 0.02-0.005 % SOLN Place 1 drop into both eyes every evening.     pantoprazole (PROTONIX) 20 MG tablet Take 20 mg by mouth every morning.     APIXABAN (ELIQUIS) VTE STARTER PACK (10MG  AND 5MG ) Take as  directed on package: start with two-5mg  tablets twice daily for 7 days. (Already been on since 7/20.)  On day 7/27, switch to one-5mg  tablet twice daily. 1 each 0   guaiFENesin-dextromethorphan (ROBITUSSIN DM) 100-10 MG/5ML syrup Take 5 mLs by mouth every 4 (four) hours as needed for cough. (Patient not taking: Reported on 05/16/2022) 118 mL 0   HYDROcodone-acetaminophen (NORCO/VICODIN) 5-325 MG tablet Take 1-2 tablets by mouth every 6 (six) hours as needed for moderate pain. (Patient not taking: Reported on 05/02/2022) 12 tablet 0   No current facility-administered medications on file prior to visit.    There are no Patient Instructions on file for this visit. No follow-ups on file.   Georgiana Spinner, NP

## 2023-06-28 DIAGNOSIS — Z992 Dependence on renal dialysis: Secondary | ICD-10-CM | POA: Diagnosis not present

## 2023-06-28 DIAGNOSIS — N186 End stage renal disease: Secondary | ICD-10-CM | POA: Diagnosis not present

## 2023-06-30 DIAGNOSIS — Z992 Dependence on renal dialysis: Secondary | ICD-10-CM | POA: Diagnosis not present

## 2023-06-30 DIAGNOSIS — N186 End stage renal disease: Secondary | ICD-10-CM | POA: Diagnosis not present

## 2023-07-02 DIAGNOSIS — Z992 Dependence on renal dialysis: Secondary | ICD-10-CM | POA: Diagnosis not present

## 2023-07-02 DIAGNOSIS — N186 End stage renal disease: Secondary | ICD-10-CM | POA: Diagnosis not present

## 2023-07-04 DIAGNOSIS — N186 End stage renal disease: Secondary | ICD-10-CM | POA: Diagnosis not present

## 2023-07-04 DIAGNOSIS — Z992 Dependence on renal dialysis: Secondary | ICD-10-CM | POA: Diagnosis not present

## 2023-07-07 DIAGNOSIS — N186 End stage renal disease: Secondary | ICD-10-CM | POA: Diagnosis not present

## 2023-07-07 DIAGNOSIS — Z992 Dependence on renal dialysis: Secondary | ICD-10-CM | POA: Diagnosis not present

## 2023-07-09 DIAGNOSIS — Z992 Dependence on renal dialysis: Secondary | ICD-10-CM | POA: Diagnosis not present

## 2023-07-09 DIAGNOSIS — N186 End stage renal disease: Secondary | ICD-10-CM | POA: Diagnosis not present

## 2023-07-11 DIAGNOSIS — Z992 Dependence on renal dialysis: Secondary | ICD-10-CM | POA: Diagnosis not present

## 2023-07-11 DIAGNOSIS — N186 End stage renal disease: Secondary | ICD-10-CM | POA: Diagnosis not present

## 2023-07-14 DIAGNOSIS — Z992 Dependence on renal dialysis: Secondary | ICD-10-CM | POA: Diagnosis not present

## 2023-07-14 DIAGNOSIS — N186 End stage renal disease: Secondary | ICD-10-CM | POA: Diagnosis not present

## 2023-07-16 DIAGNOSIS — Z992 Dependence on renal dialysis: Secondary | ICD-10-CM | POA: Diagnosis not present

## 2023-07-16 DIAGNOSIS — N186 End stage renal disease: Secondary | ICD-10-CM | POA: Diagnosis not present

## 2023-07-18 DIAGNOSIS — N186 End stage renal disease: Secondary | ICD-10-CM | POA: Diagnosis not present

## 2023-07-18 DIAGNOSIS — Z992 Dependence on renal dialysis: Secondary | ICD-10-CM | POA: Diagnosis not present

## 2023-07-21 DIAGNOSIS — Z992 Dependence on renal dialysis: Secondary | ICD-10-CM | POA: Diagnosis not present

## 2023-07-21 DIAGNOSIS — Z79899 Other long term (current) drug therapy: Secondary | ICD-10-CM | POA: Diagnosis not present

## 2023-07-21 DIAGNOSIS — N186 End stage renal disease: Secondary | ICD-10-CM | POA: Diagnosis not present

## 2023-07-23 DIAGNOSIS — N186 End stage renal disease: Secondary | ICD-10-CM | POA: Diagnosis not present

## 2023-07-23 DIAGNOSIS — Z992 Dependence on renal dialysis: Secondary | ICD-10-CM | POA: Diagnosis not present

## 2023-07-25 DIAGNOSIS — Z992 Dependence on renal dialysis: Secondary | ICD-10-CM | POA: Diagnosis not present

## 2023-07-25 DIAGNOSIS — N186 End stage renal disease: Secondary | ICD-10-CM | POA: Diagnosis not present

## 2023-07-26 DIAGNOSIS — N186 End stage renal disease: Secondary | ICD-10-CM | POA: Diagnosis not present

## 2023-07-26 DIAGNOSIS — Z992 Dependence on renal dialysis: Secondary | ICD-10-CM | POA: Diagnosis not present

## 2023-07-28 DIAGNOSIS — N186 End stage renal disease: Secondary | ICD-10-CM | POA: Diagnosis not present

## 2023-07-28 DIAGNOSIS — Z992 Dependence on renal dialysis: Secondary | ICD-10-CM | POA: Diagnosis not present

## 2023-07-30 DIAGNOSIS — Z992 Dependence on renal dialysis: Secondary | ICD-10-CM | POA: Diagnosis not present

## 2023-07-30 DIAGNOSIS — N186 End stage renal disease: Secondary | ICD-10-CM | POA: Diagnosis not present

## 2023-07-31 DIAGNOSIS — K219 Gastro-esophageal reflux disease without esophagitis: Secondary | ICD-10-CM | POA: Diagnosis not present

## 2023-07-31 DIAGNOSIS — G809 Cerebral palsy, unspecified: Secondary | ICD-10-CM | POA: Diagnosis not present

## 2023-07-31 DIAGNOSIS — I129 Hypertensive chronic kidney disease with stage 1 through stage 4 chronic kidney disease, or unspecified chronic kidney disease: Secondary | ICD-10-CM | POA: Diagnosis not present

## 2023-07-31 DIAGNOSIS — N186 End stage renal disease: Secondary | ICD-10-CM | POA: Diagnosis not present

## 2023-07-31 DIAGNOSIS — D631 Anemia in chronic kidney disease: Secondary | ICD-10-CM | POA: Diagnosis not present

## 2023-07-31 DIAGNOSIS — N2581 Secondary hyperparathyroidism of renal origin: Secondary | ICD-10-CM | POA: Diagnosis not present

## 2023-07-31 DIAGNOSIS — Z992 Dependence on renal dialysis: Secondary | ICD-10-CM | POA: Diagnosis not present

## 2023-07-31 DIAGNOSIS — Z0000 Encounter for general adult medical examination without abnormal findings: Secondary | ICD-10-CM | POA: Diagnosis not present

## 2023-08-01 DIAGNOSIS — N186 End stage renal disease: Secondary | ICD-10-CM | POA: Diagnosis not present

## 2023-08-01 DIAGNOSIS — Z992 Dependence on renal dialysis: Secondary | ICD-10-CM | POA: Diagnosis not present

## 2023-08-04 DIAGNOSIS — Z992 Dependence on renal dialysis: Secondary | ICD-10-CM | POA: Diagnosis not present

## 2023-08-04 DIAGNOSIS — N186 End stage renal disease: Secondary | ICD-10-CM | POA: Diagnosis not present

## 2023-08-06 DIAGNOSIS — N186 End stage renal disease: Secondary | ICD-10-CM | POA: Diagnosis not present

## 2023-08-06 DIAGNOSIS — Z992 Dependence on renal dialysis: Secondary | ICD-10-CM | POA: Diagnosis not present

## 2023-08-08 DIAGNOSIS — Z992 Dependence on renal dialysis: Secondary | ICD-10-CM | POA: Diagnosis not present

## 2023-08-08 DIAGNOSIS — N186 End stage renal disease: Secondary | ICD-10-CM | POA: Diagnosis not present

## 2023-08-11 DIAGNOSIS — Z992 Dependence on renal dialysis: Secondary | ICD-10-CM | POA: Diagnosis not present

## 2023-08-11 DIAGNOSIS — N186 End stage renal disease: Secondary | ICD-10-CM | POA: Diagnosis not present

## 2023-08-13 DIAGNOSIS — N186 End stage renal disease: Secondary | ICD-10-CM | POA: Diagnosis not present

## 2023-08-13 DIAGNOSIS — Z992 Dependence on renal dialysis: Secondary | ICD-10-CM | POA: Diagnosis not present

## 2023-08-15 DIAGNOSIS — N186 End stage renal disease: Secondary | ICD-10-CM | POA: Diagnosis not present

## 2023-08-15 DIAGNOSIS — Z992 Dependence on renal dialysis: Secondary | ICD-10-CM | POA: Diagnosis not present

## 2023-08-18 DIAGNOSIS — Z992 Dependence on renal dialysis: Secondary | ICD-10-CM | POA: Diagnosis not present

## 2023-08-18 DIAGNOSIS — N186 End stage renal disease: Secondary | ICD-10-CM | POA: Diagnosis not present

## 2023-08-18 DIAGNOSIS — Z79899 Other long term (current) drug therapy: Secondary | ICD-10-CM | POA: Diagnosis not present

## 2023-08-20 DIAGNOSIS — Z992 Dependence on renal dialysis: Secondary | ICD-10-CM | POA: Diagnosis not present

## 2023-08-20 DIAGNOSIS — N186 End stage renal disease: Secondary | ICD-10-CM | POA: Diagnosis not present

## 2023-08-22 DIAGNOSIS — Z992 Dependence on renal dialysis: Secondary | ICD-10-CM | POA: Diagnosis not present

## 2023-08-22 DIAGNOSIS — N186 End stage renal disease: Secondary | ICD-10-CM | POA: Diagnosis not present

## 2023-08-25 DIAGNOSIS — N186 End stage renal disease: Secondary | ICD-10-CM | POA: Diagnosis not present

## 2023-08-25 DIAGNOSIS — Z992 Dependence on renal dialysis: Secondary | ICD-10-CM | POA: Diagnosis not present

## 2023-08-26 DIAGNOSIS — N186 End stage renal disease: Secondary | ICD-10-CM | POA: Diagnosis not present

## 2023-08-26 DIAGNOSIS — Z992 Dependence on renal dialysis: Secondary | ICD-10-CM | POA: Diagnosis not present

## 2023-08-27 DIAGNOSIS — N186 End stage renal disease: Secondary | ICD-10-CM | POA: Diagnosis not present

## 2023-08-27 DIAGNOSIS — Z992 Dependence on renal dialysis: Secondary | ICD-10-CM | POA: Diagnosis not present

## 2023-08-29 DIAGNOSIS — Z992 Dependence on renal dialysis: Secondary | ICD-10-CM | POA: Diagnosis not present

## 2023-08-29 DIAGNOSIS — N186 End stage renal disease: Secondary | ICD-10-CM | POA: Diagnosis not present

## 2023-09-01 DIAGNOSIS — Z992 Dependence on renal dialysis: Secondary | ICD-10-CM | POA: Diagnosis not present

## 2023-09-01 DIAGNOSIS — N186 End stage renal disease: Secondary | ICD-10-CM | POA: Diagnosis not present

## 2023-09-03 DIAGNOSIS — Z992 Dependence on renal dialysis: Secondary | ICD-10-CM | POA: Diagnosis not present

## 2023-09-03 DIAGNOSIS — N186 End stage renal disease: Secondary | ICD-10-CM | POA: Diagnosis not present

## 2023-09-05 DIAGNOSIS — N186 End stage renal disease: Secondary | ICD-10-CM | POA: Diagnosis not present

## 2023-09-05 DIAGNOSIS — Z992 Dependence on renal dialysis: Secondary | ICD-10-CM | POA: Diagnosis not present

## 2023-09-08 DIAGNOSIS — N186 End stage renal disease: Secondary | ICD-10-CM | POA: Diagnosis not present

## 2023-09-08 DIAGNOSIS — Z992 Dependence on renal dialysis: Secondary | ICD-10-CM | POA: Diagnosis not present

## 2023-09-10 DIAGNOSIS — Z992 Dependence on renal dialysis: Secondary | ICD-10-CM | POA: Diagnosis not present

## 2023-09-10 DIAGNOSIS — N186 End stage renal disease: Secondary | ICD-10-CM | POA: Diagnosis not present

## 2023-09-12 DIAGNOSIS — Z992 Dependence on renal dialysis: Secondary | ICD-10-CM | POA: Diagnosis not present

## 2023-09-12 DIAGNOSIS — N186 End stage renal disease: Secondary | ICD-10-CM | POA: Diagnosis not present

## 2023-09-15 DIAGNOSIS — Z992 Dependence on renal dialysis: Secondary | ICD-10-CM | POA: Diagnosis not present

## 2023-09-15 DIAGNOSIS — N186 End stage renal disease: Secondary | ICD-10-CM | POA: Diagnosis not present

## 2023-09-17 DIAGNOSIS — Z992 Dependence on renal dialysis: Secondary | ICD-10-CM | POA: Diagnosis not present

## 2023-09-17 DIAGNOSIS — N186 End stage renal disease: Secondary | ICD-10-CM | POA: Diagnosis not present

## 2023-09-19 DIAGNOSIS — Z992 Dependence on renal dialysis: Secondary | ICD-10-CM | POA: Diagnosis not present

## 2023-09-19 DIAGNOSIS — N186 End stage renal disease: Secondary | ICD-10-CM | POA: Diagnosis not present

## 2023-09-22 DIAGNOSIS — N186 End stage renal disease: Secondary | ICD-10-CM | POA: Diagnosis not present

## 2023-09-22 DIAGNOSIS — Z992 Dependence on renal dialysis: Secondary | ICD-10-CM | POA: Diagnosis not present

## 2023-09-24 DIAGNOSIS — N186 End stage renal disease: Secondary | ICD-10-CM | POA: Diagnosis not present

## 2023-09-24 DIAGNOSIS — Z992 Dependence on renal dialysis: Secondary | ICD-10-CM | POA: Diagnosis not present

## 2023-09-25 DIAGNOSIS — Z992 Dependence on renal dialysis: Secondary | ICD-10-CM | POA: Diagnosis not present

## 2023-09-25 DIAGNOSIS — N186 End stage renal disease: Secondary | ICD-10-CM | POA: Diagnosis not present

## 2023-09-26 DIAGNOSIS — N186 End stage renal disease: Secondary | ICD-10-CM | POA: Diagnosis not present

## 2023-09-26 DIAGNOSIS — Z992 Dependence on renal dialysis: Secondary | ICD-10-CM | POA: Diagnosis not present

## 2023-09-29 DIAGNOSIS — N186 End stage renal disease: Secondary | ICD-10-CM | POA: Diagnosis not present

## 2023-09-29 DIAGNOSIS — Z992 Dependence on renal dialysis: Secondary | ICD-10-CM | POA: Diagnosis not present

## 2023-10-01 DIAGNOSIS — Z992 Dependence on renal dialysis: Secondary | ICD-10-CM | POA: Diagnosis not present

## 2023-10-01 DIAGNOSIS — Z79899 Other long term (current) drug therapy: Secondary | ICD-10-CM | POA: Diagnosis not present

## 2023-10-01 DIAGNOSIS — N186 End stage renal disease: Secondary | ICD-10-CM | POA: Diagnosis not present

## 2023-10-03 DIAGNOSIS — Z992 Dependence on renal dialysis: Secondary | ICD-10-CM | POA: Diagnosis not present

## 2023-10-03 DIAGNOSIS — N186 End stage renal disease: Secondary | ICD-10-CM | POA: Diagnosis not present

## 2023-10-06 DIAGNOSIS — Z992 Dependence on renal dialysis: Secondary | ICD-10-CM | POA: Diagnosis not present

## 2023-10-06 DIAGNOSIS — N186 End stage renal disease: Secondary | ICD-10-CM | POA: Diagnosis not present

## 2023-10-08 DIAGNOSIS — N186 End stage renal disease: Secondary | ICD-10-CM | POA: Diagnosis not present

## 2023-10-08 DIAGNOSIS — Z992 Dependence on renal dialysis: Secondary | ICD-10-CM | POA: Diagnosis not present

## 2023-10-10 DIAGNOSIS — Z992 Dependence on renal dialysis: Secondary | ICD-10-CM | POA: Diagnosis not present

## 2023-10-10 DIAGNOSIS — N186 End stage renal disease: Secondary | ICD-10-CM | POA: Diagnosis not present

## 2023-10-13 DIAGNOSIS — N186 End stage renal disease: Secondary | ICD-10-CM | POA: Diagnosis not present

## 2023-10-13 DIAGNOSIS — Z992 Dependence on renal dialysis: Secondary | ICD-10-CM | POA: Diagnosis not present

## 2023-10-15 DIAGNOSIS — N186 End stage renal disease: Secondary | ICD-10-CM | POA: Diagnosis not present

## 2023-10-15 DIAGNOSIS — Z992 Dependence on renal dialysis: Secondary | ICD-10-CM | POA: Diagnosis not present

## 2023-10-17 DIAGNOSIS — Z992 Dependence on renal dialysis: Secondary | ICD-10-CM | POA: Diagnosis not present

## 2023-10-17 DIAGNOSIS — N186 End stage renal disease: Secondary | ICD-10-CM | POA: Diagnosis not present

## 2023-10-20 DIAGNOSIS — Z992 Dependence on renal dialysis: Secondary | ICD-10-CM | POA: Diagnosis not present

## 2023-10-20 DIAGNOSIS — N186 End stage renal disease: Secondary | ICD-10-CM | POA: Diagnosis not present

## 2023-10-22 DIAGNOSIS — Z992 Dependence on renal dialysis: Secondary | ICD-10-CM | POA: Diagnosis not present

## 2023-10-22 DIAGNOSIS — N186 End stage renal disease: Secondary | ICD-10-CM | POA: Diagnosis not present

## 2023-10-24 DIAGNOSIS — Z992 Dependence on renal dialysis: Secondary | ICD-10-CM | POA: Diagnosis not present

## 2023-10-24 DIAGNOSIS — N186 End stage renal disease: Secondary | ICD-10-CM | POA: Diagnosis not present

## 2023-10-25 DIAGNOSIS — N186 End stage renal disease: Secondary | ICD-10-CM | POA: Diagnosis not present

## 2023-10-25 DIAGNOSIS — Z992 Dependence on renal dialysis: Secondary | ICD-10-CM | POA: Diagnosis not present

## 2023-10-26 DIAGNOSIS — Z992 Dependence on renal dialysis: Secondary | ICD-10-CM | POA: Diagnosis not present

## 2023-10-26 DIAGNOSIS — N186 End stage renal disease: Secondary | ICD-10-CM | POA: Diagnosis not present

## 2023-10-27 DIAGNOSIS — N186 End stage renal disease: Secondary | ICD-10-CM | POA: Diagnosis not present

## 2023-10-27 DIAGNOSIS — Z992 Dependence on renal dialysis: Secondary | ICD-10-CM | POA: Diagnosis not present

## 2023-10-28 DIAGNOSIS — N186 End stage renal disease: Secondary | ICD-10-CM | POA: Diagnosis not present

## 2023-10-28 DIAGNOSIS — Z992 Dependence on renal dialysis: Secondary | ICD-10-CM | POA: Diagnosis not present

## 2023-10-29 DIAGNOSIS — N186 End stage renal disease: Secondary | ICD-10-CM | POA: Diagnosis not present

## 2023-10-29 DIAGNOSIS — Z992 Dependence on renal dialysis: Secondary | ICD-10-CM | POA: Diagnosis not present

## 2023-10-31 DIAGNOSIS — N186 End stage renal disease: Secondary | ICD-10-CM | POA: Diagnosis not present

## 2023-10-31 DIAGNOSIS — Z992 Dependence on renal dialysis: Secondary | ICD-10-CM | POA: Diagnosis not present

## 2023-11-03 DIAGNOSIS — N186 End stage renal disease: Secondary | ICD-10-CM | POA: Diagnosis not present

## 2023-11-03 DIAGNOSIS — Z992 Dependence on renal dialysis: Secondary | ICD-10-CM | POA: Diagnosis not present

## 2023-11-05 DIAGNOSIS — N186 End stage renal disease: Secondary | ICD-10-CM | POA: Diagnosis not present

## 2023-11-05 DIAGNOSIS — Z992 Dependence on renal dialysis: Secondary | ICD-10-CM | POA: Diagnosis not present

## 2023-11-07 DIAGNOSIS — Z992 Dependence on renal dialysis: Secondary | ICD-10-CM | POA: Diagnosis not present

## 2023-11-07 DIAGNOSIS — N186 End stage renal disease: Secondary | ICD-10-CM | POA: Diagnosis not present

## 2023-11-10 DIAGNOSIS — N186 End stage renal disease: Secondary | ICD-10-CM | POA: Diagnosis not present

## 2023-11-10 DIAGNOSIS — Z992 Dependence on renal dialysis: Secondary | ICD-10-CM | POA: Diagnosis not present

## 2023-11-12 DIAGNOSIS — N186 End stage renal disease: Secondary | ICD-10-CM | POA: Diagnosis not present

## 2023-11-12 DIAGNOSIS — Z992 Dependence on renal dialysis: Secondary | ICD-10-CM | POA: Diagnosis not present

## 2023-11-14 DIAGNOSIS — Z992 Dependence on renal dialysis: Secondary | ICD-10-CM | POA: Diagnosis not present

## 2023-11-14 DIAGNOSIS — N186 End stage renal disease: Secondary | ICD-10-CM | POA: Diagnosis not present

## 2023-11-17 DIAGNOSIS — N186 End stage renal disease: Secondary | ICD-10-CM | POA: Diagnosis not present

## 2023-11-17 DIAGNOSIS — Z992 Dependence on renal dialysis: Secondary | ICD-10-CM | POA: Diagnosis not present

## 2023-11-19 DIAGNOSIS — Z992 Dependence on renal dialysis: Secondary | ICD-10-CM | POA: Diagnosis not present

## 2023-11-19 DIAGNOSIS — N186 End stage renal disease: Secondary | ICD-10-CM | POA: Diagnosis not present

## 2023-11-21 DIAGNOSIS — N186 End stage renal disease: Secondary | ICD-10-CM | POA: Diagnosis not present

## 2023-11-21 DIAGNOSIS — Z992 Dependence on renal dialysis: Secondary | ICD-10-CM | POA: Diagnosis not present

## 2023-11-24 DIAGNOSIS — Z992 Dependence on renal dialysis: Secondary | ICD-10-CM | POA: Diagnosis not present

## 2023-11-24 DIAGNOSIS — N186 End stage renal disease: Secondary | ICD-10-CM | POA: Diagnosis not present

## 2023-11-25 DIAGNOSIS — N186 End stage renal disease: Secondary | ICD-10-CM | POA: Diagnosis not present

## 2023-11-25 DIAGNOSIS — Z992 Dependence on renal dialysis: Secondary | ICD-10-CM | POA: Diagnosis not present

## 2023-11-26 DIAGNOSIS — Z992 Dependence on renal dialysis: Secondary | ICD-10-CM | POA: Diagnosis not present

## 2023-11-26 DIAGNOSIS — N186 End stage renal disease: Secondary | ICD-10-CM | POA: Diagnosis not present

## 2023-11-28 DIAGNOSIS — N186 End stage renal disease: Secondary | ICD-10-CM | POA: Diagnosis not present

## 2023-11-28 DIAGNOSIS — Z992 Dependence on renal dialysis: Secondary | ICD-10-CM | POA: Diagnosis not present

## 2023-12-02 DIAGNOSIS — N186 End stage renal disease: Secondary | ICD-10-CM | POA: Diagnosis not present

## 2023-12-02 DIAGNOSIS — Z992 Dependence on renal dialysis: Secondary | ICD-10-CM | POA: Diagnosis not present

## 2023-12-03 DIAGNOSIS — N186 End stage renal disease: Secondary | ICD-10-CM | POA: Diagnosis not present

## 2023-12-03 DIAGNOSIS — Z992 Dependence on renal dialysis: Secondary | ICD-10-CM | POA: Diagnosis not present

## 2023-12-05 DIAGNOSIS — N186 End stage renal disease: Secondary | ICD-10-CM | POA: Diagnosis not present

## 2023-12-05 DIAGNOSIS — Z992 Dependence on renal dialysis: Secondary | ICD-10-CM | POA: Diagnosis not present

## 2023-12-08 DIAGNOSIS — Z992 Dependence on renal dialysis: Secondary | ICD-10-CM | POA: Diagnosis not present

## 2023-12-08 DIAGNOSIS — N186 End stage renal disease: Secondary | ICD-10-CM | POA: Diagnosis not present

## 2023-12-10 DIAGNOSIS — Z992 Dependence on renal dialysis: Secondary | ICD-10-CM | POA: Diagnosis not present

## 2023-12-10 DIAGNOSIS — N186 End stage renal disease: Secondary | ICD-10-CM | POA: Diagnosis not present

## 2023-12-12 DIAGNOSIS — N186 End stage renal disease: Secondary | ICD-10-CM | POA: Diagnosis not present

## 2023-12-12 DIAGNOSIS — Z992 Dependence on renal dialysis: Secondary | ICD-10-CM | POA: Diagnosis not present

## 2023-12-15 DIAGNOSIS — Z992 Dependence on renal dialysis: Secondary | ICD-10-CM | POA: Diagnosis not present

## 2023-12-15 DIAGNOSIS — N186 End stage renal disease: Secondary | ICD-10-CM | POA: Diagnosis not present

## 2023-12-17 DIAGNOSIS — N186 End stage renal disease: Secondary | ICD-10-CM | POA: Diagnosis not present

## 2023-12-17 DIAGNOSIS — Z992 Dependence on renal dialysis: Secondary | ICD-10-CM | POA: Diagnosis not present

## 2023-12-19 DIAGNOSIS — N186 End stage renal disease: Secondary | ICD-10-CM | POA: Diagnosis not present

## 2023-12-19 DIAGNOSIS — Z992 Dependence on renal dialysis: Secondary | ICD-10-CM | POA: Diagnosis not present

## 2023-12-22 DIAGNOSIS — E785 Hyperlipidemia, unspecified: Secondary | ICD-10-CM | POA: Diagnosis not present

## 2023-12-22 DIAGNOSIS — Z992 Dependence on renal dialysis: Secondary | ICD-10-CM | POA: Diagnosis not present

## 2023-12-22 DIAGNOSIS — N186 End stage renal disease: Secondary | ICD-10-CM | POA: Diagnosis not present

## 2023-12-24 DIAGNOSIS — N186 End stage renal disease: Secondary | ICD-10-CM | POA: Diagnosis not present

## 2023-12-24 DIAGNOSIS — Z992 Dependence on renal dialysis: Secondary | ICD-10-CM | POA: Diagnosis not present

## 2023-12-25 ENCOUNTER — Encounter (INDEPENDENT_AMBULATORY_CARE_PROVIDER_SITE_OTHER): Payer: Medicare HMO

## 2023-12-25 ENCOUNTER — Ambulatory Visit (INDEPENDENT_AMBULATORY_CARE_PROVIDER_SITE_OTHER): Payer: Medicare HMO | Admitting: Nurse Practitioner

## 2023-12-25 DIAGNOSIS — N186 End stage renal disease: Secondary | ICD-10-CM | POA: Diagnosis not present

## 2023-12-25 DIAGNOSIS — Z992 Dependence on renal dialysis: Secondary | ICD-10-CM | POA: Diagnosis not present

## 2024-01-28 ENCOUNTER — Other Ambulatory Visit (INDEPENDENT_AMBULATORY_CARE_PROVIDER_SITE_OTHER): Payer: Self-pay | Admitting: Vascular Surgery

## 2024-01-28 DIAGNOSIS — N186 End stage renal disease: Secondary | ICD-10-CM

## 2024-01-29 ENCOUNTER — Ambulatory Visit (INDEPENDENT_AMBULATORY_CARE_PROVIDER_SITE_OTHER): Admitting: Nurse Practitioner

## 2024-01-29 ENCOUNTER — Encounter (INDEPENDENT_AMBULATORY_CARE_PROVIDER_SITE_OTHER): Payer: Self-pay | Admitting: Nurse Practitioner

## 2024-01-29 ENCOUNTER — Ambulatory Visit (INDEPENDENT_AMBULATORY_CARE_PROVIDER_SITE_OTHER)

## 2024-01-29 VITALS — BP 167/81 | HR 61 | Ht 66.0 in | Wt 146.0 lb

## 2024-01-29 DIAGNOSIS — E7800 Pure hypercholesterolemia, unspecified: Secondary | ICD-10-CM | POA: Diagnosis not present

## 2024-01-29 DIAGNOSIS — N186 End stage renal disease: Secondary | ICD-10-CM

## 2024-01-29 DIAGNOSIS — I10 Essential (primary) hypertension: Secondary | ICD-10-CM

## 2024-02-01 ENCOUNTER — Encounter (INDEPENDENT_AMBULATORY_CARE_PROVIDER_SITE_OTHER): Payer: Self-pay | Admitting: Nurse Practitioner

## 2024-02-01 NOTE — Progress Notes (Signed)
 Subjective:    Patient ID: James Black, male    DOB: [birth year], 68 y.o.   MRN: [id number] Chief Complaint  Patient presents with   Follow-up    The patient returns to the office for followup status post intervention of their dialysis access 06/26/2023.   Following the intervention the access function has significantly improved, with better flow rates and improved KT/V. The patient has not been experiencing increased bleeding times following decannulation and the patient denies increased recirculation. The patient denies an increase in arm swelling. At the present time the patient denies hand pain.  No recent shortening of the patient's walking distance or new symptoms consistent with claudication.  No history of rest pain symptoms. No new ulcers or wounds of the lower extremities have occurred.  The patient denies amaurosis fugax or recent TIA symptoms. There are no recent neurological changes noted. There is no history of DVT, PE or superficial thrombophlebitis. No recent episodes of angina or shortness of breath documented.   Duplex ultrasound of the AV access shows a patent access.  The previously noted stenosis is improved compared to last study.  Flow volume today is 2926 cc/min (previous flow volume was 2800 cc/min)       Review of Systems  Cardiovascular:  Negative for leg swelling.  Hematological:  Does not bruise/bleed easily.  All other systems reviewed and are negative.      Objective:   Physical Exam Vitals reviewed.  HENT:     Head: Normocephalic.  Cardiovascular:     Rate and Rhythm: Normal rate.     Pulses:          Radial pulses are 2+ on the right side and 2+ on the left side.     Arteriovenous access: Left arteriovenous access is present.     Comments: Good thrill and bruit Pulmonary:     Effort: Pulmonary effort is normal.  Skin:    General: Skin is warm and dry.  Neurological:     Mental Status: He is alert and oriented to person, place, and  time.  Psychiatric:        Mood and Affect: Mood normal.        Behavior: Behavior normal.        Thought Content: Thought content normal.        Judgment: Judgment normal.     BP (!) 167/81   Pulse 61   Ht 5' 6 (1.676 m)   Wt 146 lb (66.2 kg)   BMI 23.57 kg/m   Past Medical History:  Diagnosis Date   Anemia    Arthritis    Cerebral palsy (HCC)    DDD (degenerative disc disease), lumbar    Deficiency of vitamin B12    Encephalitis    ESRD (end stage renal disease) on dialysis (HCC)    M-W-F   GERD (gastroesophageal reflux disease)    Hemorrhoid    Hypercholesterolemia    Hyperkalemia    Hypertension    PONV (postoperative nausea and vomiting)    n/v after 08-16-21 capd revision   Tremor    Tubular adenoma of colon    Vitamin D  deficiency     Social History   Socioeconomic History   Marital status: Single    Spouse name: Not on file   Number of children: Not on file   Years of education: Not on file   Highest education level: Not on file  Occupational History   Not on file  Tobacco  Use   Smoking status: Never    Passive exposure: Never   Smokeless tobacco: Never  Vaping Use   Vaping status: Never Used  Substance and Sexual Activity   Alcohol use: No   Drug use: No   Sexual activity: Not on file  Other Topics Concern   Not on file  Social History Narrative   Lives with niece   Social Drivers of Health   Financial Resource Strain: Low Risk  (07/31/2023)   Received from [facility]   Overall Financial Resource Strain (CARDIA)    Difficulty of Paying Living Expenses: Not hard at all  Food Insecurity: No Food Insecurity (07/31/2023)   Received from [facility]   Hunger Vital Sign    Within the past 12 months, you worried that your food would run out before you got the money to buy more.: Never true    Within the past 12 months, the food you bought just didn't last and you didn't have money to get more.: Never true   Transportation Needs: No Transportation Needs (07/31/2023)   Received from [facility] - Transportation    In the past 12 months, has lack of transportation kept you from medical appointments or from getting medications?: No    Lack of Transportation (Non-Medical): No  Physical Activity: Not on file  Stress: Not on file  Social Connections: Not on file  Intimate Partner Violence: Not on file    Past Surgical History:  Procedure Laterality Date   A/V FISTULAGRAM Left 05/16/2022   Procedure: A/V Fistulagram;  Surgeon: Marea Selinda RAMAN, MD;  Location: [facility];  Service: Cardiovascular;  Laterality: Left;   A/V FISTULAGRAM Left 08/01/2022   Procedure: A/V Fistulagram;  Surgeon: Marea Selinda RAMAN, MD;  Location: [facility];  Service: Cardiovascular;  Laterality: Left;   A/V FISTULAGRAM Left 05/01/2023   Procedure: A/V Fistulagram;  Surgeon: Marea Selinda RAMAN, MD;  Location: [facility];  Service: Cardiovascular;  Laterality: Left;   AV FISTULA PLACEMENT Left 12/06/2021   Procedure: INSERTION OF ARTERIOVENOUS (AV) GORE-TEX GRAFT ARM (BRACHIAL AXILLARY);  Surgeon: Marea Selinda RAMAN, MD;  Location: [facility];  Service: Vascular;  Laterality: Left;   CAPD INSERTION N/A 08/10/2020   Procedure: LAPAROSCOPIC INSERTION CONTINUOUS AMBULATORY PERITONEAL DIALYSIS  (CAPD) CATHETER;  Surgeon: Jordis Laneta FALCON, MD;  Location: [facility];  Service: General;  Laterality: N/A;   CAPD REMOVAL N/A 11/08/2021   Procedure: REMOVAL CONTINUOUS AMBULATORY PERITONEAL DIALYSIS  (CAPD) CATHETER;  Surgeon: Jordis Laneta FALCON, MD;  Location: [facility];  Service: General;  Laterality: N/A;   CAPD REVISION N/A 08/16/2021   Procedure: LAPAROSCOPIC REVISION CONTINUOUS AMBULATORY PERITONEAL DIALYSIS  (CAPD) CATHETER;  Surgeon: Jordis Laneta FALCON, MD;  Location: [facility];  Service: General;  Laterality: N/A;   COLONOSCOPY     2003, 2011   COLONOSCOPY WITH PROPOFOL  N/A 05/25/2015   Procedure: COLONOSCOPY  WITH PROPOFOL ;  Surgeon: Lamar ONEIDA Holmes, MD;  Location: [facility];  Service: Endoscopy;  Laterality: N/A;   COLONOSCOPY WITH PROPOFOL  N/A 05/12/2020   Procedure: COLONOSCOPY WITH PROPOFOL ;  Surgeon: Maryruth Ole ONEIDA, MD;  Location: [facility];  Service: Endoscopy;  Laterality: N/A;   DIALYSIS/PERMA CATHETER REMOVAL N/A 02/07/2022   Procedure: DIALYSIS/PERMA CATHETER REMOVAL;  Surgeon: Marea Selinda RAMAN, MD;  Location: [facility];  Service: Cardiovascular;  Laterality: N/A;   ESOPHAGOGASTRODUODENOSCOPY     2003, 2011   ESOPHAGOGASTRODUODENOSCOPY (EGD) WITH  PROPOFOL  N/A 05/12/2020   Procedure: ESOPHAGOGASTRODUODENOSCOPY (EGD) WITH PROPOFOL ;  Surgeon: Maryruth Ole DASEN, MD;  Location: [facility];  Service: Endoscopy;  Laterality: N/A;   FRACTURE SURGERY Left 1997   arm   XI ROBOTIC ASSISTED INGUINAL HERNIA REPAIR WITH MESH Right 08/23/2021   Procedure: XI ROBOTIC ASSISTED INGUINAL HERNIA REPAIR WITH MESH;  Surgeon: Jordis Laneta FALCON, MD;  Location: [facility];  Service: General;  Laterality: Right;    History reviewed. No pertinent family history.  Allergies  Allergen Reactions   Cefuroxime Rash    TOLERATED CEFAZOLIN    Tramadol Nausea Only and Other (See Comments)    Dizziness       Latest Ref Rng & Units 04/30/2023   10:41 AM 06/17/2022    2:04 PM 12/15/2021    5:00 AM  CBC  WBC 4.0 - 10.5 K/uL 5.2  5.6  8.0   Hemoglobin 13.0 - 17.0 g/dL 88.0  89.4  88.8   Hematocrit 39.0 - 52.0 % 38.3  32.2  34.7   Platelets 150 - 400 K/uL 173  184  220       CMP     Component Value Date/Time   NA 138 04/30/2023 1041   K 3.3 (L) 04/30/2023 1041   CL 97 (L) 04/30/2023 1041   CO2 27 04/30/2023 1041   GLUCOSE 83 04/30/2023 1041   BUN 24 (H) 04/30/2023 1041   CREATININE 5.43 (H) 04/30/2023 1041   CALCIUM  9.6 04/30/2023 1041   PROT 8.2 (H) 04/30/2023 1041   ALBUMIN 4.6 04/30/2023 1041   AST 22 04/30/2023 1041   ALT 12 04/30/2023 1041   ALKPHOS 109 04/30/2023 1041    BILITOT 1.9 (H) 04/30/2023 1041   GFRNONAA 11 (L) 04/30/2023 1041     No results found.     Assessment & Plan:   1. ESRD (end stage renal disease) (HCC) (Primary) Recommend:  The patient is doing well and currently has adequate dialysis access. The patient's dialysis center is not reporting any access issues. Flow pattern is stable when compared to the prior ultrasound.  The patient should have a duplex ultrasound of the dialysis access in 6 months. The patient will follow-up with me in the office after each ultrasound    2. Benign essential hypertension Continue antihypertensive medications as already ordered, these medications have been reviewed and there are no changes at this time.  3. Hypercholesterolemia  Continue statin as ordered and reviewed, no changes at this time  Current Outpatient Medications on File Prior to Visit  Medication Sig Dispense Refill   acetaminophen  (TYLENOL ) 325 MG tablet Take 650 mg by mouth every 6 (six) hours as needed for mild pain.     amLODipine  (NORVASC ) 5 MG tablet Take 5 mg by mouth every morning.     APIXABAN  (ELIQUIS ) VTE STARTER PACK (10MG  AND 5MG ) Take as directed on package: start with two-5mg  tablets twice daily for 7 days. (Already been on since 7/20.)  On day 7/27, switch to one-5mg  tablet twice daily. 1 each 0   atorvastatin  (LIPITOR) 10 MG tablet Take 10 mg by mouth every morning.     calcium  acetate (PHOSLO) 667 MG capsule Take 667 mg by mouth 3 (three) times daily.     Cholecalciferol 5000 units TABS Take 5,000 Units by mouth once a week.     dorzolamide -timolol  (COSOPT ) 22.3-6.8 MG/ML ophthalmic solution Place 1 drop into both eyes 2 (two) times daily.     fluticasone  (FLONASE ) 50 MCG/ACT nasal spray Place 2  sprays into both nostrils daily as needed for allergies or rhinitis.     furosemide  (LASIX ) 80 MG tablet Take 80 mg by mouth daily.     guaiFENesin -dextromethorphan  (ROBITUSSIN DM) 100-10 MG/5ML syrup Take 5 mLs by mouth  every 4 (four) hours as needed for cough. (Patient not taking: Reported on 05/16/2022) 118 mL 0   hydrALAZINE  (APRESOLINE ) 25 MG tablet Take 1 tablet (25 mg total) by mouth every 8 (eight) hours. 90 tablet 2   HYDROcodone -acetaminophen  (NORCO/VICODIN) 5-325 MG tablet Take 1-2 tablets by mouth every 6 (six) hours as needed for moderate pain. (Patient not taking: Reported on 05/02/2022) 12 tablet 0   lactulose  (CHRONULAC ) 10 GM/15ML solution Take 20 g by mouth 2 (two) times daily.     lidocaine -prilocaine  (EMLA ) cream Apply topically daily.     loratadine (CLARITIN) 10 MG tablet Take 10 mg by mouth daily.     losartan  (COZAAR ) 50 MG tablet Take 50 mg by mouth at bedtime.     metoprolol  tartrate (LOPRESSOR ) 25 MG tablet Take 25 mg by mouth every morning.     Netarsudil -Latanoprost  (ROCKLATAN ) 0.02-0.005 % SOLN Place 1 drop into both eyes every evening.     pantoprazole  (PROTONIX ) 20 MG tablet Take 20 mg by mouth every morning.     No current facility-administered medications on file prior to visit.    There are no Patient Instructions on file for this visit. No follow-ups on file.   Cashe Gatt E Elian Gloster, NP

## 2024-02-06 IMAGING — CR DG ABDOMEN 1V
1 series · 2 of 2 positions shown · non-contrast
Comparison: None.

CLINICAL DATA: End-stage renal disease.

EXAM:
ABDOMEN - 1 VIEW

[Series 1: dg abd 1 view · 0.14mm/px · 2 of 2 slices shown]
[im 1/2]
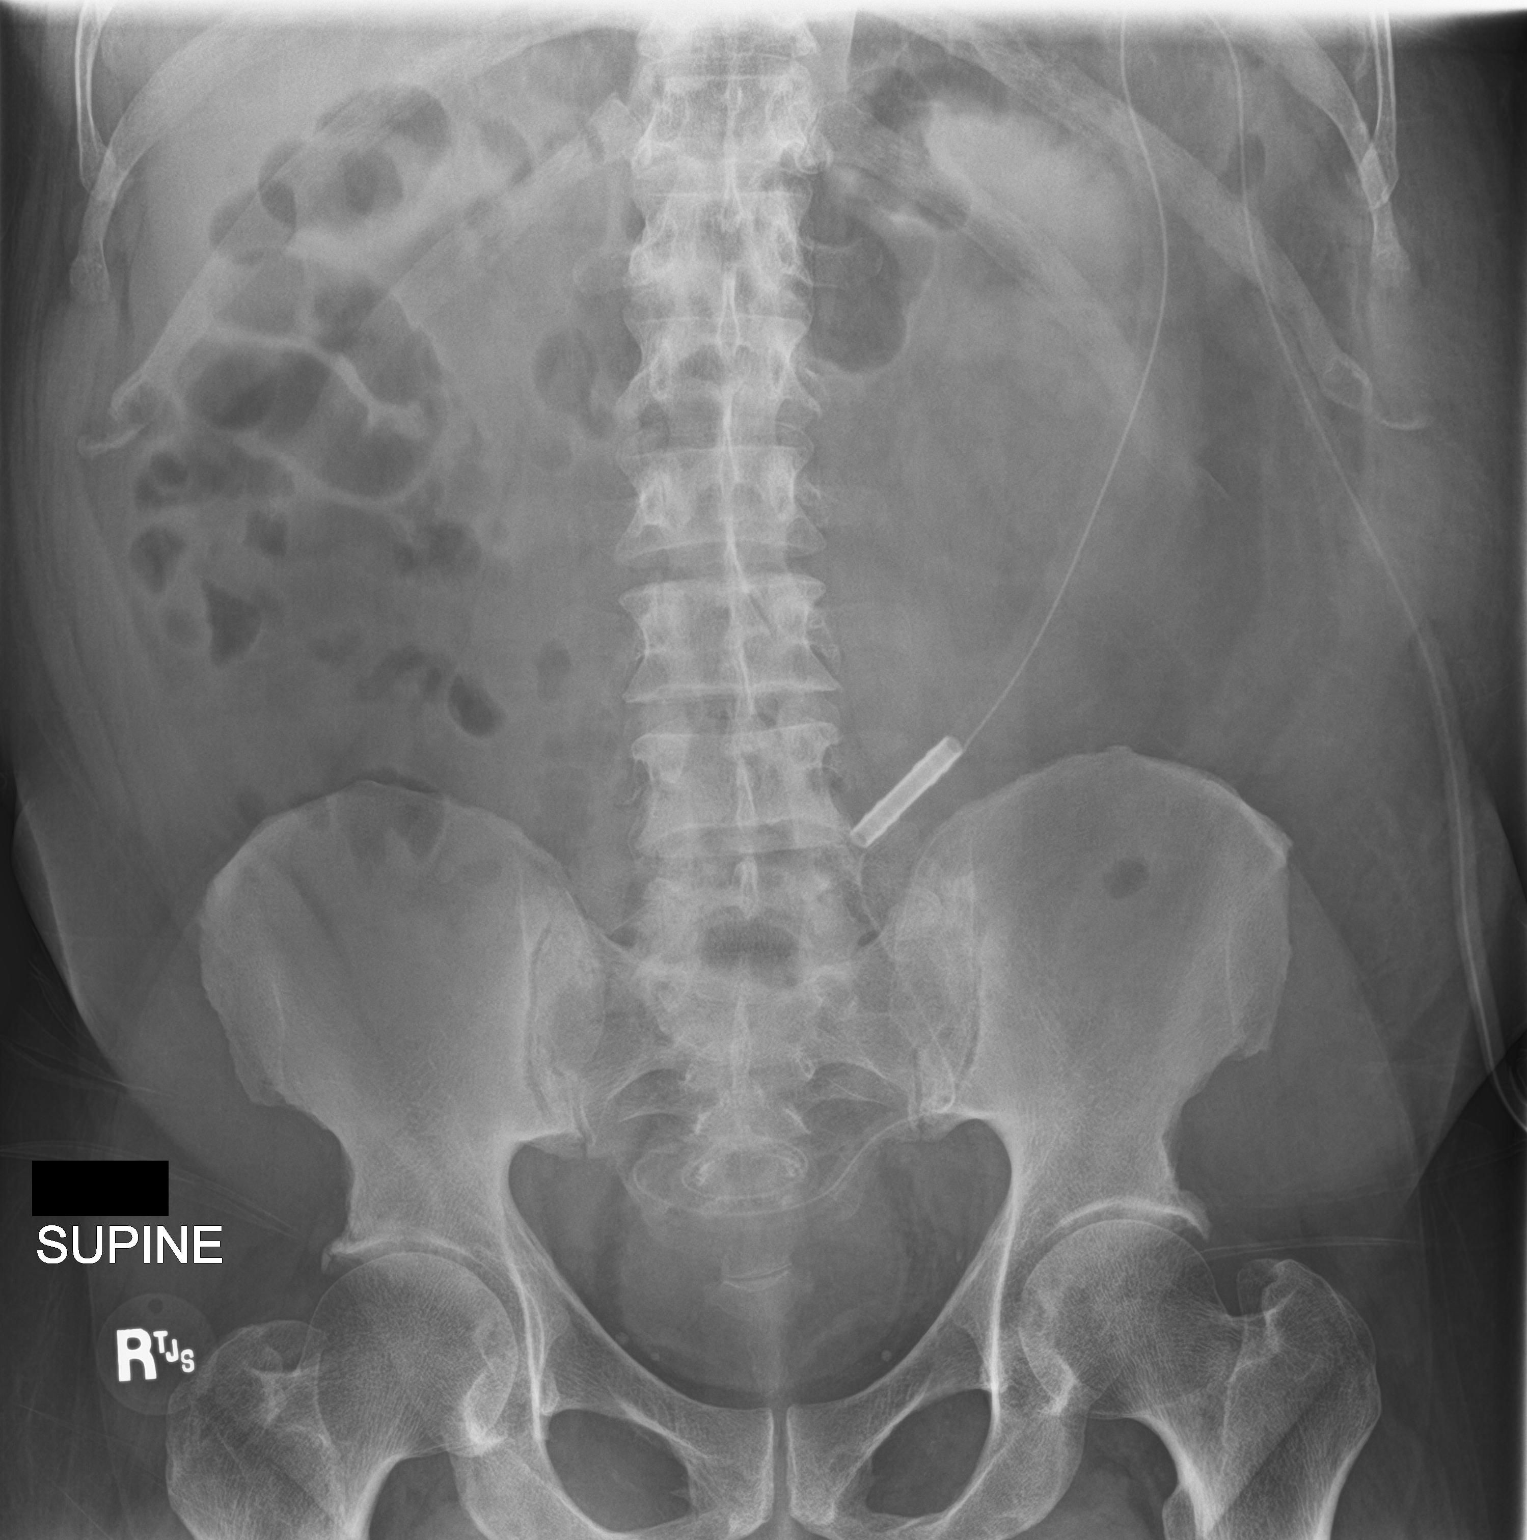
[im 2/2]
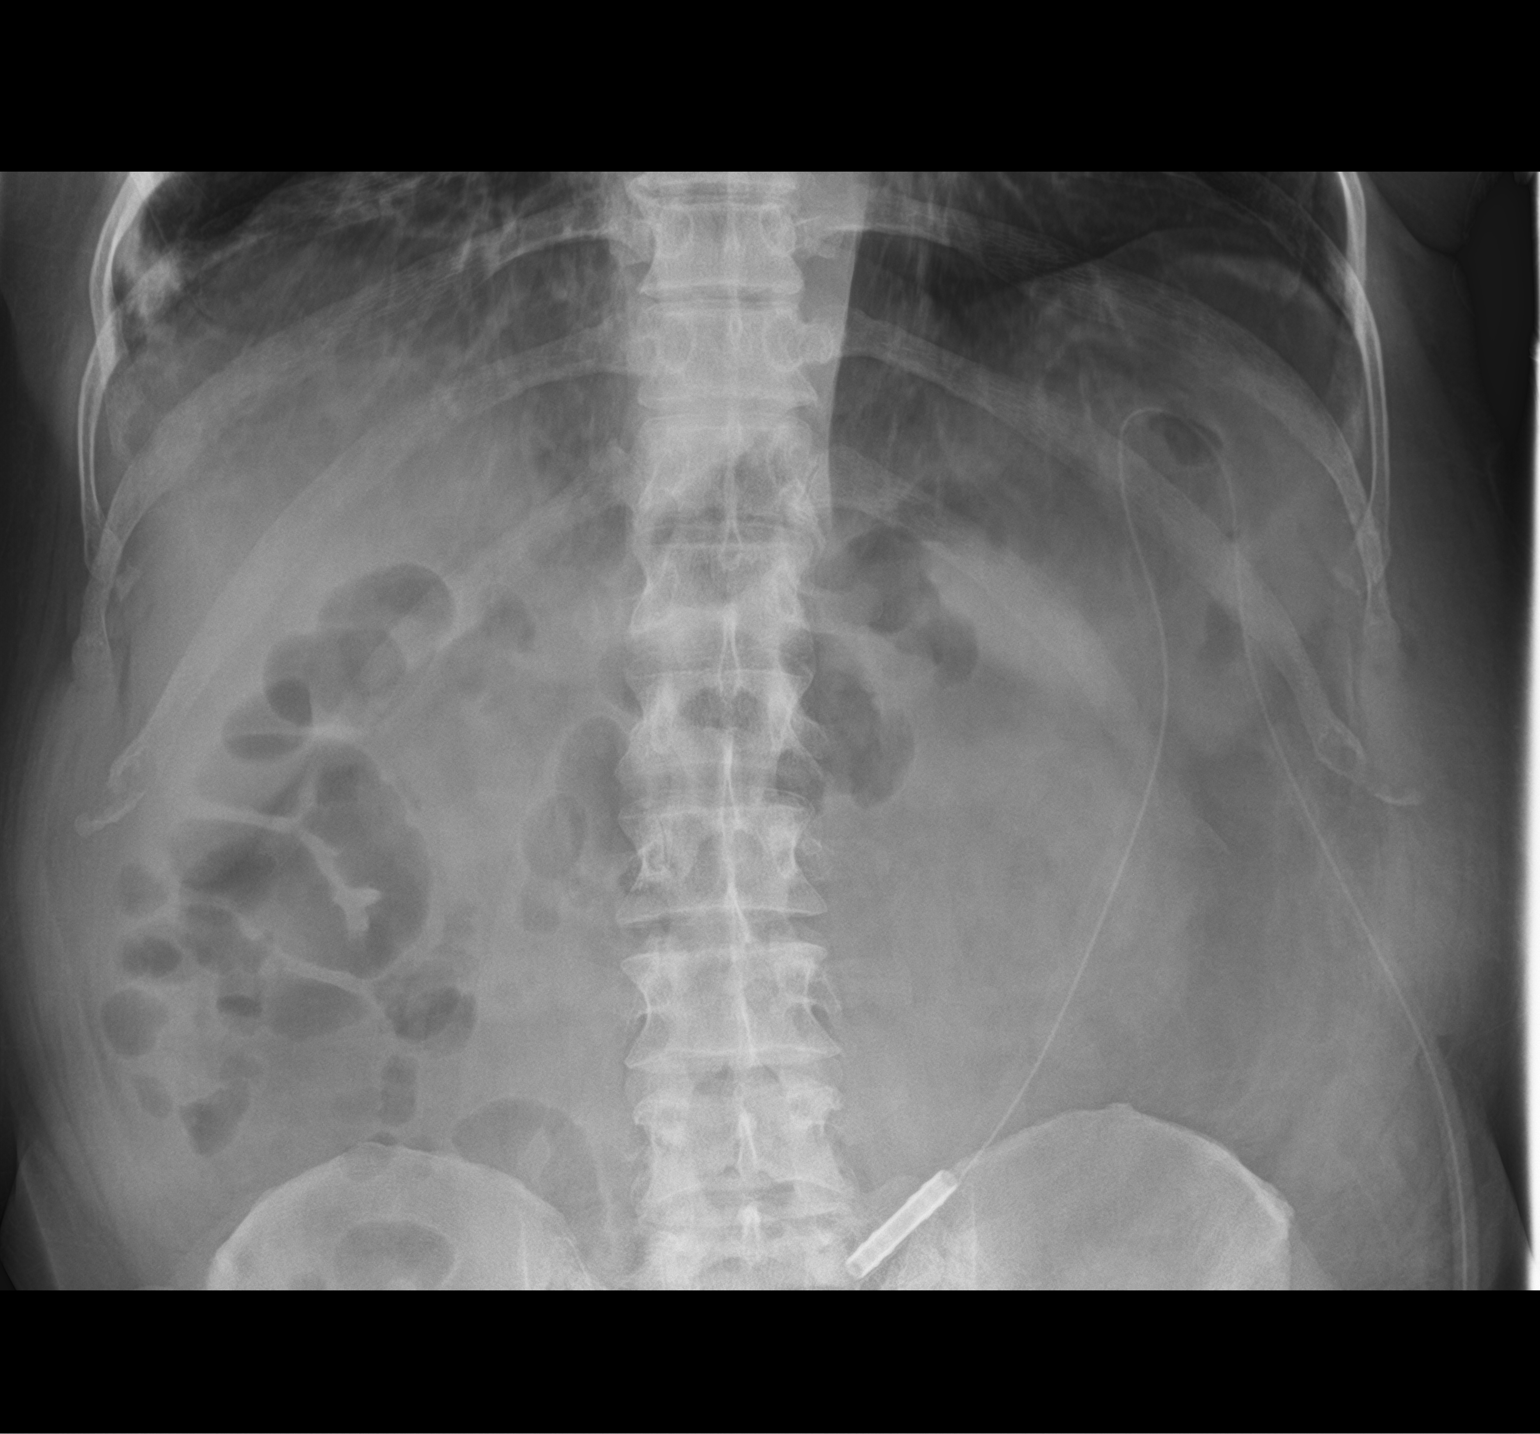

[2 of 2 positions shown; findings below may reference images not displayed]

FINDINGS: The bowel gas pattern is normal. Peritoneal dialysis catheter tip is
noted in left lower quadrant. No radio-opaque calculi or other
significant radiographic abnormality are seen.
IMPRESSION: Peritoneal dialysis catheter tip seen in left lower quadrant. No
abnormal bowel dilatation.

## 2024-04-09 IMAGING — CR DG ABDOMEN 1V
1 series · 2 of 2 positions shown · non-contrast
Comparison: 07/19/2021

CLINICAL DATA: Peritoneal dialysis catheter malfunction

EXAM:
ABDOMEN - 1 VIEW

[Series 1: dg abd 1 view · 0.14mm/px · 2 of 2 slices shown]
[im 1/2]
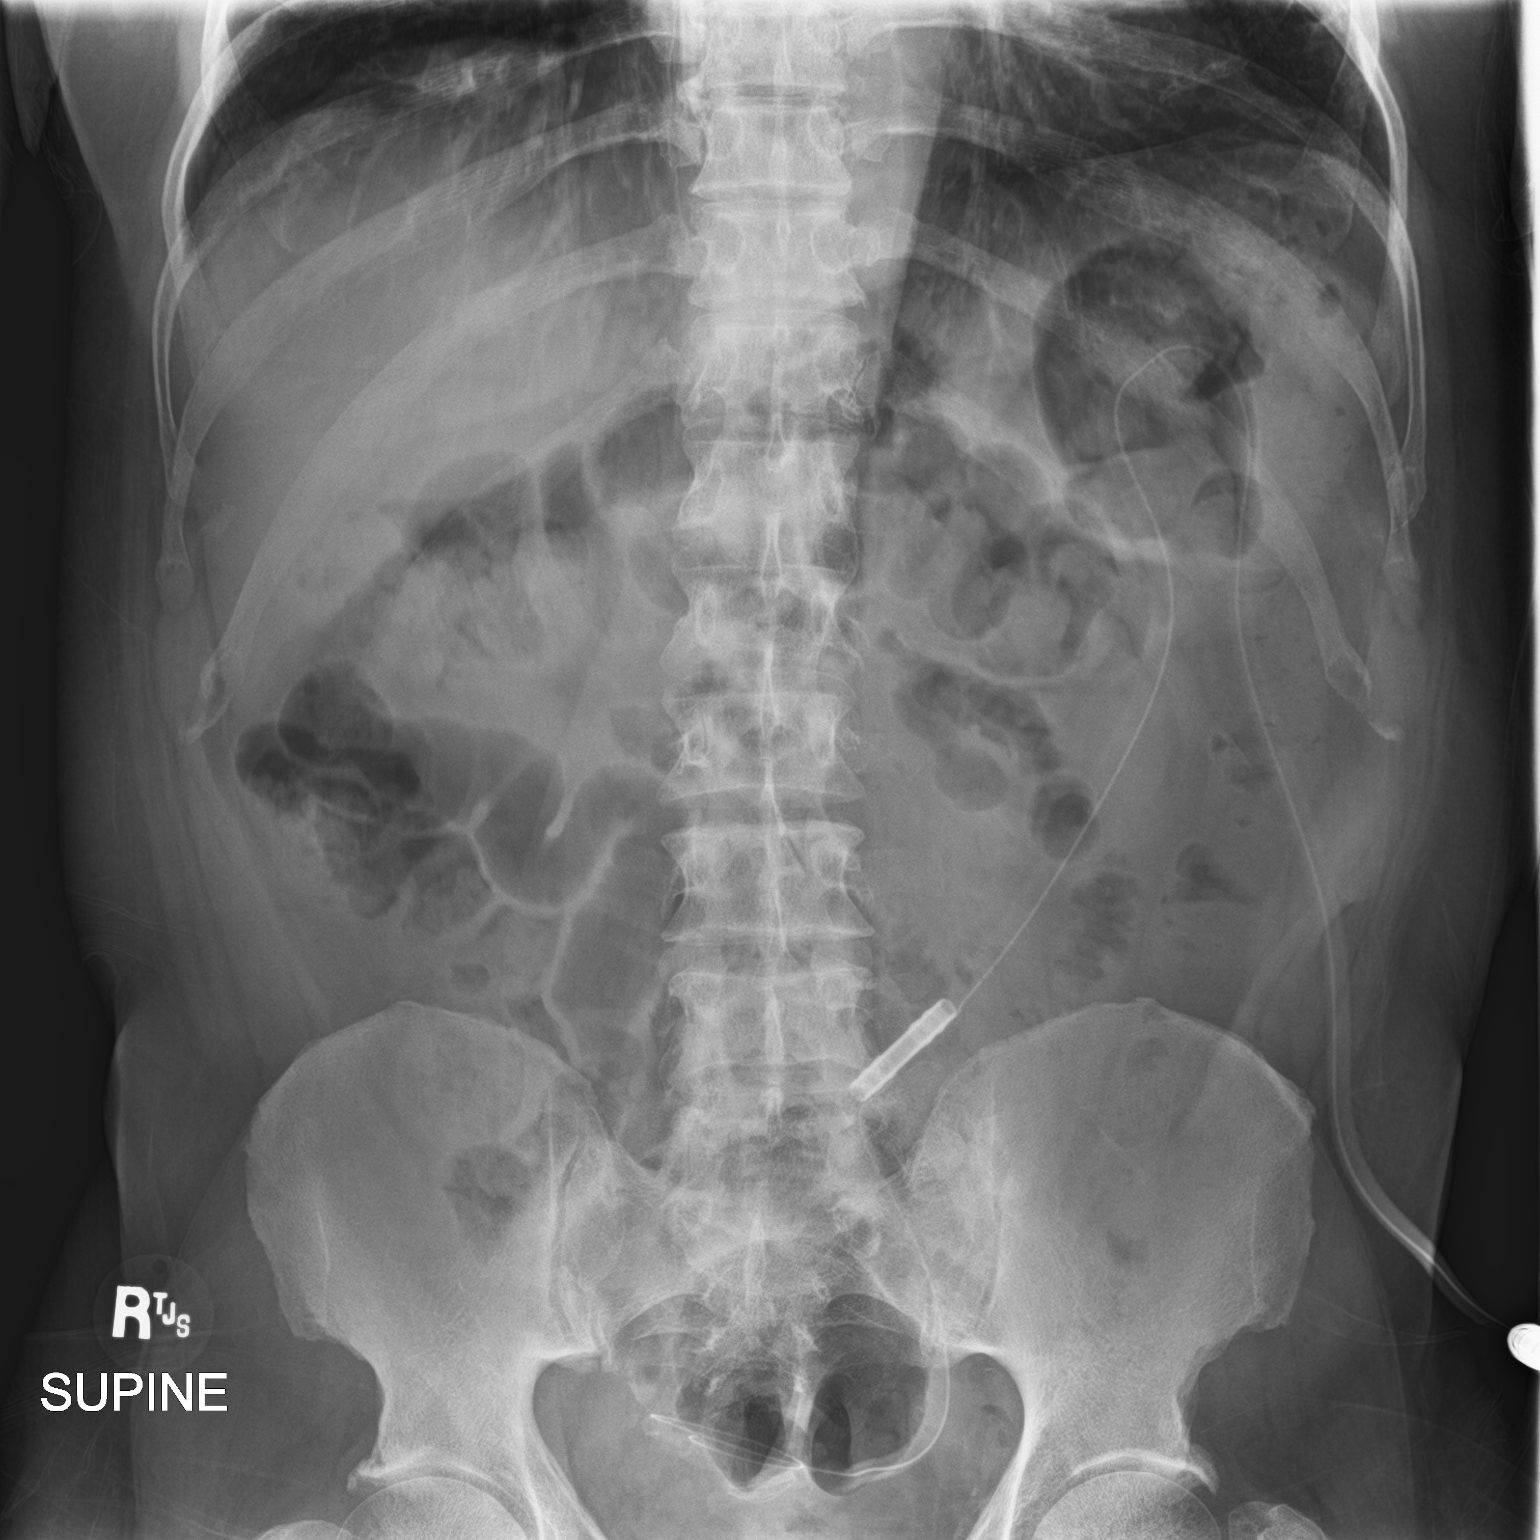
[im 2/2]
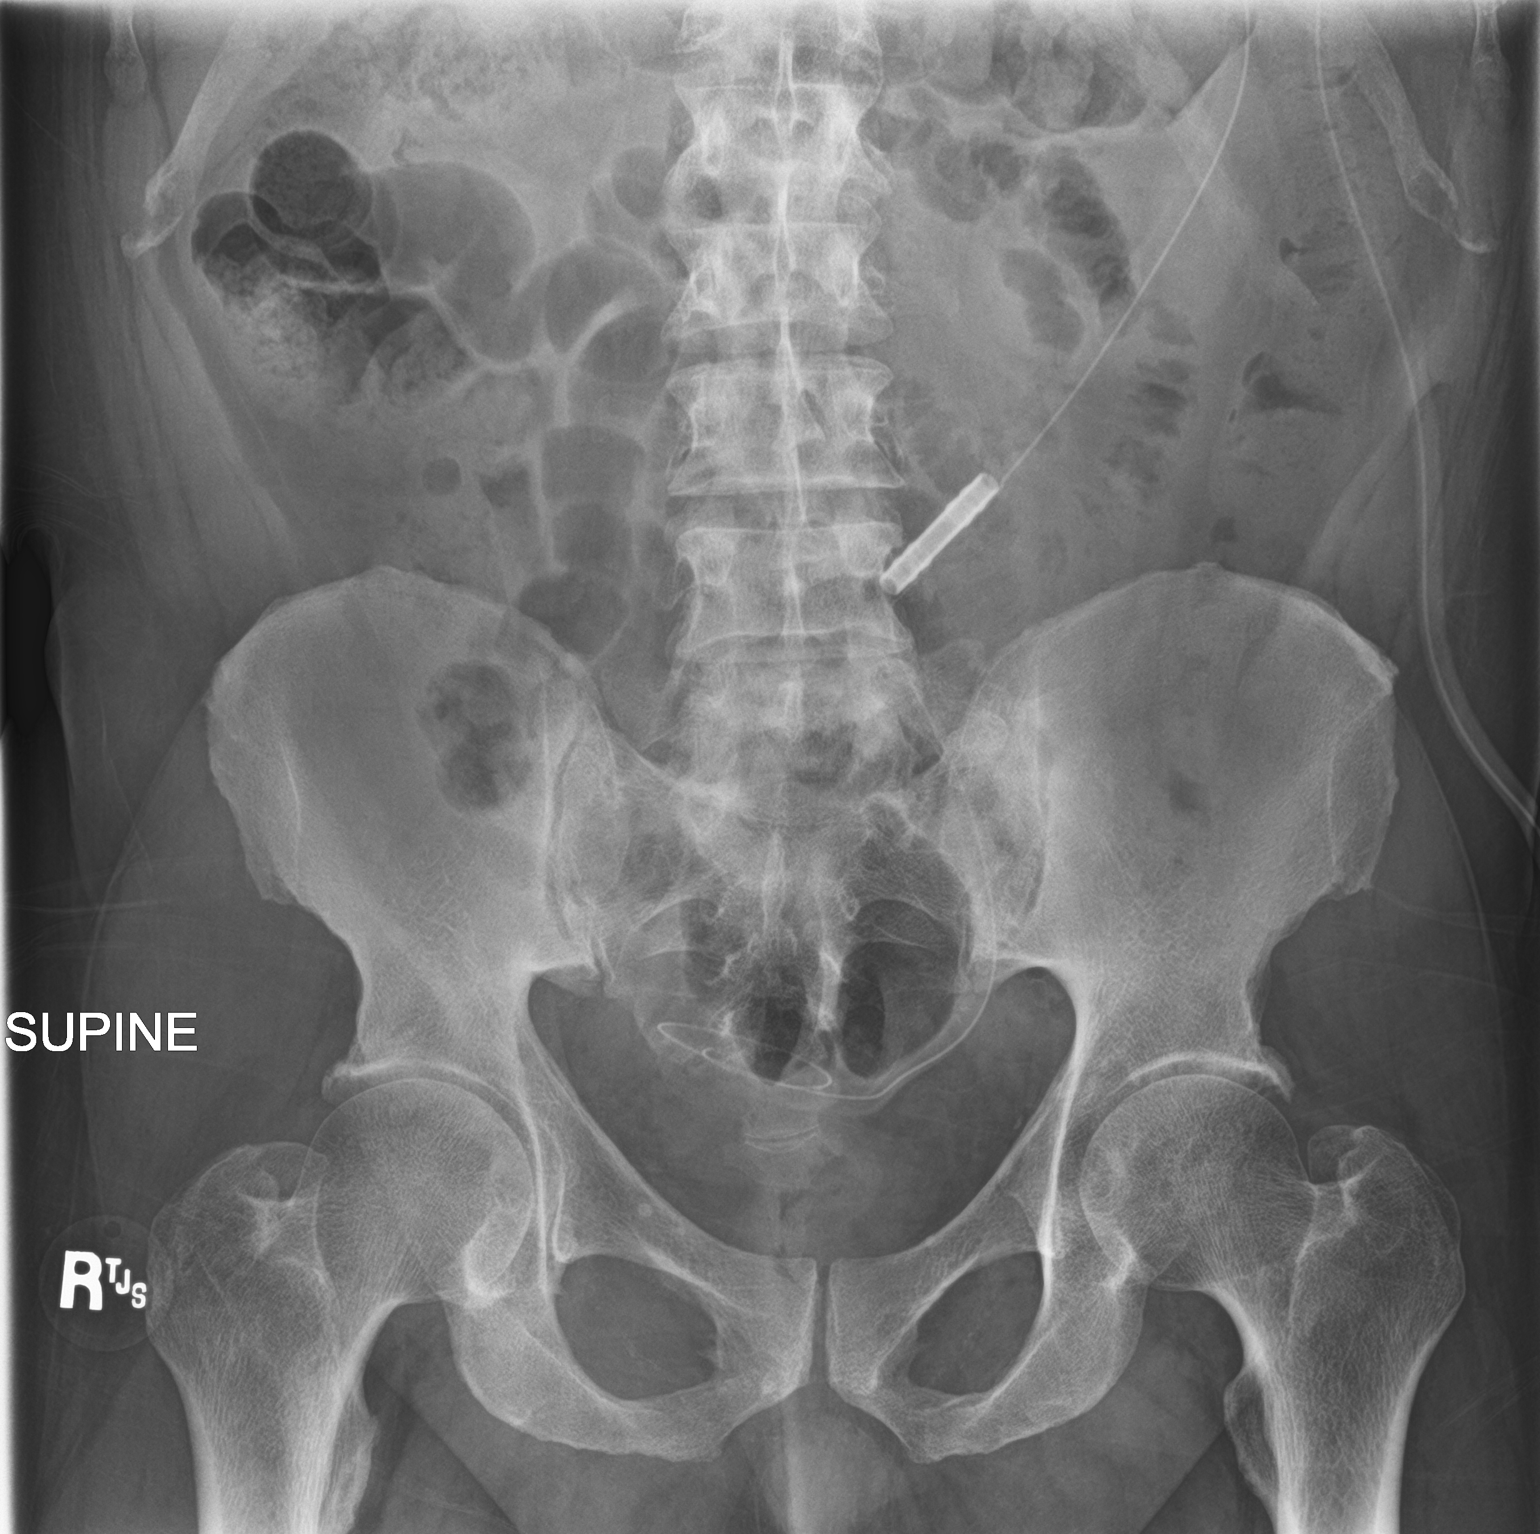

[2 of 2 positions shown; findings below may reference images not displayed]

FINDINGS: Nonobstructed gas pattern with moderate stool. Dialysis catheter is
coiled in the pelvis.
IMPRESSION: Dialysis catheter tip is coiled in the pelvis similar compared to
prior.

## 2024-07-29 ENCOUNTER — Encounter (INDEPENDENT_AMBULATORY_CARE_PROVIDER_SITE_OTHER)

## 2024-07-29 ENCOUNTER — Ambulatory Visit (INDEPENDENT_AMBULATORY_CARE_PROVIDER_SITE_OTHER): Admitting: Nurse Practitioner
